# Patient Record
Sex: Female | Born: 1985
Health system: Southern US, Community
[De-identification: ages and names within clinical notes are randomized; demographics above are authoritative.]

## PROBLEM LIST (undated history)

## (undated) DIAGNOSIS — E039 Hypothyroidism, unspecified: Secondary | ICD-10-CM

## (undated) DIAGNOSIS — F32A Depression, unspecified: Secondary | ICD-10-CM

## (undated) DIAGNOSIS — T4145XA Adverse effect of unspecified anesthetic, initial encounter: Secondary | ICD-10-CM

## (undated) DIAGNOSIS — T8859XA Other complications of anesthesia, initial encounter: Secondary | ICD-10-CM

## (undated) DIAGNOSIS — F419 Anxiety disorder, unspecified: Secondary | ICD-10-CM

## (undated) DIAGNOSIS — I10 Essential (primary) hypertension: Secondary | ICD-10-CM

## (undated) DIAGNOSIS — K219 Gastro-esophageal reflux disease without esophagitis: Secondary | ICD-10-CM

## (undated) DIAGNOSIS — F329 Major depressive disorder, single episode, unspecified: Secondary | ICD-10-CM

## (undated) DIAGNOSIS — Z87828 Personal history of other (healed) physical injury and trauma: Secondary | ICD-10-CM

## (undated) HISTORY — DX: Hypothyroidism, unspecified: E03.9

## (undated) HISTORY — DX: Personal history of other (healed) physical injury and trauma: Z87.828

## (undated) HISTORY — PX: LAPAROSCOPIC OVARIAN CYSTECTOMY: SUR786

## (undated) HISTORY — DX: Essential (primary) hypertension: I10

## (undated) HISTORY — PX: WISDOM TOOTH EXTRACTION: SHX21

---
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
## 2005-02-01 ENCOUNTER — Ambulatory Visit: Payer: Self-pay | Admitting: "Endocrinology

## 2005-02-20 ENCOUNTER — Ambulatory Visit: Payer: Self-pay | Admitting: "Endocrinology

## 2005-04-17 ENCOUNTER — Ambulatory Visit: Payer: Self-pay | Admitting: "Endocrinology

## 2005-05-24 ENCOUNTER — Other Ambulatory Visit: Admission: RE | Admit: 2005-05-24 | Discharge: 2005-05-24 | Payer: Self-pay | Admitting: Obstetrics and Gynecology

## 2005-07-24 ENCOUNTER — Ambulatory Visit: Payer: Self-pay | Admitting: "Endocrinology

## 2005-10-02 ENCOUNTER — Ambulatory Visit: Payer: Self-pay | Admitting: "Endocrinology

## 2012-12-11 ENCOUNTER — Emergency Department: Payer: Self-pay | Admitting: Emergency Medicine

## 2012-12-11 LAB — CBC
HGB: 14 g/dL (ref 12.0–16.0)
MCH: 27.8 pg (ref 26.0–34.0)
MCHC: 32.2 g/dL (ref 32.0–36.0)
RBC: 5.05 10*6/uL (ref 3.80–5.20)
RDW: 15.1 % — ABNORMAL HIGH (ref 11.5–14.5)

## 2012-12-11 LAB — COMPREHENSIVE METABOLIC PANEL
Albumin: 3.6 g/dL (ref 3.4–5.0)
Anion Gap: 10 (ref 7–16)
BUN: 15 mg/dL (ref 7–18)
Bilirubin,Total: 0.1 mg/dL — ABNORMAL LOW (ref 0.2–1.0)
Calcium, Total: 9.1 mg/dL (ref 8.5–10.1)
Chloride: 103 mmol/L (ref 98–107)
EGFR (African American): >60
Glucose: 125 mg/dL — ABNORMAL HIGH (ref 65–99)
Osmolality: 280 (ref 275–301)
Potassium: 3.5 mmol/L (ref 3.5–5.1)
SGOT(AST): 12 U/L — ABNORMAL LOW (ref 15–37)
SGPT (ALT): 19 U/L (ref 12–78)
Sodium: 139 mmol/L (ref 136–145)

## 2012-12-11 LAB — URINALYSIS, COMPLETE
Bilirubin,UR: NEGATIVE
Glucose,UR: NEGATIVE mg/dL (ref 0–75)
Nitrite: NEGATIVE
Protein: NEGATIVE
RBC,UR: 71 /HPF (ref 0–5)
Squamous Epithelial: 3
WBC UR: 2 /HPF (ref 0–5)

## 2012-12-11 LAB — PREGNANCY, URINE: Pregnancy Test, Urine: NEGATIVE m[IU]/mL

## 2012-12-14 LAB — COMPREHENSIVE METABOLIC PANEL
Alkaline Phosphatase: 86 U/L (ref 50–136)
BUN: 15 mg/dL (ref 7–18)
Bilirubin,Total: 0.3 mg/dL (ref 0.2–1.0)
Calcium, Total: 8.5 mg/dL (ref 8.5–10.1)
Chloride: 104 mmol/L (ref 98–107)
Co2: 18 mmol/L — ABNORMAL LOW (ref 21–32)
Glucose: 106 mg/dL — ABNORMAL HIGH (ref 65–99)
Osmolality: 269 (ref 275–301)
Potassium: 4.6 mmol/L (ref 3.5–5.1)
SGOT(AST): 34 U/L (ref 15–37)
SGPT (ALT): 19 U/L (ref 12–78)
Sodium: 134 mmol/L — ABNORMAL LOW (ref 136–145)
Total Protein: 8.4 g/dL — ABNORMAL HIGH (ref 6.4–8.2)

## 2014-05-26 LAB — OB RESULTS CONSOLE ABO/RH: RH TYPE: POSITIVE

## 2014-05-26 LAB — OB RESULTS CONSOLE GC/CHLAMYDIA
Chlamydia: NEGATIVE
GC PROBE AMP, GENITAL: NEGATIVE

## 2014-05-26 LAB — OB RESULTS CONSOLE RPR: RPR: NONREACTIVE

## 2014-05-26 LAB — OB RESULTS CONSOLE HIV ANTIBODY (ROUTINE TESTING): HIV: NONREACTIVE

## 2014-05-26 LAB — OB RESULTS CONSOLE RUBELLA ANTIBODY, IGM: Rubella: IMMUNE

## 2014-05-26 LAB — OB RESULTS CONSOLE HEPATITIS B SURFACE ANTIGEN: Hepatitis B Surface Ag: NEGATIVE

## 2014-05-26 LAB — OB RESULTS CONSOLE ANTIBODY SCREEN: Antibody Screen: NEGATIVE

## 2014-07-21 ENCOUNTER — Observation Stay: Payer: Self-pay | Admitting: Obstetrics and Gynecology

## 2014-07-21 LAB — CBC WITH DIFFERENTIAL/PLATELET
BASOS ABS: 0 10*3/uL (ref 0.0–0.1)
BASOS PCT: 0.5 %
EOS ABS: 0.2 10*3/uL (ref 0.0–0.7)
Eosinophil %: 1.7 %
HCT: 32.2 % — ABNORMAL LOW (ref 35.0–47.0)
HGB: 10.5 g/dL — AB (ref 12.0–16.0)
LYMPHS PCT: 16.5 %
Lymphocyte #: 1.6 10*3/uL (ref 1.0–3.6)
MCH: 29.3 pg (ref 26.0–34.0)
MCHC: 32.6 g/dL (ref 32.0–36.0)
MCV: 90 fL (ref 80–100)
Monocyte #: 0.7 x10 3/mm (ref 0.2–0.9)
Monocyte %: 7.2 %
NEUTROS PCT: 74.1 %
Neutrophil #: 7.1 10*3/uL — ABNORMAL HIGH (ref 1.4–6.5)
PLATELETS: 251 10*3/uL (ref 150–440)
RBC: 3.57 10*6/uL — ABNORMAL LOW (ref 3.80–5.20)
RDW: 15.7 % — ABNORMAL HIGH (ref 11.5–14.5)
WBC: 9.6 10*3/uL (ref 3.6–11.0)

## 2014-07-21 LAB — URINALYSIS, COMPLETE
BILIRUBIN, UR: NEGATIVE
BLOOD: NEGATIVE
GLUCOSE, UR: NEGATIVE mg/dL (ref 0–75)
KETONE: NEGATIVE
Leukocyte Esterase: NEGATIVE
Nitrite: NEGATIVE
Ph: 6 (ref 4.5–8.0)
Protein: NEGATIVE
RBC,UR: 1 /HPF (ref 0–5)
Specific Gravity: 1.004 (ref 1.003–1.030)
Squamous Epithelial: 3
WBC UR: 2 /HPF (ref 0–5)

## 2014-07-21 LAB — COMPREHENSIVE METABOLIC PANEL
ALBUMIN: 2.6 g/dL — AB (ref 3.4–5.0)
ALT: 16 U/L
Alkaline Phosphatase: 74 U/L
Anion Gap: 7 (ref 7–16)
BUN: 4 mg/dL — ABNORMAL LOW (ref 7–18)
Bilirubin,Total: 0.3 mg/dL (ref 0.2–1.0)
CALCIUM: 8.2 mg/dL — AB (ref 8.5–10.1)
Chloride: 110 mmol/L — ABNORMAL HIGH (ref 98–107)
Co2: 23 mmol/L (ref 21–32)
Creatinine: 0.51 mg/dL — ABNORMAL LOW (ref 0.60–1.30)
EGFR (Non-African Amer.): >60
GLUCOSE: 80 mg/dL (ref 65–99)
Osmolality: 275 (ref 275–301)
Potassium: 4 mmol/L (ref 3.5–5.1)
SGOT(AST): 19 U/L (ref 15–37)
Sodium: 140 mmol/L (ref 136–145)
Total Protein: 6.6 g/dL (ref 6.4–8.2)

## 2014-07-21 LAB — LIPASE, BLOOD: Lipase: 49 U/L — ABNORMAL LOW (ref 73–393)

## 2014-07-21 LAB — AMYLASE: Amylase: 25 U/L (ref 25–115)

## 2014-08-02 ENCOUNTER — Other Ambulatory Visit (HOSPITAL_COMMUNITY): Payer: Self-pay | Admitting: Obstetrics and Gynecology

## 2014-08-02 DIAGNOSIS — O10912 Unspecified pre-existing hypertension complicating pregnancy, second trimester: Secondary | ICD-10-CM

## 2014-08-03 ENCOUNTER — Ambulatory Visit (HOSPITAL_COMMUNITY)
Admission: RE | Admit: 2014-08-03 | Discharge: 2014-08-03 | Disposition: A | Discharge status: Home / Self Care | Payer: 59 | Source: Ambulatory Visit | Attending: Obstetrics and Gynecology | Admitting: Obstetrics and Gynecology

## 2014-08-03 ENCOUNTER — Encounter (HOSPITAL_COMMUNITY): Payer: Self-pay

## 2014-08-03 VITALS — BP 133/72 | HR 80 | Wt 292.0 lb

## 2014-08-03 DIAGNOSIS — O99212 Obesity complicating pregnancy, second trimester: Secondary | ICD-10-CM | POA: Insufficient documentation

## 2014-08-03 DIAGNOSIS — Z3A23 23 weeks gestation of pregnancy: Secondary | ICD-10-CM | POA: Diagnosis not present

## 2014-08-03 DIAGNOSIS — O10012 Pre-existing essential hypertension complicating pregnancy, second trimester: Secondary | ICD-10-CM | POA: Diagnosis present

## 2014-08-03 DIAGNOSIS — O1003 Pre-existing essential hypertension complicating the puerperium: Secondary | ICD-10-CM | POA: Diagnosis not present

## 2014-08-03 DIAGNOSIS — O10912 Unspecified pre-existing hypertension complicating pregnancy, second trimester: Secondary | ICD-10-CM

## 2014-08-03 HISTORY — DX: Essential (primary) hypertension: I10

## 2014-08-03 IMAGING — US US OB DETAIL+14 WK
1 series · 12 of 28 positions shown · non-contrast
Comparison: none

[Series 1: us ob detail+14 wk · 0.11mm/px · 12 of 77 slices shown]
[im 3/77]
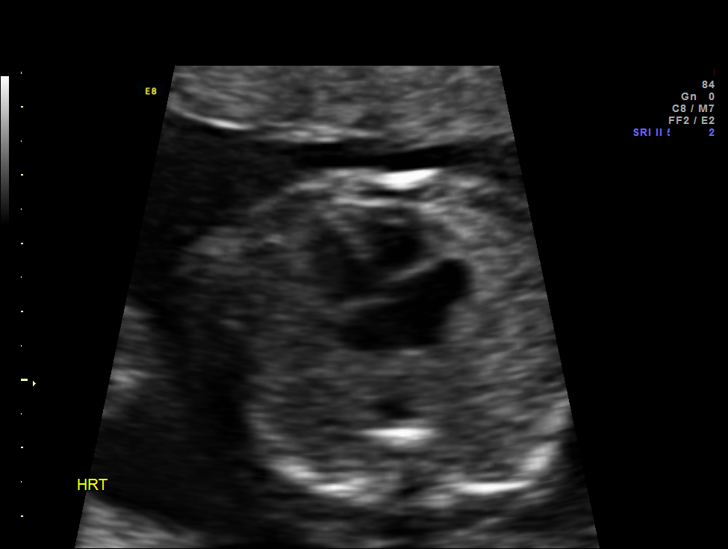
[im 9/77]
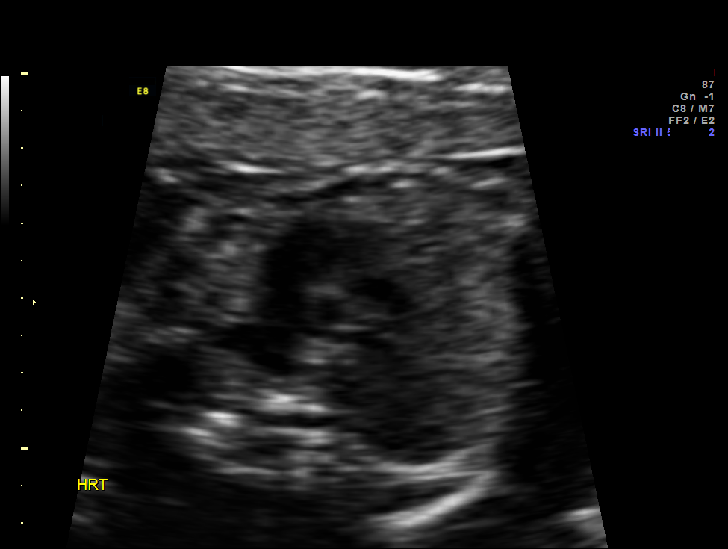
[im 15/77]
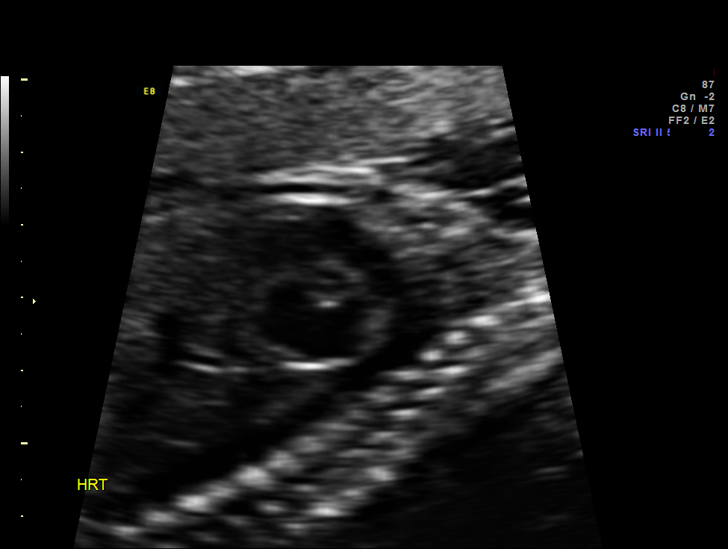
[im 23/77]
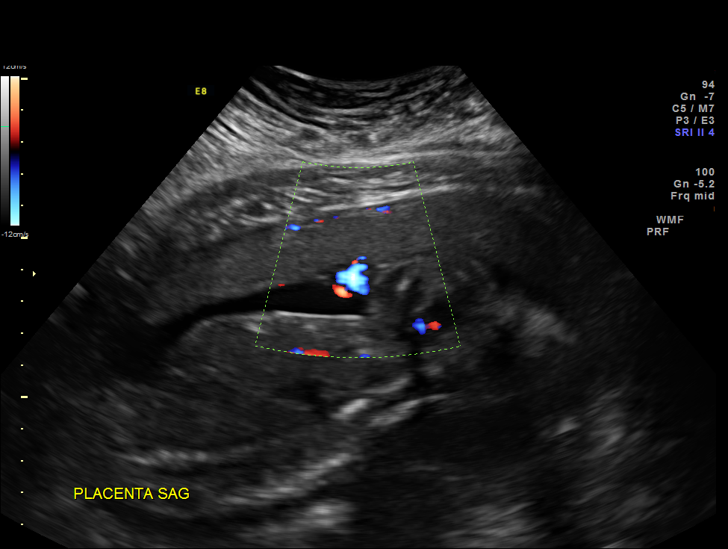
[im 29/77]
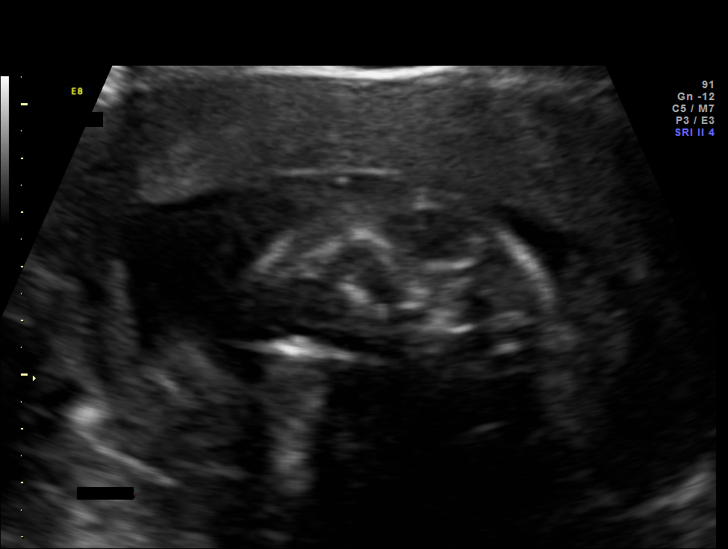
[im 34/77]
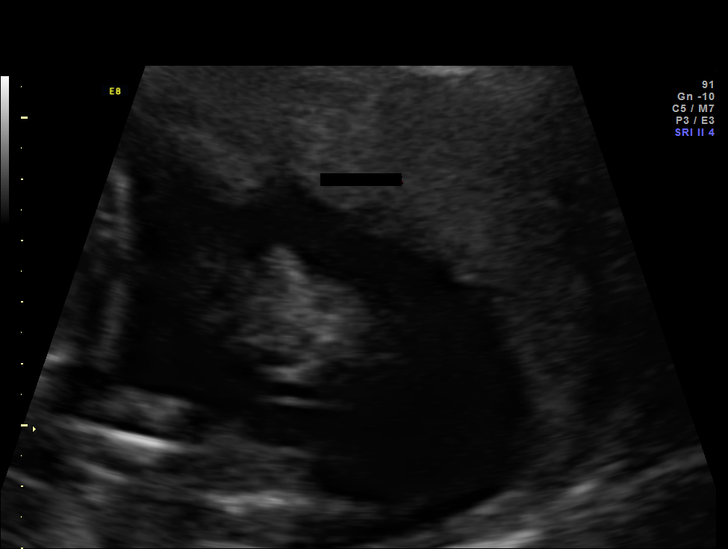
[im 43/77]
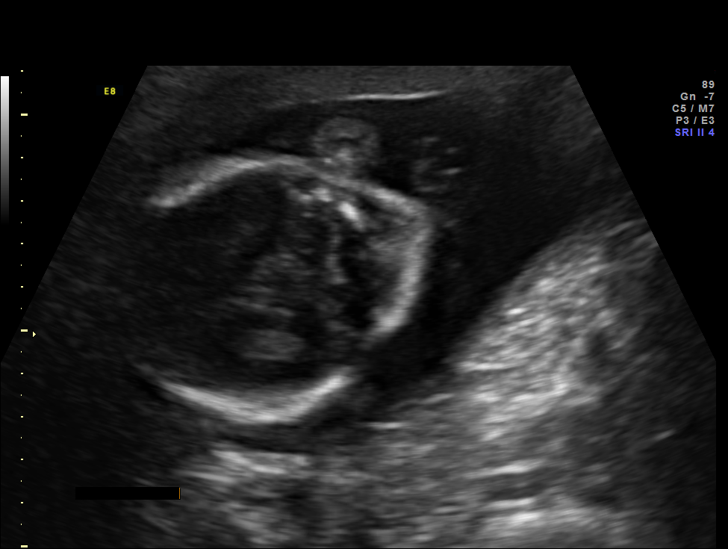
[im 48/77]
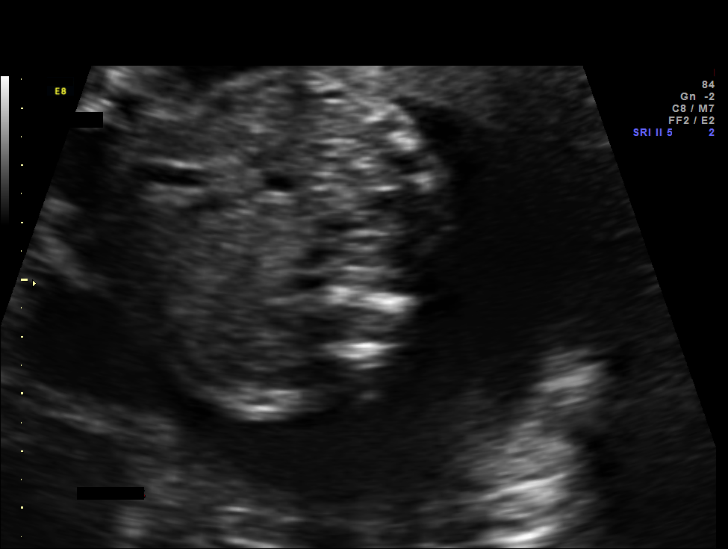
[im 54/77]
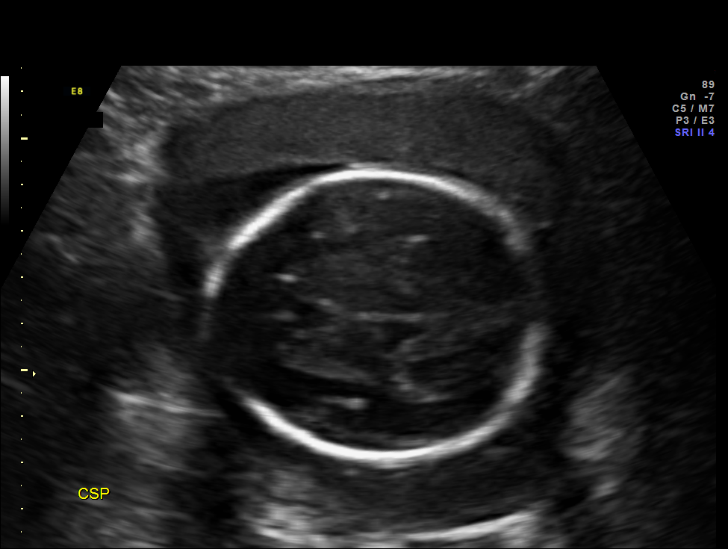
[im 62/77]
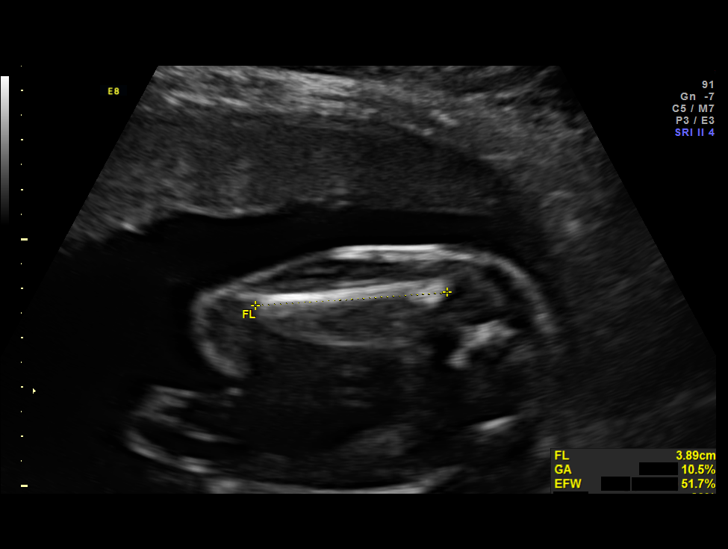
[im 68/77]
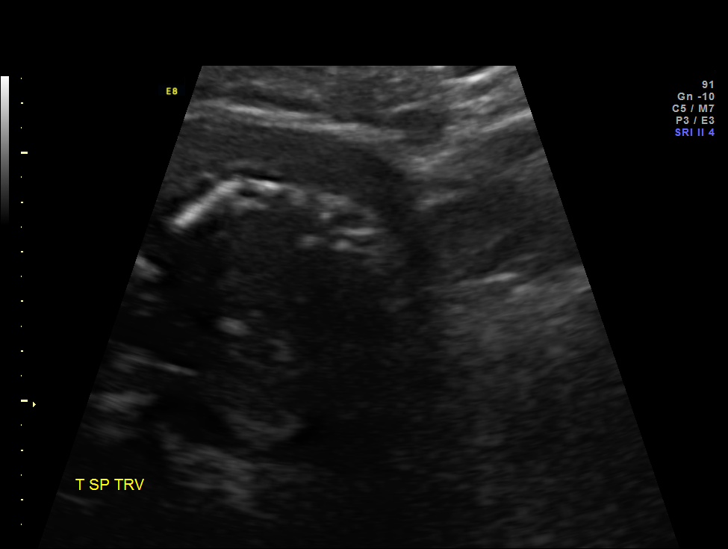
[im 74/77]
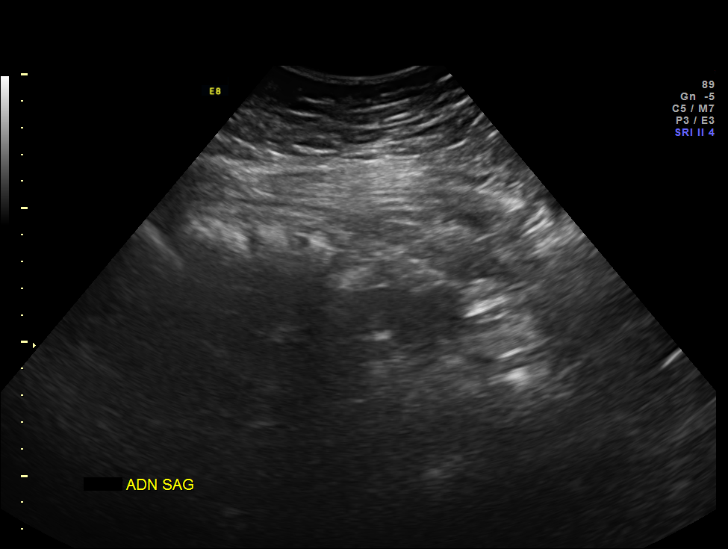

[12 of 28 positions shown; findings below may reference images not displayed]

OBSTETRICS REPORT
                      (Signed Final [DATE] [DATE])

Service(s) Provided

 US OB DETAIL + 14 WK                                  76811.0
Indications

 Detailed fetal anatomic survey                        Z36
 Hypertension - Chronic/Pre-existing (on labetalol)    [D3]
 Obesity complicating pregnancy                        [D3] [D3]
 23 weeks gestation of pregnancy
Fetal Evaluation

 Num Of Fetuses:    1
 Fetal Heart Rate:  159                          bpm
 Cardiac Activity:  Observed
 Presentation:      Cephalic
 Placenta:          Anterior, above cervical os
 P. Cord            Visualized, central
 Insertion:

 Amniotic Fluid
 AFI FV:      Subjectively within normal limits
                                             Larg Pckt:     5.0  cm
Biometry

 BPD:     60.9  mm     G. Age:  24w 5d                CI:        74.94   70 - 86
                                                      FL/HC:      17.6   18.7 -

 HC:     223.2  mm     G. Age:  24w 2d       64  %    HC/AC:      1.14   1.05 -

 AC:     196.2  mm     G. Age:  24w 2d       64  %    FL/BPD:     64.5   71 - 87
 FL:      39.3  mm     G. Age:  22w 4d       14  %    FL/AC:      20.0   20 - 24
 HUM:       41  mm     G. Age:  24w 6d       71  %
 CER:     25.5  mm     G. Age:  23w 4d       49  %

 Est. FW:     621  gm      1 lb 6 oz     55  %
Gestational Age

 U/S Today:     24w 0d                                        EDD:   [DATE]
 Best:          23w 4d     Det. By:  Early Ultrasound         EDD:   [DATE]
                                     ([DATE])
Anatomy
 Cranium:          Appears normal         Aortic Arch:      Appears normal
 Fetal Cavum:      Appears normal         Ductal Arch:      Appears normal
 Ventricles:       Appears normal         Diaphragm:        Appears normal
 Choroid Plexus:   Appears normal         Stomach:          Appears normal, left
                                                            sided
 Cerebellum:       Appears normal         Abdomen:          Appears normal
 Posterior Fossa:  Appears normal         Abdominal Wall:   Appears nml (cord
                                                            insert, abd wall)
 Nuchal Fold:      Not applicable (>20    Cord Vessels:     Appears normal (3
                   wks GA)                                  vessel cord)
 Face:             Appears normal         Kidneys:          Appear normal
                   (orbits and profile)
 Lips:             Appears normal         Bladder:          Appears normal
 Heart:            Echogenic focus        Spine:            Appears normal
                   in LV; limited
 RVOT:             Appears normal         Lower             Visualized
                                          Extremities:
 LVOT:             Appears normal         Upper             Visualized
                                          Extremities:

 Other:  Fetus appears to be a female. Nasal bone visualized. Technically
         difficult due to maternal habitus and fetal position.
Cervix Uterus Adnexa

 Cervical Length:    3        cm

 Cervix:       Normal appearance by transabdominal scan.

 Adnexa:     No abnormality visualized.
Comments

 An echogenic focus was seen in the left cardiac ventricle.
 This is felt to represent a calcified papillary muscle, and is not
 associated with structural or functional cardiac abnormalities.
 Although an echogenic cardiac focus may be associated with
 an increased risk of Down syndrome, this risk is felt to be
 minimal, especially when it is seen as an isolated finding.
Impression

 Single IUP at 23w 4d
 Hx of Chronic hypertension on Labetalol
 An echogenic intracardiac focus was noted (see comments)
 Somewhat limited views of the fetal heart obtained due to
 maternal body habitus and fetal position
 Anterior placenta without previa
 Normal amniotic fluid volume
Recommendations

 Recommend follow-up ultrasound examination in 4 weeks for
 growth and to reevaluate the fetal heart

## 2014-08-15 ENCOUNTER — Encounter (HOSPITAL_COMMUNITY): Payer: Self-pay

## 2014-08-23 ENCOUNTER — Other Ambulatory Visit (HOSPITAL_COMMUNITY): Payer: Self-pay

## 2014-08-25 ENCOUNTER — Observation Stay: Payer: Self-pay | Admitting: Obstetrics & Gynecology

## 2014-08-25 LAB — COMPREHENSIVE METABOLIC PANEL
ALBUMIN: 2.4 g/dL — AB (ref 3.4–5.0)
ALT: 13 U/L — AB
AST: 8 U/L — AB (ref 15–37)
Alkaline Phosphatase: 77 U/L
Anion Gap: 7 (ref 7–16)
BILIRUBIN TOTAL: 0.2 mg/dL (ref 0.2–1.0)
BUN: 5 mg/dL — ABNORMAL LOW (ref 7–18)
CALCIUM: 7.9 mg/dL — AB (ref 8.5–10.1)
CHLORIDE: 111 mmol/L — AB (ref 98–107)
Co2: 22 mmol/L (ref 21–32)
Creatinine: 0.53 mg/dL — ABNORMAL LOW (ref 0.60–1.30)
EGFR (Non-African Amer.): >60
Glucose: 87 mg/dL (ref 65–99)
OSMOLALITY: 276 (ref 275–301)
POTASSIUM: 3.6 mmol/L (ref 3.5–5.1)
Sodium: 140 mmol/L (ref 136–145)
Total Protein: 6.3 g/dL — ABNORMAL LOW (ref 6.4–8.2)

## 2014-08-25 LAB — URINALYSIS, COMPLETE
BLOOD: NEGATIVE
Bilirubin,UR: NEGATIVE
Glucose,UR: NEGATIVE mg/dL (ref 0–75)
KETONE: NEGATIVE
Leukocyte Esterase: NEGATIVE
Nitrite: NEGATIVE
Ph: 6 (ref 4.5–8.0)
RBC,UR: 1 /HPF (ref 0–5)
Specific Gravity: 1.011 (ref 1.003–1.030)
Squamous Epithelial: 3

## 2014-08-25 LAB — CBC WITH DIFFERENTIAL/PLATELET
BASOS PCT: 0.5 %
Basophil #: 0 10*3/uL (ref 0.0–0.1)
EOS ABS: 0.1 10*3/uL (ref 0.0–0.7)
EOS PCT: 0.9 %
HCT: 33 % — ABNORMAL LOW (ref 35.0–47.0)
HGB: 11 g/dL — AB (ref 12.0–16.0)
LYMPHS ABS: 0.9 10*3/uL — AB (ref 1.0–3.6)
Lymphocyte %: 12.7 %
MCH: 30.4 pg (ref 26.0–34.0)
MCHC: 33.4 g/dL (ref 32.0–36.0)
MCV: 91 fL (ref 80–100)
Monocyte #: 0.6 x10 3/mm (ref 0.2–0.9)
Monocyte %: 8.8 %
NEUTROS PCT: 77.1 %
Neutrophil #: 5.6 10*3/uL (ref 1.4–6.5)
Platelet: 242 10*3/uL (ref 150–440)
RBC: 3.62 10*6/uL — ABNORMAL LOW (ref 3.80–5.20)
RDW: 14.9 % — AB (ref 11.5–14.5)
WBC: 7.3 10*3/uL (ref 3.6–11.0)

## 2014-08-25 LAB — LIPASE, BLOOD: LIPASE: 50 U/L — AB (ref 73–393)

## 2014-08-25 LAB — HCG, QUANTITATIVE, PREGNANCY: BETA HCG, QUANT.: 3796 m[IU]/mL — AB

## 2014-08-25 LAB — PREGNANCY, URINE: PREGNANCY TEST, URINE: POSITIVE m[IU]/mL
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
## 2014-08-31 ENCOUNTER — Encounter (HOSPITAL_COMMUNITY): Payer: Self-pay

## 2014-08-31 ENCOUNTER — Other Ambulatory Visit (HOSPITAL_COMMUNITY): Payer: Self-pay | Admitting: Maternal and Fetal Medicine

## 2014-08-31 ENCOUNTER — Ambulatory Visit (HOSPITAL_COMMUNITY)
Admission: RE | Admit: 2014-08-31 | Discharge: 2014-08-31 | Disposition: A | Discharge status: Home / Self Care | Payer: 59 | Source: Ambulatory Visit | Attending: Obstetrics and Gynecology | Admitting: Obstetrics and Gynecology

## 2014-08-31 DIAGNOSIS — O99212 Obesity complicating pregnancy, second trimester: Secondary | ICD-10-CM | POA: Insufficient documentation

## 2014-08-31 DIAGNOSIS — O99210 Obesity complicating pregnancy, unspecified trimester: Secondary | ICD-10-CM | POA: Insufficient documentation

## 2014-08-31 DIAGNOSIS — O10912 Unspecified pre-existing hypertension complicating pregnancy, second trimester: Secondary | ICD-10-CM

## 2014-08-31 DIAGNOSIS — Z3A27 27 weeks gestation of pregnancy: Secondary | ICD-10-CM | POA: Insufficient documentation

## 2014-08-31 DIAGNOSIS — O10012 Pre-existing essential hypertension complicating pregnancy, second trimester: Secondary | ICD-10-CM | POA: Diagnosis not present

## 2014-08-31 DIAGNOSIS — O10919 Unspecified pre-existing hypertension complicating pregnancy, unspecified trimester: Secondary | ICD-10-CM | POA: Insufficient documentation

## 2014-08-31 IMAGING — US US OB FOLLOW-UP
1 series · 12 of 28 positions shown · non-contrast
Comparison: none

[Series 1: us ob follow-up · 0.33mm/px · 12 of 28 slices shown]
[im 2/28]
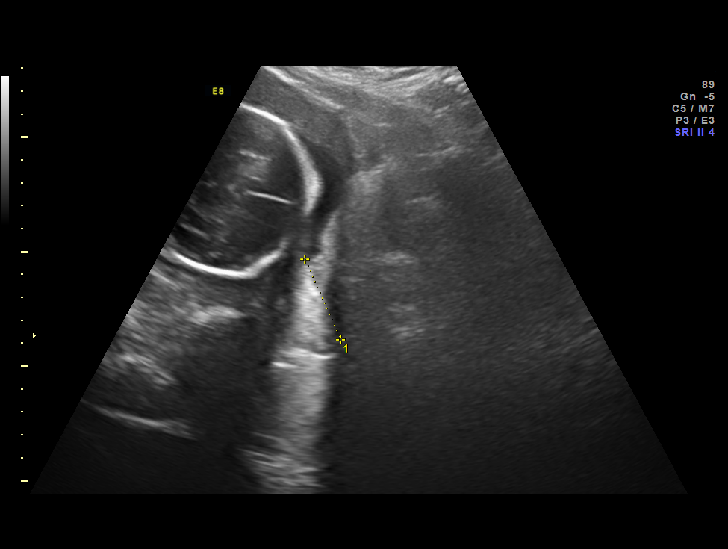
[im 4/28]
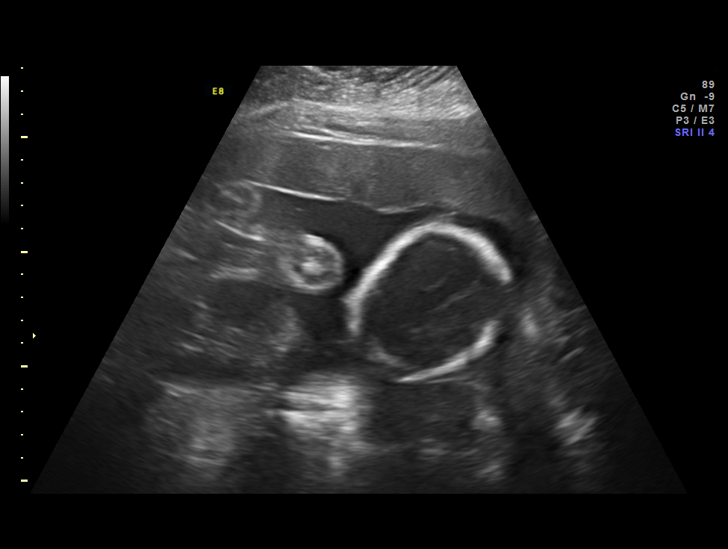
[im 6/28]
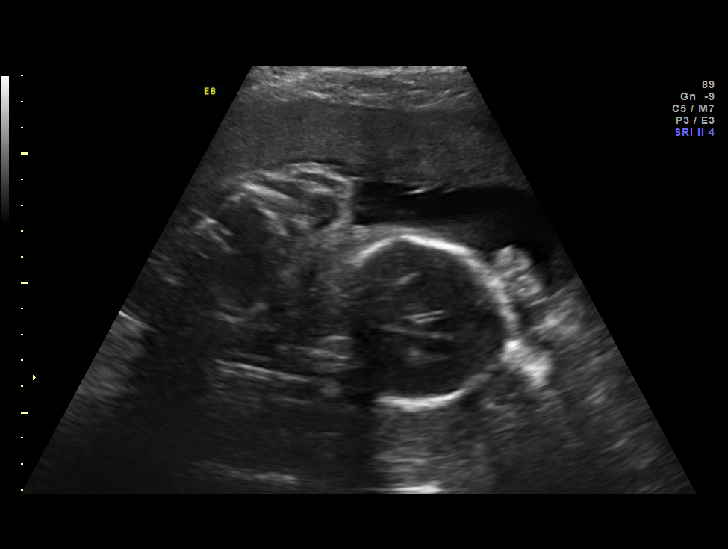
[im 9/28]
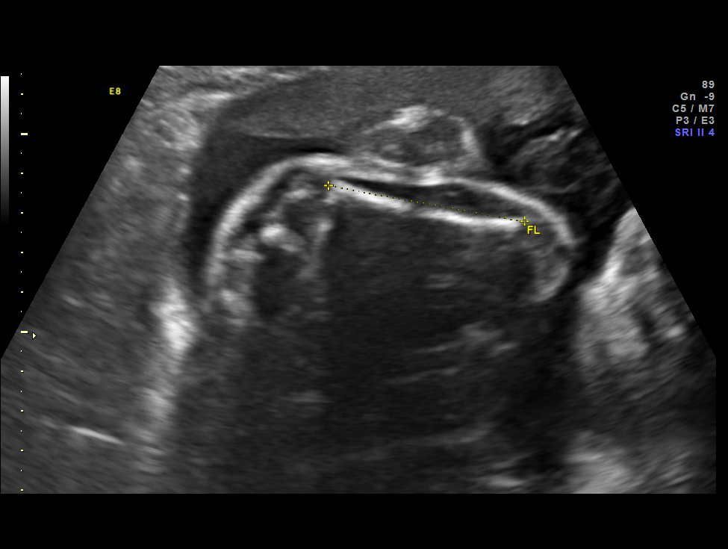
[im 11/28]
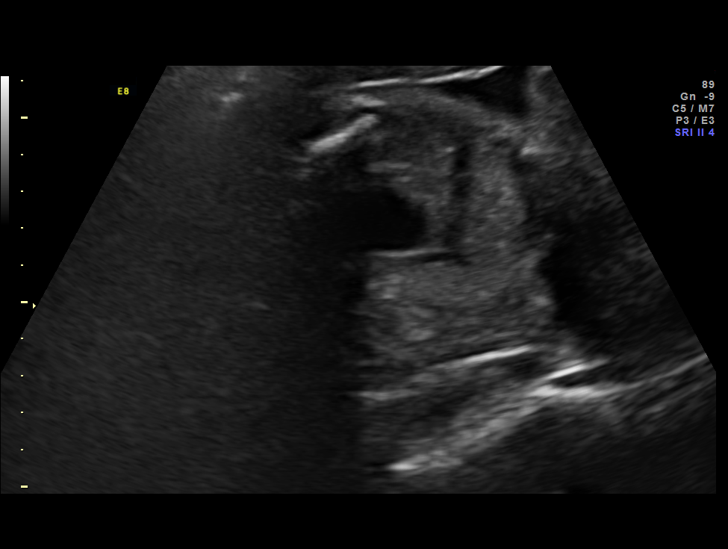
[im 13/28]
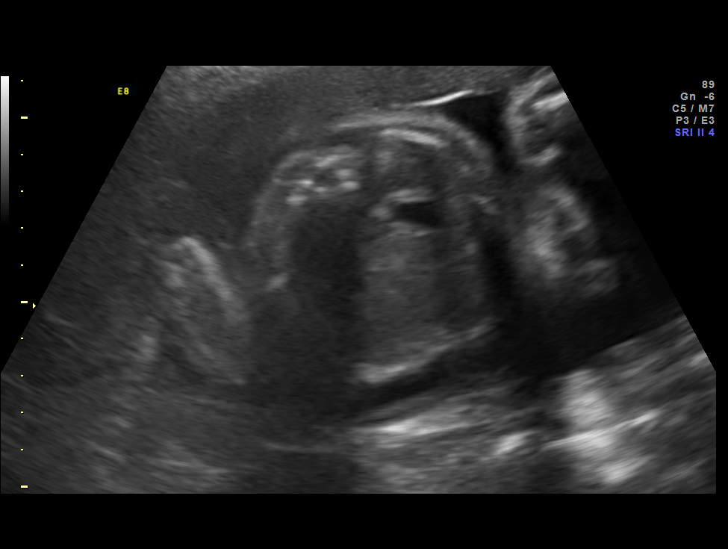
[im 16/28]
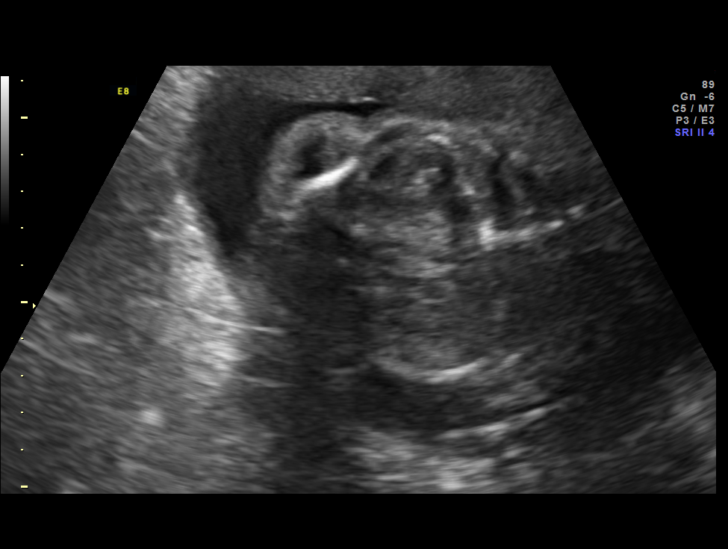
[im 18/28]
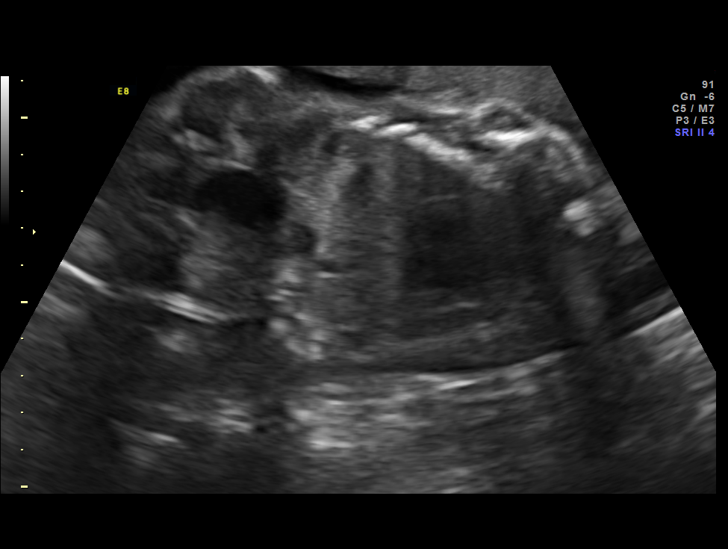
[im 20/28]
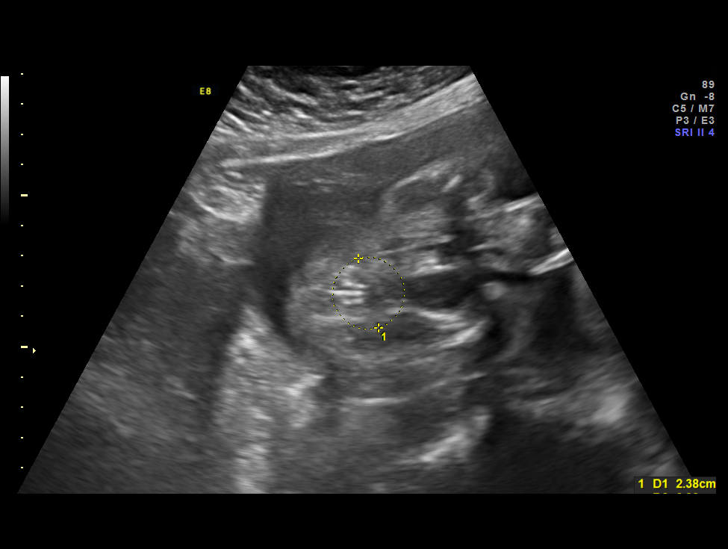
[im 23/28]
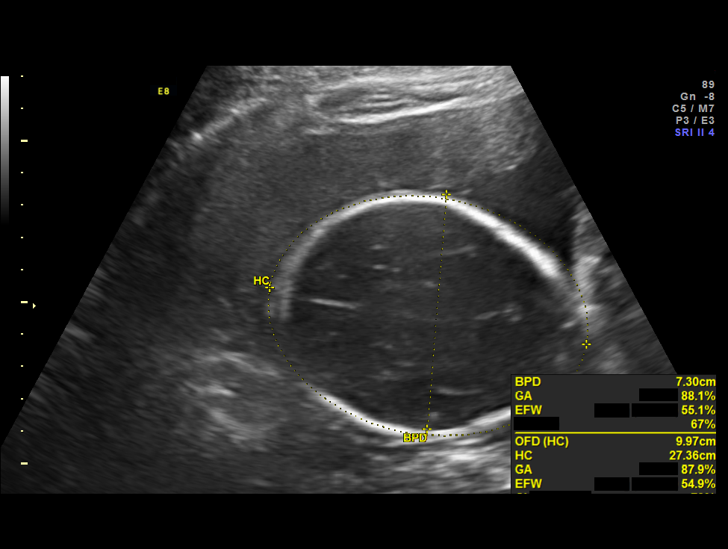
[im 25/28]
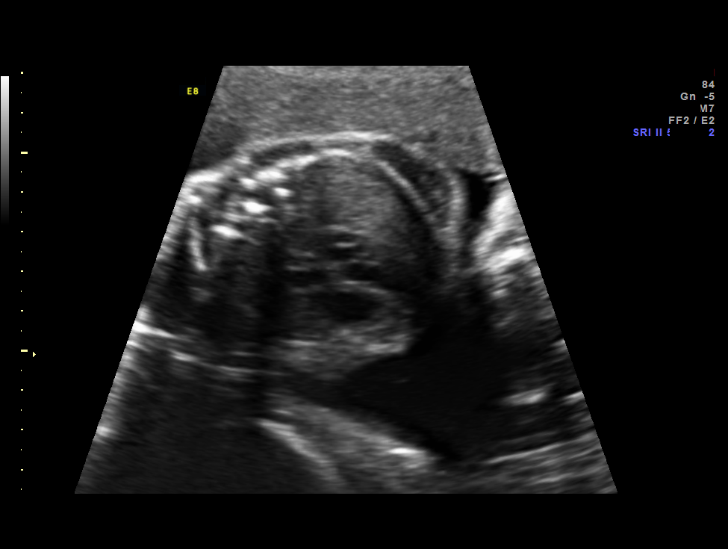
[im 27/28]
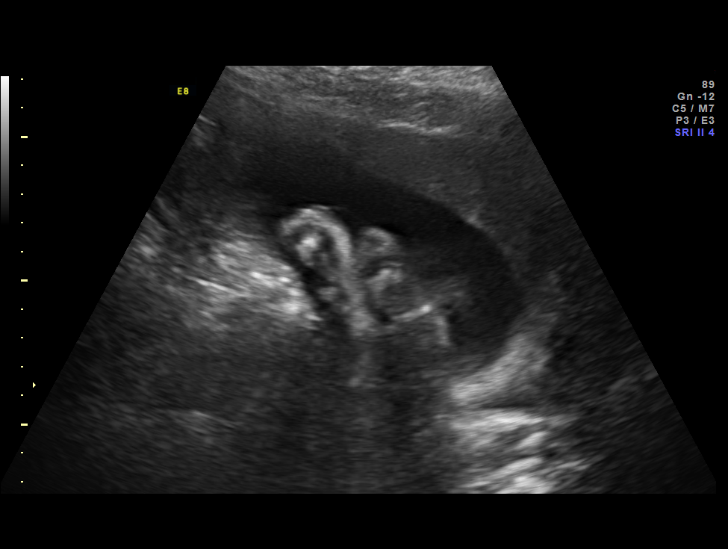

[12 of 28 positions shown; findings below may reference images not displayed]

OBSTETRICS REPORT

Service(s) Provided

 US OB FOLLOW UP                                       76816.1
Indications

 Hypertension - Chronic/Pre-existing (on labetalol)    [P1]
 Obesity complicating pregnancy                        [P1] [P1]
 27 weeks gestation of pregnancy
Fetal Evaluation

 Num Of Fetuses:    1
 Fetal Heart Rate:  145                          bpm
 Cardiac Activity:  Observed
 Presentation:      Cephalic
 Placenta:          Anterior, above cervical os
 P. Cord            Previously Visualized
 Insertion:

 Amniotic Fluid
 AFI FV:      Subjectively within normal limits
                                             Larg Pckt:     6.3  cm
Biometry

 BPD:     72.9  mm     G. Age:  29w 2d                CI:         73.4   70 - 86
 OFD:     99.3  mm                                    FL/HC:      18.1   18.8 -

 HC:     274.3  mm     G. Age:  30w 0d       89  %    HC/AC:      1.16   1.05 -

 AC:     236.4  mm     G. Age:  28w 0d       54  %    FL/BPD:     68.0   71 - 87
 FL:      49.6  mm     G. Age:  26w 5d       15  %    FL/AC:      21.0   20 - 24
 HUM:     48.9  mm     G. Age:  28w 6d       70  %

 Est. FW:    [P1]  gm      2 lb 8 oz     57  %
Gestational Age

 U/S Today:     28w 4d                                        EDD:   [DATE]
 Best:          27w 4d     Det. By:  Early Ultrasound         EDD:   [DATE]
                                     ([DATE])
Anatomy

 Cranium:          Previously seen        Aortic Arch:      Previously seen
 Fetal Cavum:      Previously seen        Ductal Arch:      Previously seen
 Ventricles:       Appears normal         Diaphragm:        Previously seen
 Choroid Plexus:   Previously seen        Stomach:          Appears normal, left
                                                            sided
 Cerebellum:       Previously seen        Abdomen:          Previously seen
 Posterior Fossa:  Previously seen        Abdominal Wall:   Previously seen
 Nuchal Fold:      Not applicable (>20    Cord Vessels:     Previously seen
                   wks GA)
 Face:             Orbits and profile     Kidneys:          Appear normal
                   previously seen
 Lips:             Previously seen        Bladder:          Appears normal
 Heart:            Not well visualized    Spine:            Previously seen
 RVOT:             Previously seen        Lower             Previously seen
                                          Extremities:
 LVOT:             Previously seen        Upper             Previously seen
                                          Extremities:

 Other:  Female gender.
Cervix Uterus Adnexa

 Cervical Length:    3.8      cm

 Cervix:       Normal appearance by transabdominal scan. Appears
               closed, without funnelling.
Impression

 Single IUP at 27w 4d
 Hx of Chronic hypertension on Labetalol
 Somewhat limited views of the fetal heart  were again
 obtained due to maternal body habitus and fetal position
 Interval fetal growth is appropriate (57th %tile)
 Anterior placenta without previa
 Normal amniotic fluid volume
Recommendations

 Recommend follow-up ultrasound examination in 4 weeks for
 growth and to complete anatomy
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
## 2014-09-30 ENCOUNTER — Ambulatory Visit (HOSPITAL_COMMUNITY)
Admission: RE | Admit: 2014-09-30 | Discharge: 2014-09-30 | Disposition: A | Discharge status: Home / Self Care | Payer: 59 | Source: Ambulatory Visit | Attending: Maternal and Fetal Medicine | Admitting: Maternal and Fetal Medicine

## 2014-09-30 ENCOUNTER — Other Ambulatory Visit (HOSPITAL_COMMUNITY): Payer: Self-pay | Admitting: Obstetrics and Gynecology

## 2014-09-30 DIAGNOSIS — O10912 Unspecified pre-existing hypertension complicating pregnancy, second trimester: Secondary | ICD-10-CM | POA: Diagnosis present

## 2014-09-30 DIAGNOSIS — O99212 Obesity complicating pregnancy, second trimester: Secondary | ICD-10-CM | POA: Diagnosis not present

## 2014-09-30 DIAGNOSIS — I10 Essential (primary) hypertension: Secondary | ICD-10-CM | POA: Insufficient documentation

## 2014-09-30 DIAGNOSIS — E669 Obesity, unspecified: Secondary | ICD-10-CM | POA: Diagnosis not present

## 2014-09-30 DIAGNOSIS — Z3A31 31 weeks gestation of pregnancy: Secondary | ICD-10-CM | POA: Insufficient documentation

## 2014-09-30 IMAGING — US US OB FOLLOW-UP
1 series · 12 of 28 positions shown · non-contrast
Comparison: none

[Series 1: us ob follow-up · 0.22mm/px · 12 of 39 slices shown]
[im 2/39]
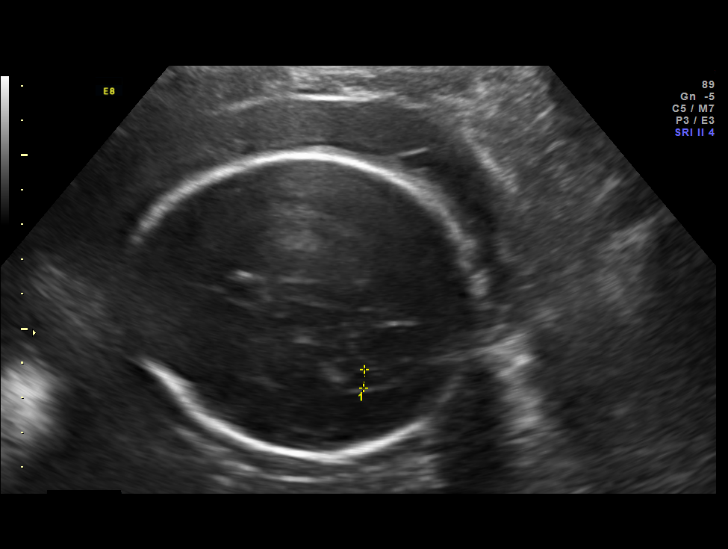
[im 5/39]
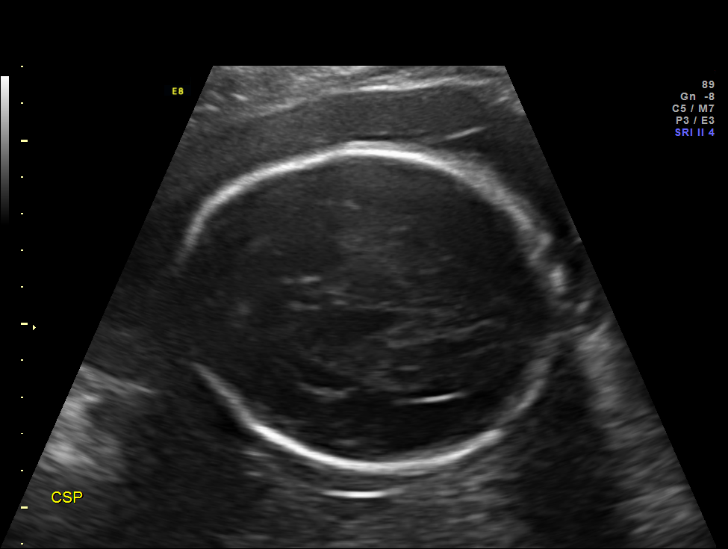
[im 8/39]
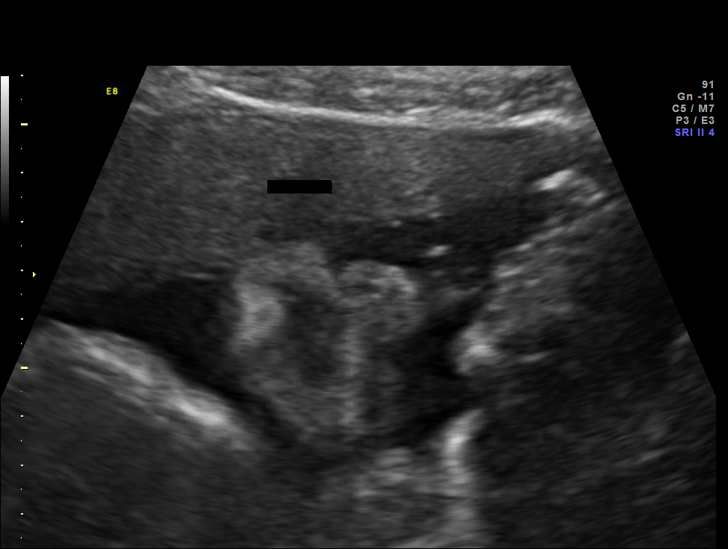
[im 12/39]
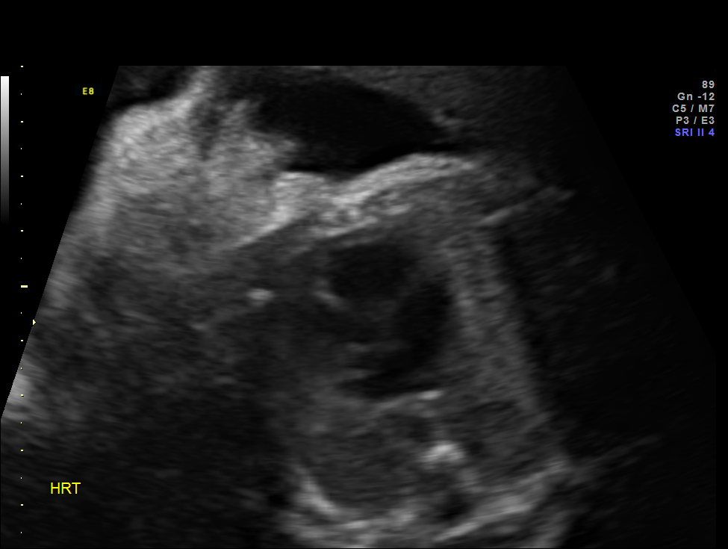
[im 15/39]
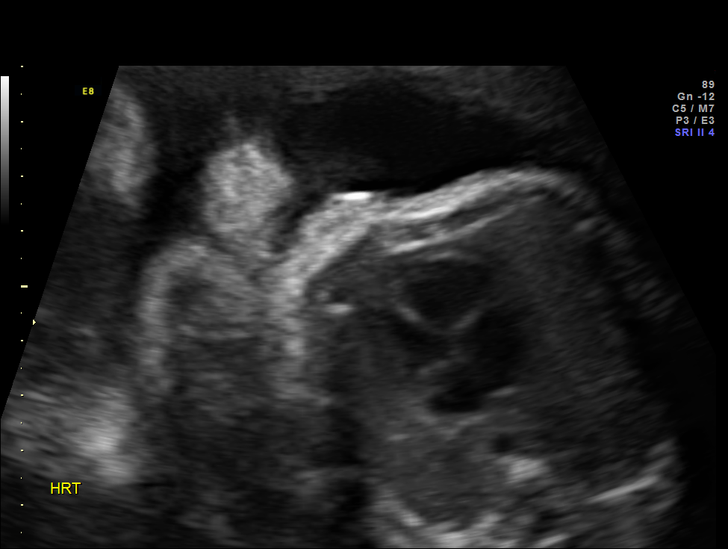
[im 17/39]
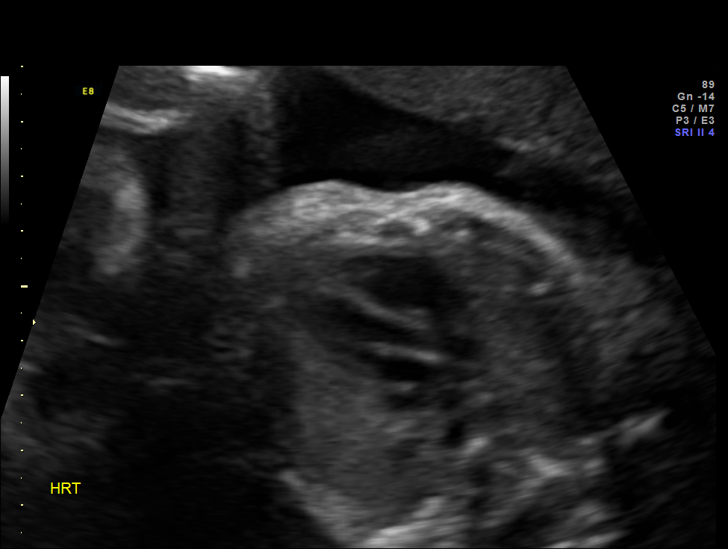
[im 22/39]
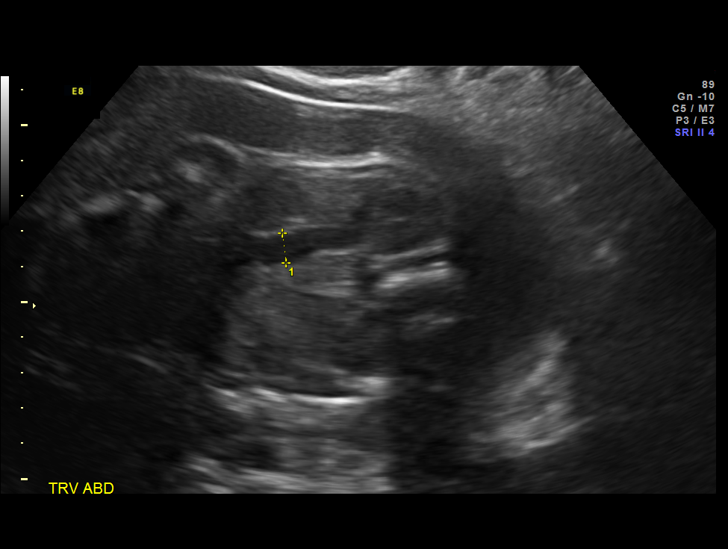
[im 24/39]
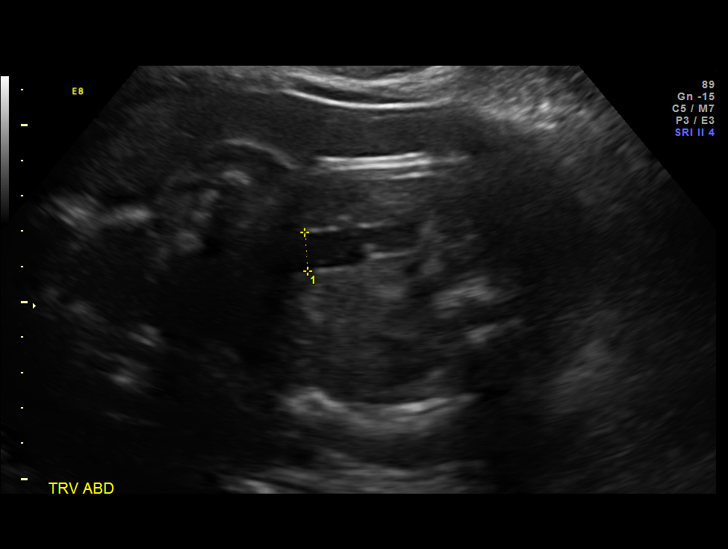
[im 27/39]
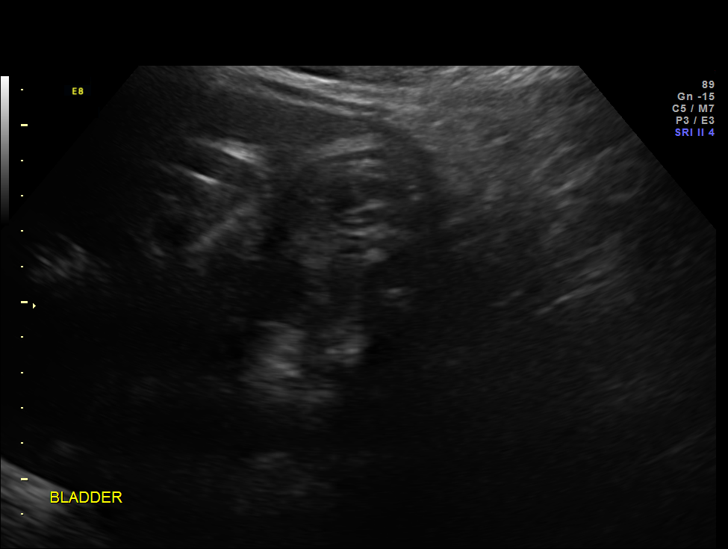
[im 31/39]
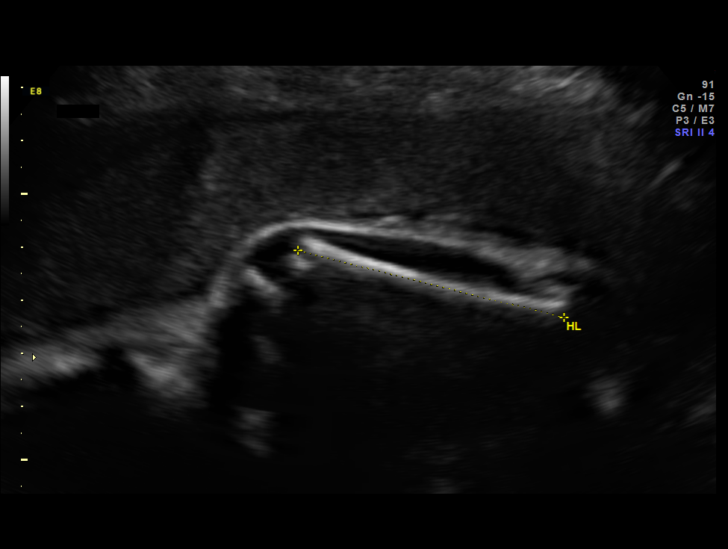
[im 34/39]
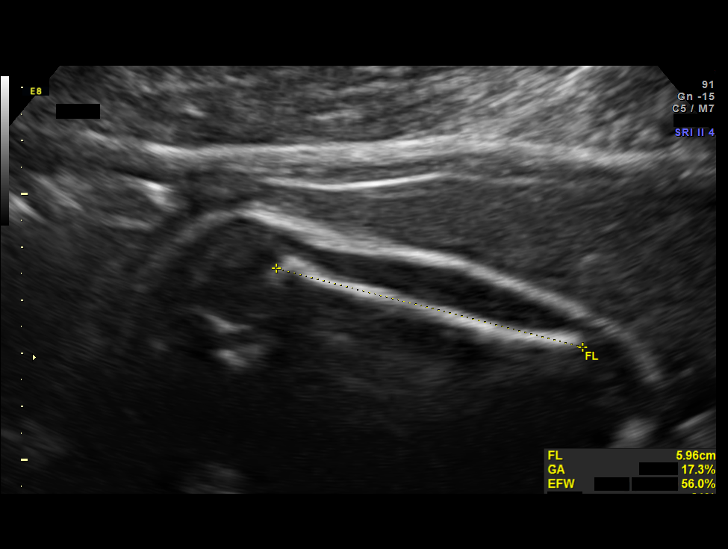
[im 37/39]
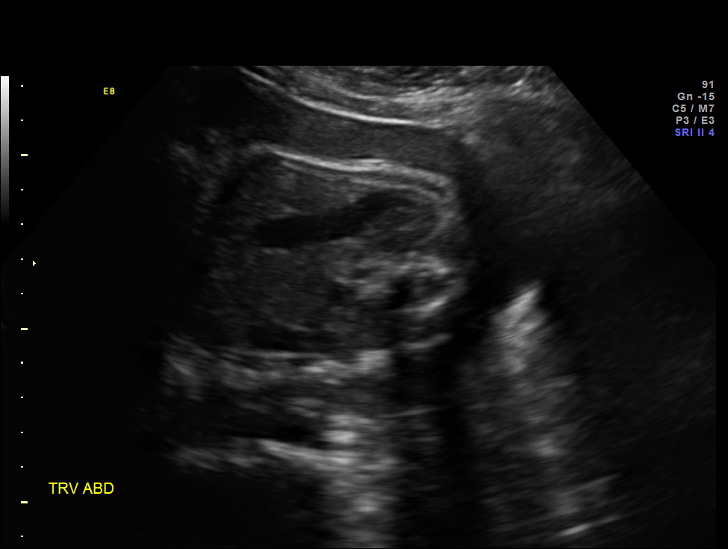

[12 of 28 positions shown; findings below may reference images not displayed]

OBSTETRICS REPORT
                      (Signed Final [DATE] [DATE])

Service(s) Provided

 US OB FOLLOW UP                                       76816.1
Indications

 Hypertension - Chronic/Pre-existing (on labetalol)    [A8]
 Obesity complicating pregnancy                        [A8] [A8]
 Echogenic intracardiac focus of the heart  (EIF) -    [A8]
 Negative QUAD screen
 31 weeks gestation of pregnancy
Fetal Evaluation

 Num Of Fetuses:    1
 Fetal Heart Rate:  138                          bpm
 Cardiac Activity:  Observed
 Presentation:      Cephalic
 Placenta:          Anterior, above cervical os
 P. Cord            Previously Visualized
 Insertion:

 Amniotic Fluid
 AFI FV:      Subjectively within normal limits
 AFI Sum:     14.18   cm       48  %Tile     Larg Pckt:    6.02  cm
 RUQ:   4.23    cm   RLQ:    0      cm    LUQ:   3.93    cm   LLQ:    6.02   cm
Biometry

 BPD:     85.1  mm     G. Age:  34w 2d                CI:         78.4   70 - 86
 OFD:    108.6  mm                                    FL/HC:      19.2   19.1 -

 HC:     308.2  mm     G. Age:  34w 3d       79  %    HC/AC:      1.12   0.96 -

 AC:     275.3  mm     G. Age:  31w 4d       41  %    FL/BPD:     69.6   71 - 87
 FL:      59.2  mm     G. Age:  30w 6d       15  %    FL/AC:      21.5   20 - 24
 HUM:     51.7  mm     G. Age:  30w 1d       16  %
 Est. FW:    [A8]  gm      4 lb 1 oz     56  %
Gestational Age

 U/S Today:     32w 6d                                        EDD:   [DATE]
 Best:          31w 6d     Det. By:  Early Ultrasound         EDD:   [DATE]
                                     ([DATE])
Anatomy

 Cranium:          Appears normal         Aortic Arch:      Previously seen
 Fetal Cavum:      Appears normal         Ductal Arch:      Previously seen
 Ventricles:       Appears normal         Diaphragm:        Previously seen
 Choroid Plexus:   Previously seen        Stomach:          Appears normal, left
                                                            sided
 Cerebellum:       Previously seen        Abdomen:          Dilated bowel
 Posterior Fossa:  Previously seen        Abdominal Wall:   Previously seen
 Nuchal Fold:      Not applicable (>20    Cord Vessels:     Previously seen
                   wks GA)
 Face:             Orbits and profile     Kidneys:          Appear normal
                   previously seen
 Lips:             Previously seen        Bladder:          Appears normal
 Heart:            Appears normal         Spine:            Previously seen
                   (4CH, axis, and
                   situs)
 RVOT:             Previously seen        Lower             Previously seen
                                          Extremities:
 LVOT:             Previously seen        Upper             Previously seen
                                          Extremities:

 Other:  Female gender previously seen. Technically difficult due to maternal
         habitus and fetal position.
Cervix Uterus Adnexa

 Cervix:       Not visualized (advanced GA >[A8])
Impression

 Single IUP at 31w 6d
 CHTN on Labetalol, Obesity
 Interval growth is appropriate (56th %tile)
 An isolated dilated loop of bowel was noted measuring 11
 mm.
 The remainder of the fetal anatomy was within normal limits
 Anterior placenta
 Normal amniotic fluid volume
Recommendations

 Recommend antenatal testing due to CHTN
 Recommend follow-up ultrasound examination in 4 weeks for
 interval growth.  Will reevaluate the isolated loop of bowel at
 that time.

## 2014-10-03 ENCOUNTER — Other Ambulatory Visit (HOSPITAL_COMMUNITY): Payer: Self-pay | Admitting: Obstetrics and Gynecology

## 2014-10-03 DIAGNOSIS — O283 Abnormal ultrasonic finding on antenatal screening of mother: Secondary | ICD-10-CM

## 2014-10-03 DIAGNOSIS — O10013 Pre-existing essential hypertension complicating pregnancy, third trimester: Secondary | ICD-10-CM

## 2014-10-03 DIAGNOSIS — O99213 Obesity complicating pregnancy, third trimester: Secondary | ICD-10-CM

## 2014-10-26 LAB — OB RESULTS CONSOLE GBS: GBS: NEGATIVE

## 2014-10-27 ENCOUNTER — Ambulatory Visit (HOSPITAL_COMMUNITY)
Admission: RE | Admit: 2014-10-27 | Discharge: 2014-10-27 | Disposition: A | Discharge status: Home / Self Care | Payer: 59 | Source: Ambulatory Visit | Attending: Obstetrics and Gynecology | Admitting: Obstetrics and Gynecology

## 2014-10-27 DIAGNOSIS — O283 Abnormal ultrasonic finding on antenatal screening of mother: Secondary | ICD-10-CM | POA: Insufficient documentation

## 2014-10-27 DIAGNOSIS — Z3A35 35 weeks gestation of pregnancy: Secondary | ICD-10-CM | POA: Insufficient documentation

## 2014-10-27 DIAGNOSIS — O10013 Pre-existing essential hypertension complicating pregnancy, third trimester: Secondary | ICD-10-CM | POA: Diagnosis not present

## 2014-10-27 DIAGNOSIS — O99213 Obesity complicating pregnancy, third trimester: Secondary | ICD-10-CM | POA: Diagnosis not present

## 2014-10-27 DIAGNOSIS — O99210 Obesity complicating pregnancy, unspecified trimester: Secondary | ICD-10-CM | POA: Insufficient documentation

## 2014-10-27 DIAGNOSIS — O358XX0 Maternal care for other (suspected) fetal abnormality and damage, not applicable or unspecified: Secondary | ICD-10-CM | POA: Insufficient documentation

## 2014-10-27 IMAGING — US US OB FOLLOW-UP
1 series · 12 of 28 positions shown · non-contrast
Comparison: none

[Series 1: us ob follow-up · 0.30mm/px · 12 of 35 slices shown]
[im 2/35]
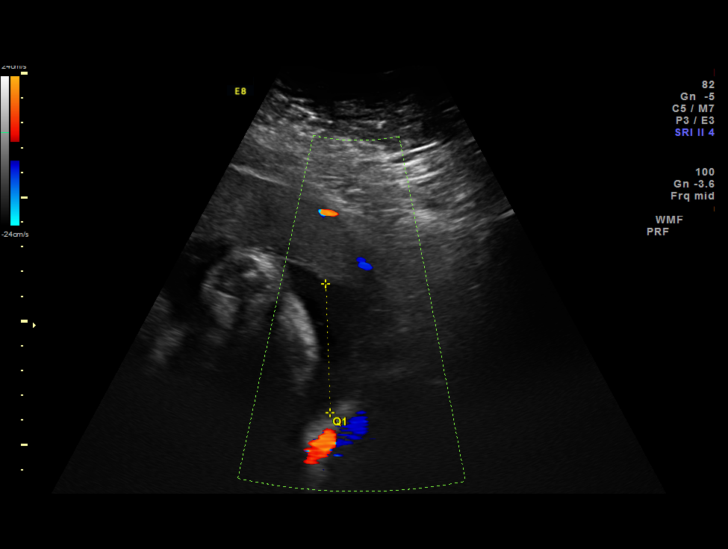
[im 4/35]
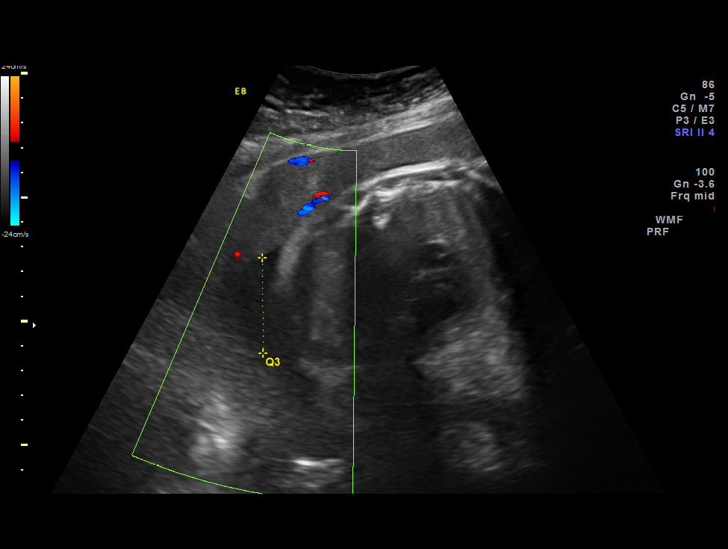
[im 7/35]
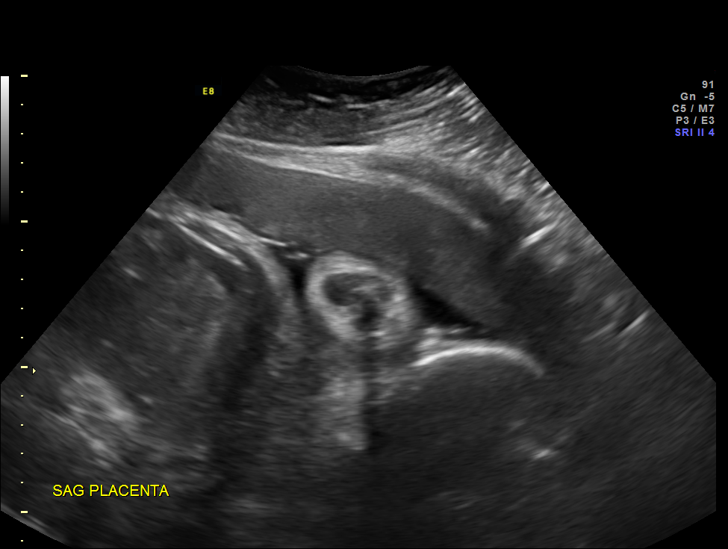
[im 11/35]
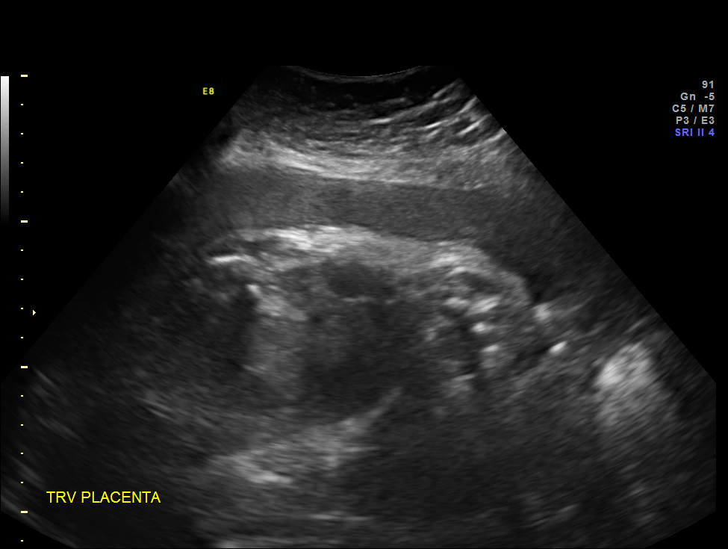
[im 13/35]
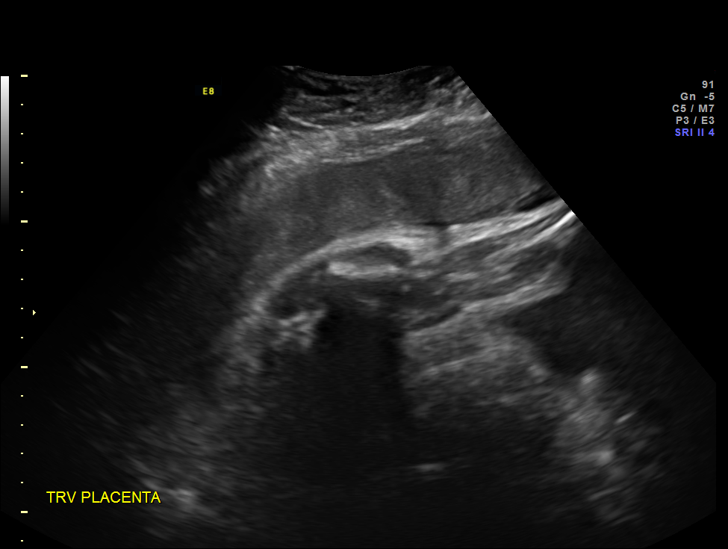
[im 16/35]
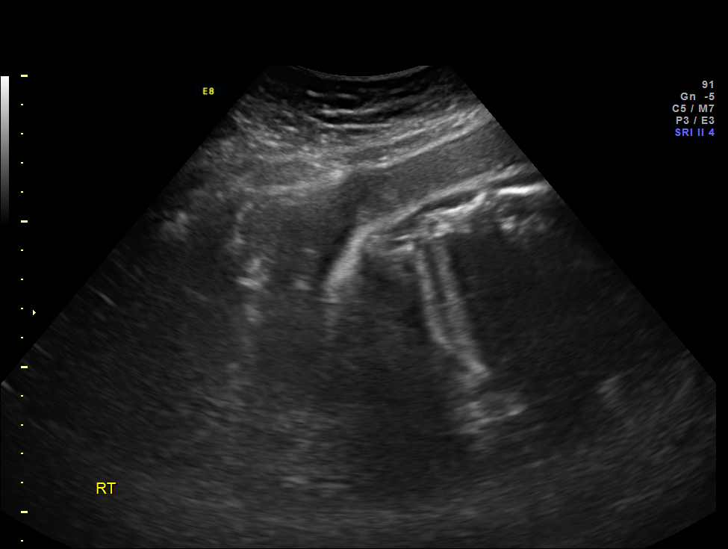
[im 19/35]
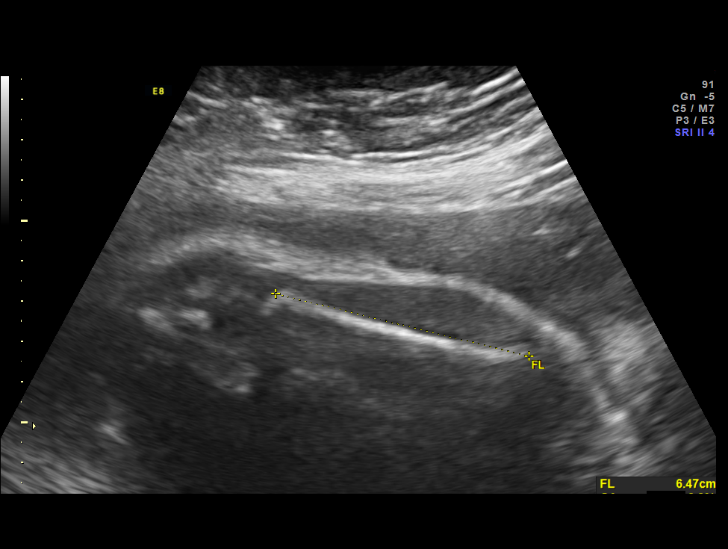
[im 22/35]
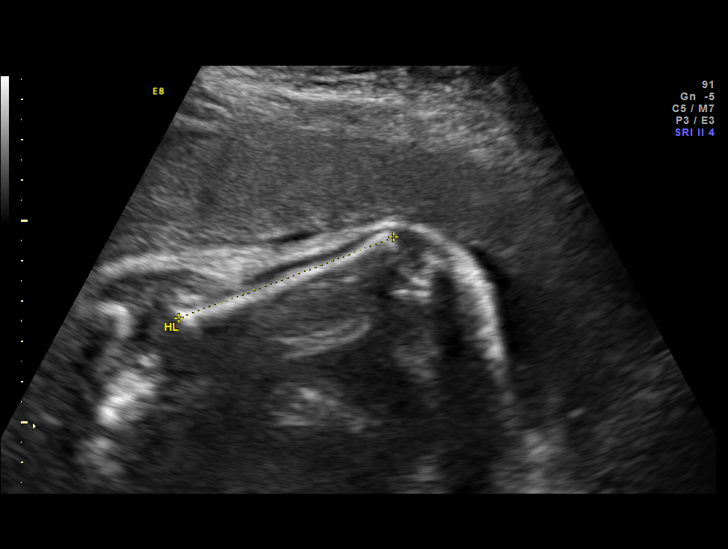
[im 24/35]
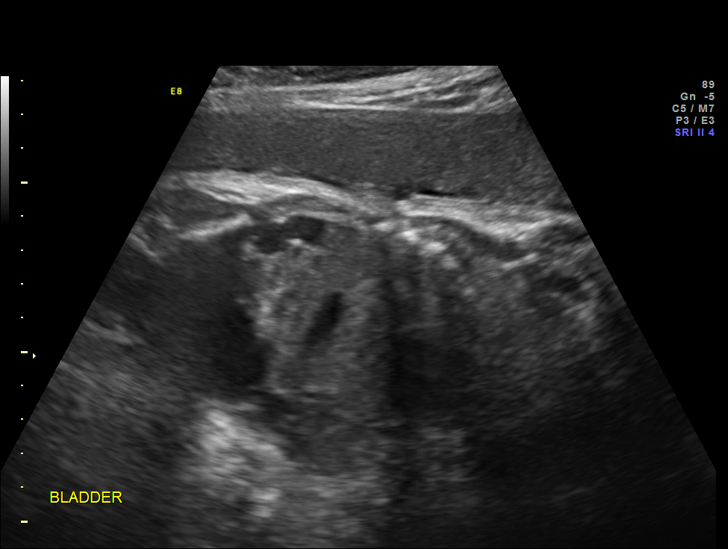
[im 28/35]
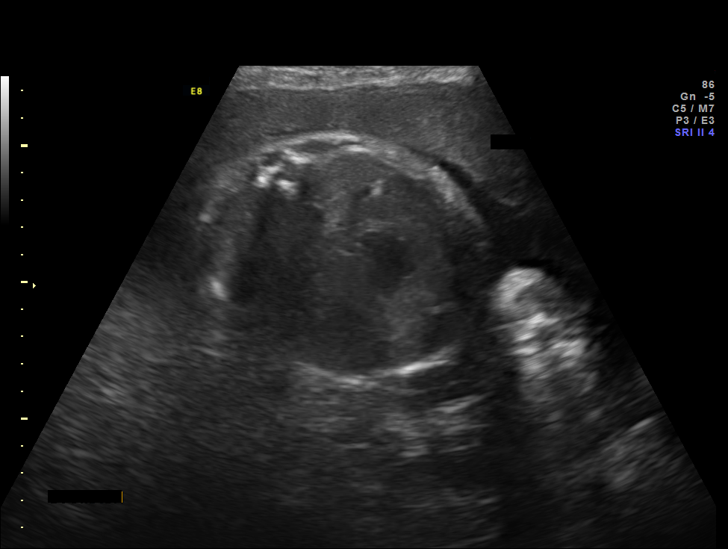
[im 31/35]
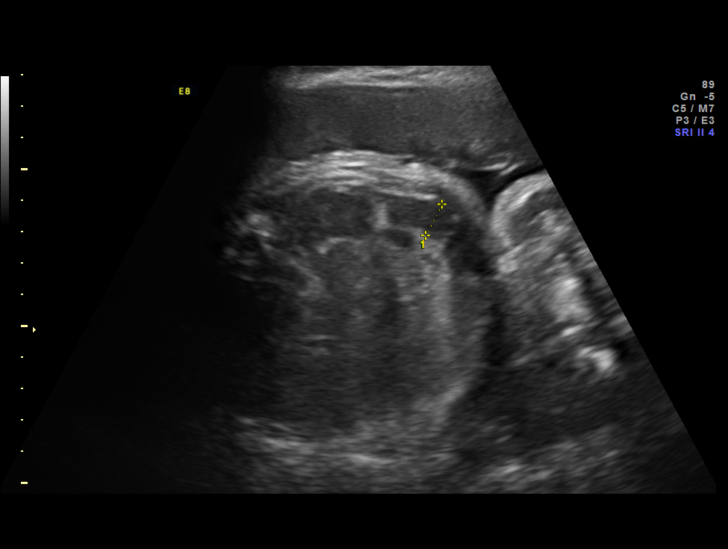
[im 33/35]
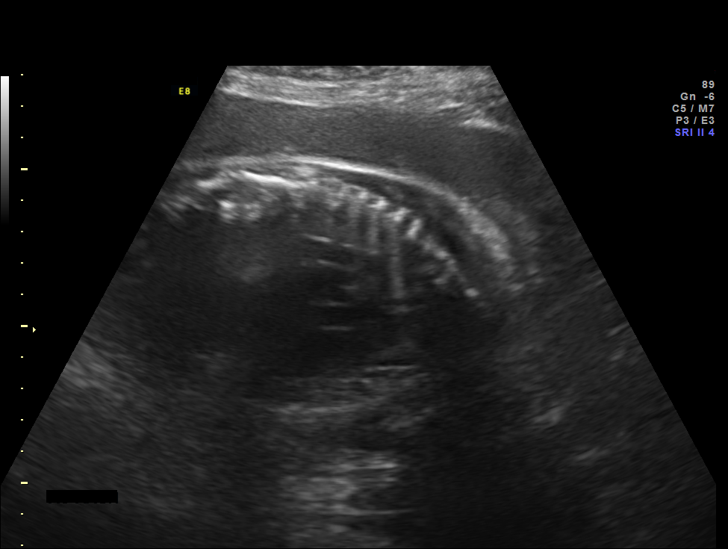

[12 of 28 positions shown; findings below may reference images not displayed]

OBSTETRICS REPORT
                      (Signed Final [DATE] [DATE])

Service(s) Provided

 US OB FOLLOW UP                                       76816.1
Indications

 Hypertension - Chronic/Pre-existing (on labetalol)    [FJ]
 Obesity complicating pregnancy                        [FJ] [FJ]
 Echogenic intracardiac focus of the heart  (EIF) -    [FJ]
 Negative QUAD screen
 35 weeks gestation of pregnancy
Fetal Evaluation

 Num Of Fetuses:    1
 Fetal Heart Rate:  142                          bpm
 Cardiac Activity:  Observed
 Presentation:      Cephalic
 Placenta:          Anterior, above cervical os
 P. Cord            Visualized, central
 Insertion:

 Amniotic Fluid
 AFI FV:      Subjectively within normal limits
 AFI Sum:     13.37   cm       46  %Tile     Larg Pckt:    5.21  cm
 RUQ:   5.21    cm   RLQ:    4.31   cm    LUQ:   3.85    cm
Biometry

 BPD:     91.8  mm     G. Age:  37w 2d                CI:        75.23   70 - 86
                                                      FL/HC:      19.3   20.1 -

 HC:     335.7  mm     G. Age:  38w 3d       82  %    HC/AC:      1.10   0.93 -

 AC:     304.2  mm     G. Age:  34w 3d       22  %    FL/BPD:     70.5   71 - 87
 FL:      64.7  mm     G. Age:  33w 3d        4  %    FL/AC:      21.3   20 - 24
 HUM:     57.2  mm     G. Age:  33w 1d       19  %
 Est. FW:    [FJ]  gm      5 lb 9 oz     42  %
Gestational Age

 U/S Today:     35w 6d                                        EDD:   [DATE]
 Best:          35w 5d     Det. By:  Early Ultrasound         EDD:   [DATE]
                                     ([DATE])
Anatomy

 Cranium:          Appears normal         Aortic Arch:      Appears normal
 Fetal Cavum:      Previously seen        Ductal Arch:      Previously seen
 Ventricles:       Previously seen        Diaphragm:        Previously seen
 Choroid Plexus:   Previously seen        Stomach:          Appears normal, left
                                                            sided
 Cerebellum:       Previously seen        Abdominal Wall:   Previously seen
 Posterior Fossa:  Previously seen        Cord Vessels:     Previously seen
 Nuchal Fold:      Not applicable (>20    Kidneys:          Appear normal
                   wks GA)
 Face:             Orbits and profile     Bladder:          Appears normal
                   previously seen
 Lips:             Previously seen        Spine:            Previously seen
 Heart:            Previously seen        Lower             Previously seen
                                          Extremities:
 RVOT:             Previously seen        Upper             Previously seen
                                          Extremities:
 LVOT:             Previously seen

 Other:  Female gender previously seen. Technically difficult due to maternal
         habitus and fetal position.
Cervix Uterus Adnexa

 Cervix:       Not visualized (advanced GA >[FJ])
 Uterus:       No abnormality visualized.
 Cul De Sac:   No free fluid seen.

 Left Ovary:    Not visualized.
 Right Ovary:   Not visualized.
 Adnexa:     No abnormality visualized.
Impression

 Single IUP at 35w 5d
 CHTN on Labetalol, Obesity
 Interval growth is appropriate (42nd %tile)
 An isolated dilated loop of bowel was again noted measuring
 11 mm (unchanged from prior exam)
 The remainder of the fetal anatomy was within normal limits
 Anterior placenta
 Normal amniotic fluid volume
Recommendations

 Follow-up ultrasounds as clinically indicated.
 Notifiy Peds at time of delivery regarding the dilated loop of
 bowel that was appreciated on ultrasound.

## 2014-11-01 ENCOUNTER — Encounter (HOSPITAL_COMMUNITY): Payer: Self-pay

## 2014-11-18 ENCOUNTER — Encounter (HOSPITAL_COMMUNITY): Payer: Self-pay | Admitting: *Deleted

## 2014-11-18 ENCOUNTER — Telehealth (HOSPITAL_COMMUNITY): Payer: Self-pay | Admitting: *Deleted

## 2014-11-18 NOTE — Telephone Encounter (Signed)
Preadmission screen  

## 2014-11-21 ENCOUNTER — Inpatient Hospital Stay (HOSPITAL_COMMUNITY)
Admission: RE | Admit: 2014-11-21 | Discharge: 2014-11-25 | DRG: 765 | Disposition: A | Discharge status: Home / Self Care | Payer: 59 | Source: Ambulatory Visit | Attending: Obstetrics and Gynecology | Admitting: Obstetrics and Gynecology

## 2014-11-21 ENCOUNTER — Encounter (HOSPITAL_COMMUNITY): Payer: Self-pay

## 2014-11-21 DIAGNOSIS — O10913 Unspecified pre-existing hypertension complicating pregnancy, third trimester: Secondary | ICD-10-CM | POA: Diagnosis present

## 2014-11-21 DIAGNOSIS — Z3490 Encounter for supervision of normal pregnancy, unspecified, unspecified trimester: Secondary | ICD-10-CM

## 2014-11-21 DIAGNOSIS — O1092 Unspecified pre-existing hypertension complicating childbirth: Secondary | ICD-10-CM | POA: Diagnosis present

## 2014-11-21 DIAGNOSIS — O99284 Endocrine, nutritional and metabolic diseases complicating childbirth: Secondary | ICD-10-CM | POA: Diagnosis present

## 2014-11-21 DIAGNOSIS — Z3A39 39 weeks gestation of pregnancy: Secondary | ICD-10-CM | POA: Diagnosis present

## 2014-11-21 DIAGNOSIS — E039 Hypothyroidism, unspecified: Secondary | ICD-10-CM | POA: Diagnosis present

## 2014-11-21 LAB — CBC
HCT: 33.1 % — ABNORMAL LOW (ref 36.0–46.0)
Hemoglobin: 11 g/dL — ABNORMAL LOW (ref 12.0–15.0)
MCH: 30.1 pg (ref 26.0–34.0)
MCHC: 33.2 g/dL (ref 30.0–36.0)
MCV: 90.4 fL (ref 78.0–100.0)
Platelets: 264 10*3/uL (ref 150–400)
RBC: 3.66 MIL/uL — AB (ref 3.87–5.11)
RDW: 14.4 % (ref 11.5–15.5)
WBC: 8.5 10*3/uL (ref 4.0–10.5)

## 2014-11-21 LAB — TYPE AND SCREEN
ABO/RH(D): O POS
Antibody Screen: NEGATIVE

## 2014-11-21 MED ORDER — ONDANSETRON HCL 4 MG/2ML IJ SOLN: 4.0000 mg | Freq: Four times a day (QID) | INTRAMUSCULAR | Status: DC | PRN

## 2014-11-21 MED ORDER — OXYCODONE-ACETAMINOPHEN 5-325 MG PO TABS: 1.0000 | ORAL_TABLET | ORAL | Status: DC | PRN

## 2014-11-21 MED ORDER — LIDOCAINE HCL (PF) 1 % IJ SOLN
30.0000 mL | INTRAMUSCULAR | Status: DC | PRN
  Filled 2014-11-21: qty 30

## 2014-11-21 MED ORDER — MISOPROSTOL 25 MCG QUARTER TABLET
25.0000 ug | ORAL_TABLET | ORAL | Status: AC | PRN
  Administered 2014-11-21: 25 ug via VAGINAL
  Filled 2014-11-21: qty 0.25

## 2014-11-21 MED ORDER — ACETAMINOPHEN 325 MG PO TABS: 650.0000 mg | ORAL_TABLET | ORAL | Status: DC | PRN

## 2014-11-21 MED ORDER — LACTATED RINGERS IV SOLN
INTRAVENOUS | Status: DC
  Administered 2014-11-21 – 2014-11-22 (×5): via INTRAVENOUS

## 2014-11-21 MED ORDER — CITRIC ACID-SODIUM CITRATE 334-500 MG/5ML PO SOLN
30.0000 mL | ORAL | Status: DC | PRN
  Administered 2014-11-22: 30 mL via ORAL
  Filled 2014-11-21: qty 15

## 2014-11-21 MED ORDER — OXYTOCIN 40 UNITS IN LACTATED RINGERS INFUSION - SIMPLE MED
1.0000 m[IU]/min | INTRAVENOUS | Status: DC
  Administered 2014-11-22: 1 m[IU]/min via INTRAVENOUS
  Filled 2014-11-21: qty 1000

## 2014-11-21 MED ORDER — ZOLPIDEM TARTRATE 5 MG PO TABS
5.0000 mg | ORAL_TABLET | Freq: Every evening | ORAL | Status: DC | PRN
  Administered 2014-11-21: 5 mg via ORAL
  Filled 2014-11-21 (×2): qty 1

## 2014-11-21 MED ORDER — OXYTOCIN BOLUS FROM INFUSION: 500.0000 mL | INTRAVENOUS | Status: DC

## 2014-11-21 MED ORDER — OXYCODONE-ACETAMINOPHEN 5-325 MG PO TABS: 2.0000 | ORAL_TABLET | ORAL | Status: DC | PRN

## 2014-11-21 MED ORDER — TERBUTALINE SULFATE 1 MG/ML IJ SOLN: 0.2500 mg | Freq: Once | INTRAMUSCULAR | Status: AC | PRN

## 2014-11-21 MED ORDER — BUTORPHANOL TARTRATE 1 MG/ML IJ SOLN
1.0000 mg | INTRAMUSCULAR | Status: DC | PRN
  Administered 2014-11-22: 1 mg via INTRAVENOUS
  Filled 2014-11-21: qty 1

## 2014-11-21 MED ORDER — OXYTOCIN 40 UNITS IN LACTATED RINGERS INFUSION - SIMPLE MED: 62.5000 mL/h | INTRAVENOUS | Status: DC

## 2014-11-21 MED ORDER — LABETALOL HCL 100 MG PO TABS
100.0000 mg | ORAL_TABLET | Freq: Once | ORAL | Status: AC
  Administered 2014-11-21: 100 mg via ORAL
  Filled 2014-11-21: qty 1

## 2014-11-21 MED ORDER — LACTATED RINGERS IV SOLN
500.0000 mL | INTRAVENOUS | Status: DC | PRN
  Administered 2014-11-22: 500 mL via INTRAVENOUS

## 2014-11-22 ENCOUNTER — Encounter (HOSPITAL_COMMUNITY): Payer: Self-pay

## 2014-11-22 ENCOUNTER — Encounter (HOSPITAL_COMMUNITY)
Admission: RE | Disposition: A | Discharge status: Home / Self Care | Payer: Self-pay | Source: Ambulatory Visit | Attending: Obstetrics and Gynecology

## 2014-11-22 ENCOUNTER — Inpatient Hospital Stay (HOSPITAL_COMMUNITY): Payer: 59 | Admitting: Anesthesiology

## 2014-11-22 LAB — ABO/RH: ABO/RH(D): O POS

## 2014-11-22 SURGERY — Surgical Case
Anesthesia: Epidural

## 2014-11-22 MED ORDER — FENTANYL 2.5 MCG/ML BUPIVACAINE 1/10 % EPIDURAL INFUSION (WH - ANES)
INTRAMUSCULAR | Status: DC | PRN
  Administered 2014-11-22: 11:00:00
  Administered 2014-11-22: 14 mL/h via EPIDURAL

## 2014-11-22 MED ORDER — NALBUPHINE HCL 10 MG/ML IJ SOLN: 5.0000 mg | Freq: Once | INTRAMUSCULAR | Status: AC | PRN

## 2014-11-22 MED ORDER — IBUPROFEN 600 MG PO TABS
600.0000 mg | ORAL_TABLET | Freq: Four times a day (QID) | ORAL | Status: DC
  Administered 2014-11-22 – 2014-11-25 (×10): 600 mg via ORAL
  Filled 2014-11-22 (×10): qty 1

## 2014-11-22 MED ORDER — WITCH HAZEL-GLYCERIN EX PADS: 1.0000 "application " | MEDICATED_PAD | CUTANEOUS | Status: DC | PRN

## 2014-11-22 MED ORDER — 0.9 % SODIUM CHLORIDE (POUR BTL) OPTIME
TOPICAL | Status: DC | PRN
  Administered 2014-11-22: 1000 mL

## 2014-11-22 MED ORDER — DIPHENHYDRAMINE HCL 50 MG/ML IJ SOLN: 12.5000 mg | INTRAMUSCULAR | Status: DC | PRN

## 2014-11-22 MED ORDER — LIDOCAINE-EPINEPHRINE (PF) 2 %-1:200000 IJ SOLN
INTRAMUSCULAR | Status: AC
  Filled 2014-11-22: qty 20

## 2014-11-22 MED ORDER — LACTATED RINGERS IV SOLN
INTRAVENOUS | Status: DC
  Administered 2014-11-23: 12:00:00 via INTRAVENOUS

## 2014-11-22 MED ORDER — NALBUPHINE HCL 10 MG/ML IJ SOLN: 5.0000 mg | INTRAMUSCULAR | Status: DC | PRN

## 2014-11-22 MED ORDER — EPHEDRINE 5 MG/ML INJ: 10.0000 mg | INTRAVENOUS | Status: DC | PRN

## 2014-11-22 MED ORDER — SCOPOLAMINE 1 MG/3DAYS TD PT72
1.0000 | MEDICATED_PATCH | Freq: Once | TRANSDERMAL | Status: DC
  Administered 2014-11-22: 1.5 mg via TRANSDERMAL

## 2014-11-22 MED ORDER — PHENYLEPHRINE 40 MCG/ML (10ML) SYRINGE FOR IV PUSH (FOR BLOOD PRESSURE SUPPORT): 80.0000 ug | PREFILLED_SYRINGE | INTRAVENOUS | Status: DC | PRN

## 2014-11-22 MED ORDER — DIBUCAINE 1 % RE OINT: 1.0000 "application " | TOPICAL_OINTMENT | RECTAL | Status: DC | PRN

## 2014-11-22 MED ORDER — FENTANYL 2.5 MCG/ML BUPIVACAINE 1/10 % EPIDURAL INFUSION (WH - ANES)
INTRAMUSCULAR | Status: AC
  Filled 2014-11-22: qty 125

## 2014-11-22 MED ORDER — ONDANSETRON HCL 4 MG/2ML IJ SOLN: 4.0000 mg | Freq: Three times a day (TID) | INTRAMUSCULAR | Status: DC | PRN

## 2014-11-22 MED ORDER — SIMETHICONE 80 MG PO CHEW
80.0000 mg | CHEWABLE_TABLET | Freq: Three times a day (TID) | ORAL | Status: DC
  Administered 2014-11-22 – 2014-11-24 (×7): 80 mg via ORAL
  Filled 2014-11-22 (×6): qty 1

## 2014-11-22 MED ORDER — LACTATED RINGERS IV SOLN
500.0000 mL | Freq: Once | INTRAVENOUS | Status: AC
  Administered 2014-11-22: 500 mL via INTRAVENOUS

## 2014-11-22 MED ORDER — OXYCODONE-ACETAMINOPHEN 5-325 MG PO TABS: 2.0000 | ORAL_TABLET | ORAL | Status: DC | PRN

## 2014-11-22 MED ORDER — MENTHOL 3 MG MT LOZG: 1.0000 | LOZENGE | OROMUCOSAL | Status: DC | PRN

## 2014-11-22 MED ORDER — ZOLPIDEM TARTRATE 5 MG PO TABS: 5.0000 mg | ORAL_TABLET | Freq: Every evening | ORAL | Status: DC | PRN

## 2014-11-22 MED ORDER — GENTAMICIN SULFATE 40 MG/ML IJ SOLN: INTRAVENOUS | Status: DC

## 2014-11-22 MED ORDER — SIMETHICONE 80 MG PO CHEW: 80.0000 mg | CHEWABLE_TABLET | ORAL | Status: DC | PRN

## 2014-11-22 MED ORDER — FENTANYL CITRATE 0.05 MG/ML IJ SOLN
INTRAMUSCULAR | Status: DC | PRN
  Administered 2014-11-22 (×2): 50 ug via INTRAVENOUS

## 2014-11-22 MED ORDER — ACETAMINOPHEN 160 MG/5ML PO SOLN: 325.0000 mg | ORAL | Status: DC | PRN

## 2014-11-22 MED ORDER — CYCLOBENZAPRINE HCL 10 MG PO TABS
10.0000 mg | ORAL_TABLET | Freq: Three times a day (TID) | ORAL | Status: DC | PRN
  Filled 2014-11-22: qty 1

## 2014-11-22 MED ORDER — IBUPROFEN 600 MG PO TABS: 600.0000 mg | ORAL_TABLET | Freq: Four times a day (QID) | ORAL | Status: DC | PRN

## 2014-11-22 MED ORDER — MORPHINE SULFATE 0.5 MG/ML IJ SOLN
INTRAMUSCULAR | Status: AC
  Filled 2014-11-22: qty 10

## 2014-11-22 MED ORDER — MIDAZOLAM HCL 2 MG/2ML IJ SOLN: 0.5000 mg | Freq: Once | INTRAMUSCULAR | Status: DC | PRN

## 2014-11-22 MED ORDER — NALOXONE HCL 1 MG/ML IJ SOLN: 1.0000 ug/kg/h | INTRAVENOUS | Status: DC | PRN

## 2014-11-22 MED ORDER — ACETAMINOPHEN 325 MG PO TABS: 325.0000 mg | ORAL_TABLET | ORAL | Status: DC | PRN

## 2014-11-22 MED ORDER — MEPERIDINE HCL 25 MG/ML IJ SOLN: 6.2500 mg | INTRAMUSCULAR | Status: DC | PRN

## 2014-11-22 MED ORDER — KETOROLAC TROMETHAMINE 30 MG/ML IJ SOLN
INTRAMUSCULAR | Status: AC
Start: 2014-11-22 — End: 2014-11-23
  Filled 2014-11-22: qty 1

## 2014-11-22 MED ORDER — LACTATED RINGERS IV SOLN
INTRAVENOUS | Status: DC | PRN
  Administered 2014-11-22: 14:00:00 via INTRAVENOUS

## 2014-11-22 MED ORDER — PHENYLEPHRINE 40 MCG/ML (10ML) SYRINGE FOR IV PUSH (FOR BLOOD PRESSURE SUPPORT)
80.0000 ug | PREFILLED_SYRINGE | INTRAVENOUS | Status: DC | PRN
  Filled 2014-11-22 (×2): qty 20

## 2014-11-22 MED ORDER — MORPHINE SULFATE (PF) 0.5 MG/ML IJ SOLN
INTRAMUSCULAR | Status: DC | PRN
  Administered 2014-11-22: 1 mg via INTRAVENOUS
  Administered 2014-11-22: 3 mg via EPIDURAL
  Administered 2014-11-22: 1 mg via INTRAVENOUS

## 2014-11-22 MED ORDER — KETOROLAC TROMETHAMINE 30 MG/ML IJ SOLN: 30.0000 mg | Freq: Once | INTRAMUSCULAR | Status: DC | PRN

## 2014-11-22 MED ORDER — LANOLIN HYDROUS EX OINT: 1.0000 "application " | TOPICAL_OINTMENT | CUTANEOUS | Status: DC | PRN

## 2014-11-22 MED ORDER — ACETAMINOPHEN 500 MG PO TABS
1000.0000 mg | ORAL_TABLET | Freq: Four times a day (QID) | ORAL | Status: AC
  Administered 2014-11-22 – 2014-11-23 (×4): 1000 mg via ORAL
  Filled 2014-11-22 (×4): qty 2

## 2014-11-22 MED ORDER — NALOXONE HCL 0.4 MG/ML IJ SOLN: 0.4000 mg | INTRAMUSCULAR | Status: DC | PRN

## 2014-11-22 MED ORDER — TETANUS-DIPHTH-ACELL PERTUSSIS 5-2.5-18.5 LF-MCG/0.5 IM SUSP: 0.5000 mL | Freq: Once | INTRAMUSCULAR | Status: DC

## 2014-11-22 MED ORDER — LIDOCAINE HCL (PF) 1 % IJ SOLN
INTRAMUSCULAR | Status: DC | PRN
  Administered 2014-11-22: 4 mL
  Administered 2014-11-22: 5 mL
  Administered 2014-11-22: 4 mL
  Administered 2014-11-22: 5 mL

## 2014-11-22 MED ORDER — GENTAMICIN SULFATE 40 MG/ML IJ SOLN
INTRAVENOUS | Status: AC
  Administered 2014-11-22: 14:00:00 via INTRAVENOUS
  Administered 2014-11-22: 100 mL via INTRAVENOUS
  Filled 2014-11-22: qty 11.25

## 2014-11-22 MED ORDER — FENTANYL 2.5 MCG/ML BUPIVACAINE 1/10 % EPIDURAL INFUSION (WH - ANES)
14.0000 mL/h | INTRAMUSCULAR | Status: DC | PRN
  Administered 2014-11-22: 14 mL/h via EPIDURAL
  Filled 2014-11-22: qty 125

## 2014-11-22 MED ORDER — OXYCODONE-ACETAMINOPHEN 5-325 MG PO TABS: 1.0000 | ORAL_TABLET | ORAL | Status: DC | PRN

## 2014-11-22 MED ORDER — ONDANSETRON HCL 4 MG PO TABS: 4.0000 mg | ORAL_TABLET | ORAL | Status: DC | PRN

## 2014-11-22 MED ORDER — OXYTOCIN 40 UNITS IN LACTATED RINGERS INFUSION - SIMPLE MED: 62.5000 mL/h | INTRAVENOUS | Status: AC

## 2014-11-22 MED ORDER — ONDANSETRON HCL 4 MG/2ML IJ SOLN
INTRAMUSCULAR | Status: AC
  Filled 2014-11-22: qty 2

## 2014-11-22 MED ORDER — OXYTOCIN 40 UNITS IN LACTATED RINGERS INFUSION - SIMPLE MED: 1.0000 m[IU]/min | INTRAVENOUS | Status: DC

## 2014-11-22 MED ORDER — LIDOCAINE-EPINEPHRINE (PF) 2 %-1:200000 IJ SOLN
INTRAMUSCULAR | Status: DC | PRN
  Administered 2014-11-22 (×2): 5 mL via EPIDURAL

## 2014-11-22 MED ORDER — PRENATAL MULTIVITAMIN CH
1.0000 | ORAL_TABLET | Freq: Every day | ORAL | Status: DC
  Administered 2014-11-23 – 2014-11-24 (×2): 1 via ORAL
  Filled 2014-11-22 (×2): qty 1

## 2014-11-22 MED ORDER — SENNOSIDES-DOCUSATE SODIUM 8.6-50 MG PO TABS
2.0000 | ORAL_TABLET | ORAL | Status: DC
  Administered 2014-11-22 – 2014-11-25 (×3): 2 via ORAL
  Filled 2014-11-22 (×3): qty 2

## 2014-11-22 MED ORDER — SODIUM CHLORIDE 0.9 % IJ SOLN: 3.0000 mL | INTRAMUSCULAR | Status: DC | PRN

## 2014-11-22 MED ORDER — OXYTOCIN 10 UNIT/ML IJ SOLN
40.0000 [IU] | INTRAVENOUS | Status: DC | PRN
  Administered 2014-11-22: 40 [IU] via INTRAVENOUS

## 2014-11-22 MED ORDER — SCOPOLAMINE 1 MG/3DAYS TD PT72
MEDICATED_PATCH | TRANSDERMAL | Status: AC
Start: 2014-11-22 — End: 2014-11-23
  Filled 2014-11-22: qty 1

## 2014-11-22 MED ORDER — DIPHENHYDRAMINE HCL 25 MG PO CAPS
25.0000 mg | ORAL_CAPSULE | ORAL | Status: DC | PRN
Start: 2014-11-22 — End: 2014-11-25

## 2014-11-22 MED ORDER — FENTANYL CITRATE 0.05 MG/ML IJ SOLN: 25.0000 ug | INTRAMUSCULAR | Status: DC | PRN

## 2014-11-22 MED ORDER — KETOROLAC TROMETHAMINE 30 MG/ML IJ SOLN: 30.0000 mg | Freq: Four times a day (QID) | INTRAMUSCULAR | Status: DC | PRN

## 2014-11-22 MED ORDER — PROMETHAZINE HCL 25 MG/ML IJ SOLN: 6.2500 mg | INTRAMUSCULAR | Status: DC | PRN

## 2014-11-22 MED ORDER — DIPHENHYDRAMINE HCL 25 MG PO CAPS: 25.0000 mg | ORAL_CAPSULE | Freq: Four times a day (QID) | ORAL | Status: DC | PRN

## 2014-11-22 MED ORDER — ONDANSETRON HCL 4 MG/2ML IJ SOLN
INTRAMUSCULAR | Status: DC | PRN
  Administered 2014-11-22: 4 mg via INTRAVENOUS

## 2014-11-22 MED ORDER — FENTANYL CITRATE 0.05 MG/ML IJ SOLN
INTRAMUSCULAR | Status: AC
  Filled 2014-11-22: qty 2

## 2014-11-22 MED ORDER — SODIUM BICARBONATE 8.4 % IV SOLN
INTRAVENOUS | Status: AC
  Filled 2014-11-22: qty 50

## 2014-11-22 MED ORDER — SIMETHICONE 80 MG PO CHEW
80.0000 mg | CHEWABLE_TABLET | ORAL | Status: DC
  Administered 2014-11-22 – 2014-11-25 (×3): 80 mg via ORAL
  Filled 2014-11-22 (×3): qty 1

## 2014-11-22 MED ORDER — KETOROLAC TROMETHAMINE 30 MG/ML IJ SOLN
30.0000 mg | Freq: Four times a day (QID) | INTRAMUSCULAR | Status: DC | PRN
  Administered 2014-11-22: 30 mg via INTRAMUSCULAR

## 2014-11-22 MED ORDER — MEPERIDINE HCL 25 MG/ML IJ SOLN
6.2500 mg | INTRAMUSCULAR | Status: DC | PRN
Start: 2014-11-22 — End: 2014-11-22

## 2014-11-22 MED ORDER — OXYTOCIN 10 UNIT/ML IJ SOLN
INTRAMUSCULAR | Status: AC
  Filled 2014-11-22: qty 4

## 2014-11-22 MED ORDER — PHENYLEPHRINE 40 MCG/ML (10ML) SYRINGE FOR IV PUSH (FOR BLOOD PRESSURE SUPPORT)
PREFILLED_SYRINGE | INTRAVENOUS | Status: AC
  Filled 2014-11-22: qty 20

## 2014-11-22 MED ORDER — ONDANSETRON HCL 4 MG/2ML IJ SOLN: 4.0000 mg | INTRAMUSCULAR | Status: DC | PRN

## 2014-11-22 MED ORDER — LABETALOL HCL 100 MG PO TABS
100.0000 mg | ORAL_TABLET | Freq: Two times a day (BID) | ORAL | Status: DC
  Administered 2014-11-23 – 2014-11-25 (×5): 100 mg via ORAL
  Filled 2014-11-22 (×6): qty 1

## 2014-11-22 SURGICAL SUPPLY — 30 items
CLAMP CORD UMBIL (MISCELLANEOUS) IMPLANT
CLOTH BEACON ORANGE TIMEOUT ST (SAFETY) ×3 IMPLANT
DRAPE SHEET LG 3/4 BI-LAMINATE (DRAPES) IMPLANT
DRSG OPSITE POSTOP 4X10 (GAUZE/BANDAGES/DRESSINGS) ×3 IMPLANT
DURAPREP 26ML APPLICATOR (WOUND CARE) ×3 IMPLANT
ELECT REM PT RETURN 9FT ADLT (ELECTROSURGICAL) ×3
ELECTRODE REM PT RTRN 9FT ADLT (ELECTROSURGICAL) ×1 IMPLANT
EXTRACTOR VACUUM M CUP 4 TUBE (SUCTIONS) IMPLANT
EXTRACTOR VACUUM M CUP 4' TUBE (SUCTIONS)
GLOVE SURG ORTHO 8.0 STRL STRW (GLOVE) ×3 IMPLANT
GOWN STRL REUS W/TWL LRG LVL3 (GOWN DISPOSABLE) ×6 IMPLANT
KIT ABG SYR 3ML LUER SLIP (SYRINGE) ×3 IMPLANT
NDL HYPO 25X5/8 SAFETYGLIDE (NEEDLE) ×1 IMPLANT
NEEDLE HYPO 25X5/8 SAFETYGLIDE (NEEDLE) ×3 IMPLANT
NS IRRIG 1000ML POUR BTL (IV SOLUTION) ×3 IMPLANT
PACK C SECTION WH (CUSTOM PROCEDURE TRAY) ×3 IMPLANT
PAD OB MATERNITY 4.3X12.25 (PERSONAL CARE ITEMS) ×3 IMPLANT
RETRACTOR WND ALEXIS 25 LRG (MISCELLANEOUS) IMPLANT
RTRCTR WOUND ALEXIS 25CM LRG (MISCELLANEOUS) ×3
SUT MNCRL 0 VIOLET CTX 36 (SUTURE) ×3 IMPLANT
SUT MON AB 4-0 PS1 27 (SUTURE) ×3 IMPLANT
SUT MONOCRYL 0 CTX 36 (SUTURE) ×8
SUT PDS AB 0 CTX 60 (SUTURE) ×2 IMPLANT
SUT PDS AB 1 CT  36 (SUTURE) ×2
SUT PDS AB 1 CT 36 (SUTURE) IMPLANT
SUT PLAIN 2 0 XLH (SUTURE) ×2 IMPLANT
SUT VIC AB 1 CTX 36 (SUTURE)
SUT VIC AB 1 CTX36XBRD ANBCTRL (SUTURE) IMPLANT
TOWEL OR 17X24 6PK STRL BLUE (TOWEL DISPOSABLE) ×3 IMPLANT
TRAY FOLEY CATH 14FR (SET/KITS/TRAYS/PACK) ×3 IMPLANT

## 2014-11-22 NOTE — Progress Notes (Signed)
Mother's mother-baby bracelet too tight. Mother request new bracelets. Old number [phone] removed from mother, father and baby removed and destroyed. New number [phone] replaced on mother ,father and baby.

## 2014-11-22 NOTE — Plan of Care (Signed)
Problem: Phase I Progression Outcomes Goal: Assess per MD/Nurse,Routine-VS,FHR,UC,Head to Toe assess Outcome: Progressing Pt with chronic hypertension, monitoring BP's every 30 minutes, currently taking labetalol 100 mg po twice daily, denies headache, visual disturbances, epigastric pain. Instructed to notify nurse if experiences any of before mentioned symptoms.

## 2014-11-22 NOTE — Transfer of Care (Signed)
Immediate Anesthesia Transfer of Care Note  Patient: Taylor [name]  Procedure(s) Performed: Procedure(s): CESAREAN SECTION (N/A)  Patient Location: PACU  Anesthesia Type:Epidural  Level of Consciousness: awake, alert  and oriented  Airway & Oxygen Therapy: Patient Spontanous Breathing  Post-op Assessment: Report given to RN and Post -op Vital signs reviewed and stable  Post vital signs: Reviewed and stable  Last Vitals:  Filed Vitals:   11/22/14 1331  BP: 125/78  Pulse: 101  Temp:   Resp:     Complications: No apparent anesthesia complications

## 2014-11-22 NOTE — Lactation Note (Signed)
This note was copied from the chart of Taylor [name]. Lactation Consultation Note  Initial visit at 8 hours of age.  Mom reports her nipples are flat due to iv fluids and are normally more erect.  Mom reports nipples have been crusted with colostrum.  After several minutes of hand expression small drop noted on left breast.  Assisted with football hold.  Baby is not able to maintain latch and slips on tip of nipple.  Hand pump used to help evert nipple with little change noted. #16 NS does not pull breast tissue into NS well, #20 pulls more breast tissue.  Baby latches well with intermittent sucks.  After several minutes baby sounded like he was swallowing well, but when removed no colostrum was noted in nipple shield.  Encouraged mom to continue to attempt latch without shield and use as needed after hand pumping.  [facility] and discussed.  Encouraged to feed with early cues on demand.  Early newborn behavior discussed. Worked on positioning due to moms large pendulous soft breast and angling baby with nose not covered by breast.    Mom to call for assist as needed.     Patient Name: Taylor [name] ZOXWR'UToday's Date: 11/22/2014 Reason for consult: Initial assessment;Difficult latch   Maternal Data Has patient been taught Hand Expression?: Yes Does the patient have breastfeeding experience prior to this delivery?: No  Feeding Feeding Type: Breast Fed Length of feed:  (on and off for 15 minutes)  LATCH Score/Interventions Latch: Repeated attempts needed to sustain latch, nipple held in mouth throughout feeding, stimulation needed to elicit sucking reflex. Intervention(s): Skin to skin;Teach feeding cues;Waking techniques  Audible Swallowing: A few with stimulation Intervention(s): Skin to skin;Hand expression  Type of Nipple: Flat Intervention(s): Hand pump  Comfort (Breast/Nipple): Soft / non-tender     Hold (Positioning): Assistance needed to correctly position  infant at breast and maintain latch. Intervention(s): Skin to skin;Position options;Support Pillows;Breastfeeding basics reviewed  LATCH Score: 6  Lactation Tools Discussed/Used Tools: Nipple Shields Nipple shield size: 16;20 Initiated by:: JS Date initiated:: 11/23/14   Consult Status Consult Status: Follow-up Date: 11/23/14 Follow-up type: In-patient    Shoptaw, Arvella MerlesJana Lynn 11/22/2014, 10:17 PM

## 2014-11-22 NOTE — Progress Notes (Signed)
Attempted to place Monica/Beacon monitor on patient due to intermittent difficulty tracing FHR and UC. Baby not on monitor while prepping abdomen.  Monitor would not confirm the sites so would not trace. Returned to traditional monitors.

## 2014-11-22 NOTE — Anesthesia Postprocedure Evaluation (Signed)
  Anesthesia Post Note  Patient: Taylor AcreRebecca W Overman  Procedure(s) Performed: Procedure(s) (LRB): CESAREAN SECTION (N/A)  Anesthesia type: Epidural  Patient location: PACU  Post pain: Pain level controlled  Post assessment: Post-op Vital signs reviewed  Last Vitals:  Filed Vitals:   11/22/14 1454  BP:   Pulse:   Temp: 36.9 C  Resp:     Post vital signs: Reviewed  Level of consciousness: awake  Complications: No apparent anesthesia complications

## 2014-11-22 NOTE — Progress Notes (Signed)
Patient ID: Taylor [name], female   DOB: 10/24/1985, 29 y.o.   MRN: [id number] Pt pushing for almost 2 hours with no descent of fetal vertex.  It appears she is pushing with good explusive effore. VSSAF FHR 130-140s + accels Ctx q 2-3 Adequate mvu by IUPC  Cx c/c/0 ROT No descent with pushing  Arrest of descent - discussed the lack of progress with the patient and her family at length She wishes to proceed with primary C/S.  R&B discussed ann informed consent obtained. DL

## 2014-11-22 NOTE — Anesthesia Procedure Notes (Addendum)
Epidural Patient location during procedure: OB Start time: 11/22/2014 5:00 AM  Staffing Anesthesiologist: Karie SchwalbeJUDD, MARY JENNETTE Performed by: anesthesiologist   Preanesthetic Checklist Completed: patient identified, site marked, surgical consent, pre-op evaluation, timeout performed, IV checked, risks and benefits discussed and monitors and equipment checked  Epidural Patient position: sitting Prep: site prepped and draped and DuraPrep Patient monitoring: continuous pulse ox and blood pressure Approach: midline Location: L3-L4 Injection technique: LOR saline  Needle:  Needle type: Tuohy  Needle gauge: 17 G Needle length: 9 cm and 9 Needle insertion depth: 9 cm Catheter type: closed end flexible Catheter size: 19 Gauge Catheter at skin depth: 15 cm Test dose: negative  Assessment Events: blood not aspirated, injection not painful, no injection resistance, negative IV test and no paresthesia  Additional Notes Patient identified. Risks/Benefits/Options discussed with patient including but not limited to bleeding, infection, nerve damage, paralysis, failed block, incomplete pain control, headache, blood pressure changes, nausea, vomiting, reactions to medication both or allergic, itching and postpartum back pain. Confirmed with bedside nurse the patient's most recent platelet count. Confirmed with patient that they are not currently taking any anticoagulation, have any bleeding history or any family history of bleeding disorders. Patient expressed understanding and wished to proceed. All questions were answered. Sterile technique was used throughout the entire procedure. Please see nursing notes for vital signs. Test dose was given through epidural catheter and negative prior to continuing to dose epidural or start infusion. Warning signs of high block given to the patient including shortness of breath, tingling/numbness in hands, complete motor block, or any concerning symptoms with  instructions to call for help. Patient was given instructions on fall risk and not to get out of bed. All questions and concerns addressed with instructions to call with any issues or inadequate analgesia.     Epidural Patient location during procedure: OB Start time: 11/22/2014 8:30 AM  Staffing Anesthesiologist: Brayton CavesJACKSON, Wilkins Elpers Performed by: anesthesiologist   Preanesthetic Checklist Completed: patient identified, site marked, surgical consent, pre-op evaluation, timeout performed, IV checked, risks and benefits discussed and monitors and equipment checked  Epidural Patient position: sitting Prep: site prepped and draped and DuraPrep Patient monitoring: continuous pulse ox and blood pressure Approach: midline Location: L2-L3 Injection technique: LOR air  Needle:  Needle type: Tuohy  Needle gauge: 17 G Needle length: 9 cm and 9 Needle insertion depth: 9 cm Catheter type: closed end flexible Catheter size: 19 Gauge Catheter at skin depth: 15 cm Test dose: negative  Assessment Events: blood not aspirated, injection not painful, no injection resistance, negative IV test and no paresthesia  Additional Notes Patient identified.  Risk benefits discussed including failed block, incomplete pain control, headache, nerve damage, paralysis, blood pressure changes, nausea, vomiting, reactions to medication both toxic or allergic, and postpartum back pain.  Patient expressed understanding and wished to proceed.  All questions were answered.  Sterile technique used throughout procedure and epidural site dressed with sterile barrier dressing. No paresthesia or other complications noted.The patient did not experience any signs of intravascular injection such as tinnitus or metallic taste in mouth nor signs of intrathecal spread such as rapid motor block. Please see nursing notes for vital signs.

## 2014-11-22 NOTE — Op Note (Signed)
Cesarean Section Procedure Note  Pre-operative Diagnosis: IUP at 39 weeks, Chronic HTN, Arrest of Descent  Post-operative Diagnosis: same + OP presentation  Surgeon: Brilyn Tuller C   Assistants: None  Anesthesia:epidural  Procedure:  Low Segment Transverse cesarean section  Procedure Details  The patient was seen in the Holding Room. The risks, benefits, complications, treatment options, and expected outcomes were discussed with the patient.  The patient concurred with the proposed plan, giving informed consent.  The site of surgery properly noted/marked.. A Time Out was held and the above information confirmed.  After induction of anesthesia, the patient was draped and prepped in the usual sterile manner. A Pfannenstiel incision was made and carried down through the subcutaneous tissue to the fascia. Fascial incision was made and extended transversely. The fascia was separated from the underlying rectus tissue superiorly and inferiorly. The peritoneum was identified and entered. Peritoneal incision was extended longitudinally. The utero-vesical peritoneal reflection was incised transversely and the bladder flap was bluntly freed from the lower uterine segment. A low transverse uterine incision was made. Delivered from vertex ROP presentation was a baby with Apgar scores of 7 at one minute and 8 at five minutes. After the umbilical cord was clamped and cut cord blood was obtained for evaluation. The placenta was removed intact and appeared normal. The uterine outline, tubes and ovaries appeared normal. The uterine incision was closed with running locked sutures of 0 monocryl and imbricated with 0 monocryl. Hemostasis was observed. Lavage was carried out until clear. The peritoneum was then closed with 0 monocryl and rectus muscles plicated in the midline.  After hemostasis was assured, the fascia was then reapproximated with running sutures of 0 Double stranded PDS. Irrigation was applied and after  adequate hemostasis was assured, the skin was reapproximated with 0 plain suture and 4-0 monocryl sq suture..  Instrument, sponge, and needle counts were correct prior the abdominal closure and at the conclusion of the case. The patient received clindamycin and gentamicin preoperatively.  Findings: Viable female, ROP presentation  Estimated Blood Loss:  400cc         Specimens: Placenta was sent to L&D         Complications:  None

## 2014-11-22 NOTE — H&P (Signed)
Taylor [name] is a 29 y.o. female [facility]  Normal monitoring and US throughout preg.  GBS-. History OB History    Gravida Para Term Preterm AB TAB SAB Ectopic Multiple Living   1              Past Medical History  Diagnosis Date  . Hypertension   . H/O back injury     broken back age 29  . Hypothyroidism    Past Surgical History  Procedure Laterality Date  . Wisdom tooth extraction     Family History: family history includes Cancer in her maternal grandmother and paternal grandmother; Diabetes in her maternal grandfather; Heart disease in her maternal grandmother; Hypertension in her father, maternal grandfather, maternal grandmother, mother, paternal grandfather, and paternal grandmother; Stroke in her maternal grandmother. Social History:  reports that she has quit smoking. She does not have any smokeless tobacco history on file. She reports that she does not drink alcohol or use illicit drugs.   Prenatal Transfer Tool  Maternal Diabetes: No Genetic Screening: Normal Maternal Ultrasounds/Referrals: Normal Fetal Ultrasounds or other Referrals:  None Maternal Substance Abuse:  No Significant Maternal Medications:  None Significant Maternal Lab Results:  None Other Comments:  None  ROS  Dilation: 5 Effacement (%): 90 Station: -2 Exam by:: Dr. Rana SnareLowe Blood pressure 121/77, pulse 79, temperature 98 F (36.7 C), temperature source Oral, resp. rate 20, height 5' 6.5" (1.689 m), weight 297 lb (134.718 kg), SpO2 100 %. Exam Physical Exam  Prenatal labs: ABO, Rh: --/--/O POS, O POS (02/08 2030) Antibody: NEG (02/08 2030) Rubella: Immune (08/13 0000) RPR: Nonreactive (08/13 0000)  HBsAg: Negative (08/13 0000)  HIV: Non-reactive (08/13 0000)  GBS: Negative (01/13 0000)   Assessment/Plan: IUP at term now in active labor AROM/Pit - anticipate SVD CHTN - BPs stable on labetalol   [name] C 11/22/2014, 9:35  AM

## 2014-11-22 NOTE — Anesthesia Preprocedure Evaluation (Addendum)
Anesthesia Evaluation  Patient identified by MRN, date of birth, ID band Patient awake    Reviewed: Allergy & Precautions, NPO status , Patient's Chart, lab work & pertinent test results  Airway Mallampati: II  TM Distance: >3 FB Neck ROM: Full    Dental no notable dental hx. (+) Dental Advisory Given   Pulmonary former smoker,  breath sounds clear to auscultation  Pulmonary exam normal       Cardiovascular hypertension, Pt. on medications Rhythm:Regular Rate:Normal     Neuro/Psych negative neurological ROS  negative psych ROS   GI/Hepatic negative GI ROS, Neg liver ROS,   Endo/Other  Hypothyroidism Morbid obesity  Renal/GU negative Renal ROS  negative genitourinary   Musculoskeletal negative musculoskeletal ROS (+)   Abdominal   Peds negative pediatric ROS (+)  Hematology negative hematology ROS (+) Reports chronic low back pain   Anesthesia Other Findings   Reproductive/Obstetrics (+) Pregnancy                            Anesthesia Physical Anesthesia Plan  ASA: III  Anesthesia Plan: Epidural   Post-op Pain Management:    Induction:   Airway Management Planned:   Additional Equipment:   Intra-op Plan:   Post-operative Plan:   Informed Consent: I have reviewed the patients History and Physical, chart, labs and discussed the procedure including the risks, benefits and alternatives for the proposed anesthesia with the patient or authorized representative who has indicated his/her understanding and acceptance.   Dental advisory given  Plan Discussed with:   Anesthesia Plan Comments:         Anesthesia Quick Evaluation

## 2014-11-23 ENCOUNTER — Encounter (HOSPITAL_COMMUNITY): Payer: Self-pay | Admitting: Obstetrics and Gynecology

## 2014-11-23 LAB — CBC
HCT: 26.4 % — ABNORMAL LOW (ref 36.0–46.0)
Hemoglobin: 8.6 g/dL — ABNORMAL LOW (ref 12.0–15.0)
MCH: 29.9 pg (ref 26.0–34.0)
MCHC: 32.6 g/dL (ref 30.0–36.0)
MCV: 91.7 fL (ref 78.0–100.0)
PLATELETS: 226 10*3/uL (ref 150–400)
RBC: 2.88 MIL/uL — AB (ref 3.87–5.11)
RDW: 14.6 % (ref 11.5–15.5)
WBC: 10.3 10*3/uL (ref 4.0–10.5)

## 2014-11-23 LAB — RPR: RPR Ser Ql: NONREACTIVE

## 2014-11-23 LAB — BIRTH TISSUE RECOVERY COLLECTION (PLACENTA DONATION)

## 2014-11-23 NOTE — Anesthesia Postprocedure Evaluation (Signed)
  Anesthesia Post-op Note  Patient: Taylor [name]  Procedure(s) Performed: Procedure(s): CESAREAN SECTION (N/A)  Patient Location: Mother/Baby  Anesthesia Type:Epidural  Level of Consciousness: awake, alert , oriented and patient cooperative  Airway and Oxygen Therapy: Patient Spontanous Breathing  Post-op Pain: none  Post-op Assessment: Post-op Vital signs reviewed, Patient's Cardiovascular Status Stable, Respiratory Function Stable, Patent Airway, No signs of Nausea or vomiting, Adequate PO intake, Pain level controlled, No headache, No backache, No residual numbness and No residual motor weakness  Post-op Vital Signs: Reviewed and stable  Last Vitals:  Filed Vitals:   11/23/14 0635  BP: 121/55  Pulse: 64  Temp: 36.7 C  Resp: 18    Complications: No apparent anesthesia complications

## 2014-11-23 NOTE — Lactation Note (Signed)
This note was copied from the chart of Taylor [name]. Lactation Consultation Note  Mother prepumped w/ hand pump to evert nipple and applied #20NS.   Attempted latching with #20NS and baby only sucked a few times and fell asleep. Baby has difficulty staying deep enough on NS.  Demonstrated to parents how to bring more chin in than nose. Sleepy baby.  Prefilled #20NS with 5 ml hydrolzyed formula.  With lots of stimulation baby took the 5ml in NS. Plan is for mother to prepump, apply NS and fill with either pumped breastmik or formula. Parents have volume guidelines and know to increase after 24 hours. If baby is too sleepy at the breast, parents will give formula or pumped breastmilk in bottle with slow flow nipple. Reviewed milk storage guidelines.  Mother plans to pump 3 more times today and 4-6 times for 15 - 20 min tomorrow. Encouraged mother to massage breast during feeding and be sure baby is deep enough on NS.  Patient Name: Taylor [name] NWGNF'AToday's Date: 11/23/2014 Reason for consult: Follow-up assessment   Maternal Data    Feeding Feeding Type: Breast Fed Length of feed: 20 min (off and on)  LATCH Score/Interventions Latch: Repeated attempts needed to sustain latch, nipple held in mouth throughout feeding, stimulation needed to elicit sucking reflex. Intervention(s): Waking techniques;Skin to skin Intervention(s): Breast massage;Assist with latch;Adjust position  Audible Swallowing: A few with stimulation  Type of Nipple: Flat  Comfort (Breast/Nipple): Soft / non-tender     Hold (Positioning): No assistance needed to correctly position infant at breast.  LATCH Score: 7  Lactation Tools Discussed/Used Tools: Nipple Shields Nipple shield size: 20   Consult Status Consult Status: Follow-up Date: 11/24/14 Follow-up type: In-patient    Dahlia ByesBerkelhammer, Sande Pickert [facility] 11/23/2014, 2:13 PM

## 2014-11-23 NOTE — Progress Notes (Signed)
Subjective: Postpartum Day 1: Cesarean Delivery Patient reports tolerating PO.    Objective: Vital signs in last 24 hours: Temp:  [97 F (36.1 C)-99.5 F (37.5 C)] 98.1 F (36.7 C) (02/10 0635) Pulse Rate:  [64-101] 64 (02/10 0635) Resp:  [14-20] 18 (02/10 0635) BP: (107-139)/(55-109) 121/55 mmHg (02/10 0635) SpO2:  [97 %-100 %] 98 % (02/10 16100635)  Physical Exam:  General: alert and appears stated age Lochia: appropriate Uterine Fundus: firm Incision: healing well DVT Evaluation: No evidence of DVT seen on physical exam. Negative Homan's sign. No cords or calf tenderness. No significant calf/ankle edema.   Recent Labs  11/21/14 2030 11/23/14 0700  HGB 11.0* 8.6*  HCT 33.1* 26.4*    Assessment/Plan: Status post Cesarean section. Doing well postoperatively.  Continue current care.  Kalene Cutler G 11/23/2014, 8:45 AM

## 2014-11-23 NOTE — Lactation Note (Signed)
This note was copied from the chart of Taylor [name]. Lactation Consultation Note  Baby jaundiced and sleepy.  Mother's nipples flat, right more than left. Reviewed hand expression on both breasts.  No colostrum expressed. Had mother prepump w/ manual pump and apply #20NS. Attempted latching baby on right breast but baby only mouthed nipple.  Baby is also spitty. Parents suctioned mouth w/ bulb. Relatched on left nipple w/ NS in football hold.  Some sucks observed with stimulation but baby very sleepy. Would like to view next feeding.  Discussed w/ parents the possible need to supplement w/ hydrolyzed formula. Mother has pumped and received small amount of colostrum.    Encouraged mother to keep pumping every 3 hours to stimulate her milk supply. Will follow up w/ next feeding.  Patient Name: Taylor [name] ZOXWR'UToday's Date: 11/23/2014 Reason for consult: Follow-up assessment   Maternal Data    Feeding Feeding Type: Breast Fed  LATCH Score/Interventions Latch: Repeated attempts needed to sustain latch, nipple held in mouth throughout feeding, stimulation needed to elicit sucking reflex. Intervention(s): Waking techniques;Skin to skin Intervention(s): Breast massage;Assist with latch;Adjust position  Audible Swallowing: None Intervention(s): Hand expression  Type of Nipple: Flat Intervention(s): Double electric pump;Hand pump  Comfort (Breast/Nipple): Soft / non-tender     Hold (Positioning): No assistance needed to correctly position infant at breast.  LATCH Score: 6  Lactation Tools Discussed/Used Tools: Nipple Shields Nipple shield size: 20   Consult Status Consult Status: Follow-up Date: 11/24/14 Follow-up type: In-patient    Dahlia ByesBerkelhammer, Montoya Brandel [facility] 11/23/2014, 9:48 AM

## 2014-11-24 MED ORDER — HYDROCODONE-ACETAMINOPHEN 10-325 MG PO TABS: 1.0000 | ORAL_TABLET | ORAL | Status: DC | PRN

## 2014-11-24 MED ORDER — HYDROCODONE-ACETAMINOPHEN 5-325 MG PO TABS
1.0000 | ORAL_TABLET | ORAL | Status: DC | PRN
  Administered 2014-11-24 – 2014-11-25 (×3): 1 via ORAL
  Administered 2014-11-25: 2 via ORAL
  Filled 2014-11-24 (×2): qty 1
  Filled 2014-11-24: qty 2
  Filled 2014-11-24: qty 1

## 2014-11-24 MED ORDER — ACETAMINOPHEN 500 MG PO TABS: 500.0000 mg | ORAL_TABLET | Freq: Four times a day (QID) | ORAL | Status: AC | PRN

## 2014-11-24 NOTE — Progress Notes (Signed)
Patient states that percocet "knocks her out" and she does not want to take it; however, she would like something a little stronger than ibuprofen to control her pain. Patient states she can take Vicodin with no problems. Notified Dr. Vincente PoliGrewal when she made rounds on the patient. Dr. Vincente PoliGrewal ordered Vicodin Q 4-6 hours PRN. Will continue to monitor. Earl Galasborne, Linda HedgesStefanie Palma SolaHudspeth

## 2014-11-24 NOTE — Lactation Note (Signed)
This note was copied from the chart of Taylor [name]. Lactation Consultation Note  Patient Name: Taylor [name] XBJYN'WToday's Date: 11/24/2014 Reason for consult: Follow-up assessment;Hyperbilirubinemia (see LC note )  Baby is 6649 hours old and is on Double photo tx . Per mom has been pumping every 3 hours and today  Started getting EBM yield. <5 ml  For the 1st time this afternoon . LC recommended to continue being consistent  With pumping to bring her mature milk in. Per mom presently feeding baby with a bottle with formula just to get the calories in  To bring the bilirubin down. LC suggested if hte baby is wide awake and can latch to allow her to feed for 15 mins , and supplement after wards. But for the sluggish feedings , feed from a bottle. Also if latching can use the Photo tx blankets at the same time.    Maternal Data    Feeding    LATCH Score/Interventions                Intervention(s): Breastfeeding basics reviewed     Lactation Tools Discussed/Used     Consult Status Consult Status: Follow-up Date: 11/25/14 Follow-up type: In-patient    Kathrin Greathouseorio, [name] Ann 11/24/2014, 3:40 PM

## 2014-11-24 NOTE — Progress Notes (Signed)
Subjective: Postpartum Day 2: Cesarean Delivery Patient reports incisional pain, tolerating PO, + flatus and no problems voiding.  Baby under double phototherapy  Objective: Vital signs in last 24 hours: Temp:  [98.1 F (36.7 C)-98.8 F (37.1 C)] 98.2 F (36.8 C) (02/11 0621) Pulse Rate:  [74-84] 77 (02/11 0621) Resp:  [16-18] 18 (02/11 0621) BP: (111-142)/(49-81) 135/79 mmHg (02/11 0621) SpO2:  [98 %] 98 % (02/10 1516)  Physical Exam:  General: alert and cooperative Lochia: appropriate Uterine Fundus: firm Incision: healing well DVT Evaluation: No evidence of DVT seen on physical exam. Negative Homan's sign. No cords or calf tenderness. Calf/Ankle edema is present.   Recent Labs  11/21/14 2030 11/23/14 0700  HGB 11.0* 8.6*  HCT 33.1* 26.4*    Assessment/Plan: Status post Cesarean section. Doing well postoperatively.  Continue current care.  Oluwatobi Visser G 11/24/2014, 8:27 AM

## 2014-11-25 MED ORDER — IBUPROFEN 600 MG PO TABS: 600.0000 mg | ORAL_TABLET | Freq: Four times a day (QID) | ORAL | Status: DC

## 2014-11-25 MED ORDER — HYDROCODONE-ACETAMINOPHEN 5-325 MG PO TABS: 1.0000 | ORAL_TABLET | ORAL | Status: DC | PRN

## 2014-11-25 NOTE — Discharge Summary (Signed)
Obstetric Discharge Summary Reason for Admission: induction of labor Prenatal Procedures: ultrasound Intrapartum Procedures: cesarean: low cervical, transverse Postpartum Procedures: none Complications-Operative and Postpartum: none HEMOGLOBIN  Date Value Ref Range Status  11/23/2014 8.6* 12.0 - 15.0 [name]/dL Final    Comment:    REPEATED TO VERIFY DELTA CHECK NOTED    HCT  Date Value Ref Range Status  11/23/2014 26.4* 36.0 - 46.0 % Final    Physical Exam:  General: alert and cooperative Lochia: appropriate Uterine Fundus: firm Incision: honeycomb dressing changed this am. No active bleeding or drainage noted DVT Evaluation: No evidence of DVT seen on physical exam. Negative Homan's sign. No cords or calf tenderness. Calf/Ankle edema is present.  Discharge Diagnoses: Term Pregnancy-delivered  Discharge Information: Date: 11/25/2014 Activity: pelvic rest Diet: routine Medications: PNV, Ibuprofen and Vicodin Condition: stable Instructions: refer to practice specific booklet Discharge to: home   Newborn Data: Live born female  Birth Weight: 6 lb 5.6 oz (2880 [name]) APGAR: 7, 8  Home with mother.  Taylor [name] 11/25/2014, 8:29 AM

## 2014-11-25 NOTE — Lactation Note (Signed)
This note was copied from the chart of Taylor [name]. Lactation Consultation Note  Patient Name: Taylor [name] AVWUJ'WToday's Date: 11/25/2014 Reason for consult: Follow-up assessment;Other (Comment) (milk is in ) Baby is 4068 hours old and is presently laying in her crib , wide awake and calm. Per mom milk is in and just pumped for 70 ml , milk at bedside . Per mom the baby  cluster fed all night and I didn't get a chance to latch because I needed to keep up with her cluster feeding. Mom knows to increase volume being fed to the baby and to change to medium based nipple (per mom has a DR. Brown at home ) . Baby is only 1 % weight loss 6-4.4 oz , S/P double photo therapy , voiding and stooling adequately, bili decreased to 7.8 .  Sore nipple and engorgement prevention and tx reviewed . LC mentioned to mom if she desires to re-latch , she already has the NS , and [facility] Mother informed of post-discharge support and given phone number to the lactation department, including services for phone call assistance; out-patient appointments; and breastfeeding support group. List of other breastfeeding resources in the community given in the handout. Encouraged mother to call for problems or concerns related to breastfeeding.  Maternal Data    Feeding    LATCH Score/Interventions                Intervention(s): Breastfeeding basics reviewed (see LC note )     Lactation Tools Discussed/Used WIC Program: No   Consult Status Consult Status: Complete Date: 11/25/14    Kathrin Greathouseorio, Sharanda Shinault Ann 11/25/2014, 10:50 AM

## 2014-12-31 ENCOUNTER — Emergency Department (HOSPITAL_COMMUNITY)
Admission: EM | Admit: 2014-12-31 | Discharge: 2014-12-31 | Disposition: A | Discharge status: Home / Self Care | Payer: 59 | Attending: Emergency Medicine | Admitting: Emergency Medicine

## 2014-12-31 ENCOUNTER — Encounter (HOSPITAL_COMMUNITY): Payer: Self-pay | Admitting: Nurse Practitioner

## 2014-12-31 DIAGNOSIS — R21 Rash and other nonspecific skin eruption: Secondary | ICD-10-CM | POA: Diagnosis present

## 2014-12-31 DIAGNOSIS — I10 Essential (primary) hypertension: Secondary | ICD-10-CM | POA: Diagnosis not present

## 2014-12-31 DIAGNOSIS — B372 Candidiasis of skin and nail: Secondary | ICD-10-CM | POA: Insufficient documentation

## 2014-12-31 DIAGNOSIS — Z79899 Other long term (current) drug therapy: Secondary | ICD-10-CM | POA: Insufficient documentation

## 2014-12-31 DIAGNOSIS — Z8639 Personal history of other endocrine, nutritional and metabolic disease: Secondary | ICD-10-CM | POA: Diagnosis not present

## 2014-12-31 DIAGNOSIS — Z87891 Personal history of nicotine dependence: Secondary | ICD-10-CM | POA: Insufficient documentation

## 2014-12-31 MED ORDER — FLUCONAZOLE 200 MG PO TABS: 200.0000 mg | ORAL_TABLET | Freq: Every day | ORAL | Status: DC

## 2014-12-31 NOTE — ED Provider Notes (Signed)
CSN: [id number]     Arrival date & time 12/31/14  0009 History   First MD Initiated Contact with Patient 12/31/14 0354     Chief Complaint  Patient presents with  . Rash    Perinium Rash     (Consider location/radiation/quality/duration/timing/severity/associated sxs/prior Treatment) Patient is a 29 y.o. female presenting with rash. The history is provided by the patient.  Rash She had a cesarean birth about 5 weeks ago. One week ago, she developed some irritation in the region of her scar and was seen by her current oncologist to treated her with topical clotrimazole. She was doing well until today when she started having burning in that area and noticed a rash that had not been present previously. She denies fever or chills. She is complaining of pain which he describes as burning and she rates at 9/10. She has not taken anything for pain. She is breast-feeding.  Past Medical History  Diagnosis Date  . Hypertension   . H/O back injury     broken back age 81  . Hypothyroidism    Past Surgical History  Procedure Laterality Date  . Wisdom tooth extraction    . Cesarean section N/A 11/22/2014    Procedure: CESAREAN SECTION;  Surgeon: Turner Daniels, MD;  Location: [facility];  Service: Obstetrics;  Laterality: N/A;   Family History  Problem Relation Age of Onset  . Hypertension Mother   . Hypertension Father   . Heart disease Maternal Grandmother   . Hypertension Maternal Grandmother   . Stroke Maternal Grandmother   . Cancer Maternal Grandmother     breast colon  . Hypertension Maternal Grandfather   . Diabetes Maternal Grandfather   . Hypertension Paternal Grandmother   . Cancer Paternal Grandmother     kidney  . Hypertension Paternal Grandfather    History  Substance Use Topics  . Smoking status: Former Games developer  . Smokeless tobacco: Not on file  . Alcohol Use: No   OB History    Gravida Para Term Preterm AB TAB SAB Ectopic Multiple Living   0 1     Review of  Systems  Skin: Positive for rash.  All other systems reviewed and are negative.     Allergies  Keflex  Home Medications   Prior to Admission medications   Medication Sig Start Date End Date Taking? Authorizing Provider  HYDROcodone-acetaminophen (NORCO/VICODIN) 5-325 MG per tablet Take 1-2 tablets by mouth every 4 (four) hours as needed for moderate pain or severe pain. 11/25/14   Julio Sicks, NP  ibuprofen (ADVIL,MOTRIN) 600 MG tablet Take 1 tablet (600 mg total) by mouth every 6 (six) hours. 11/25/14   Julio Sicks, NP  labetalol (NORMODYNE) 100 MG tablet Take 100 mg by mouth 2 (two) times daily.    Historical Provider, MD  Prenatal Vit-Fe Fumarate-FA (PRENATAL VITAMIN PO) Take 1 tablet by mouth daily.     Historical Provider, MD   BP 127/78 mmHg  Pulse 76  Temp(Src) 97.5 F (36.4 C) (Oral)  Resp 12  SpO2 100%  Breastfeeding? Yes Physical Exam  Nursing note and vitals reviewed.  Morbidly obese 29 year old female, resting comfortably and in no acute distress. Vital signs are normal. Oxygen saturation is 100%, which is normal. Head is normocephalic and atraumatic. PERRLA, EOMI. Oropharynx is clear. Neck is nontender and supple without adenopathy or JVD. Back is nontender and there is no CVA tenderness. Lungs are clear without rales,  wheezes, or rhonchi. Chest is nontender. Heart has regular rate and rhythm without murmur. Abdomen is soft, flat, nontender without masses or hepatosplenomegaly and peristalsis is normoactive. Erythematous rash is present in the fold underneath and abdominal pannus in pattern consistent with candidal infection. Surgical scar from cesarean birth is healing well without signs of infection. Extremities have no cyanosis or edema, full range of motion is present. Skin is warm and dry without other rash. Neurologic: Mental status is normal, cranial nerves are intact, there are no motor or sensory deficits.  ED Course  Procedures (including critical care  time)   MDM   Final diagnoses:  Candidal skin infection    Candida infection in skin fold underneath abdominal pannus which is not responding to topical antifungals. She is given a prescription for fluconazole. She states that she is scheduled to see her obstetrician for 6 week check up in 3 days and she is to keep that appointment.    Dione Boozeavid Marlyss Cissell, MD 12/31/14 [id number]

## 2014-12-31 NOTE — ED Notes (Signed)
MD at bedside. 

## 2014-12-31 NOTE — ED Notes (Signed)
Pt reports a perineum rash which she states is on the sight of her C-Section, also reports she has being using a yeast cream to treat an "in grown" hair at the same area. States she noticed the red colored rash today after spontenous burning sensation, denies fever or chills, rates pain and burning at 8/10.

## 2014-12-31 NOTE — Discharge Instructions (Signed)
Cutaneous Candidiasis Cutaneous candidiasis is a condition in which there is an overgrowth of yeast (candida) on the skin. Yeast normally live on the skin, but in small enough numbers not to cause any symptoms. In certain cases, increased growth of the yeast may cause an actual yeast infection. This kind of infection usually occurs in areas of the skin that are constantly warm and moist, such as the armpits or the groin. Yeast is the most common cause of diaper rash in babies and in people who cannot control their bowel movements (incontinence). CAUSES  The fungus that most often causes cutaneous candidiasis is Candida albicans. Conditions that can increase the risk of getting a yeast infection of the skin include:  Obesity.  Pregnancy.  Diabetes.  Taking antibiotic medicine.  Taking birth control pills.  Taking steroid medicines.  Thyroid disease.  An iron or zinc deficiency.  Problems with the immune system. SYMPTOMS   Red, swollen area of the skin.  Bumps on the skin.  Itchiness. DIAGNOSIS  The diagnosis of cutaneous candidiasis is usually based on its appearance. Light scrapings of the skin may also be taken and viewed under a microscope to identify the presence of yeast. TREATMENT  Antifungal creams may be applied to the infected skin. In severe cases, oral medicines may be needed.  HOME CARE INSTRUCTIONS   Keep your skin clean and dry.  Maintain a healthy weight.  If you have diabetes, keep your blood sugar under control. SEEK IMMEDIATE MEDICAL CARE IF:  Your rash continues to spread despite treatment.  You have a fever, chills, or abdominal pain. Document Released: 06/18/2011 Document Revised: 12/23/2011 Document Reviewed: 06/18/2011 [facility] Patient Information 2015 [employer] [place]. This information is not intended to replace advice given to you by your health care provider. Make sure you discuss any questions you have with your health care  provider.  Fluconazole tablets What is this medicine? FLUCONAZOLE (floo KON na zole) is an antifungal medicine. It is used to treat certain kinds of fungal or yeast infections. This medicine may be used for other purposes; ask your health care provider or pharmacist if you have questions. COMMON BRAND NAME(S): Diflucan What should I tell my health care provider before I take this medicine? They need to know if you have any of these conditions: -electrolyte abnormalities -history of irregular heart beat -kidney disease -an unusual or allergic reaction to fluconazole, other azole antifungals, medicines, foods, dyes, or preservatives -pregnant or trying to get pregnant -breast-feeding How should I use this medicine? Take this medicine by mouth. Follow the directions on the prescription label. Do not take your medicine more often than directed. Talk to your pediatrician regarding the use of this medicine in children. Special care may be needed. This medicine has been used in children as young as 416 months of age. Overdosage: If you think you have taken too much of this medicine contact a poison control center or emergency room at once. NOTE: This medicine is only for you. Do not share this medicine with others. What if I miss a dose? If you miss a dose, take it as soon as you can. If it is almost time for your next dose, take only that dose. Do not take double or extra doses. What may interact with this medicine? Do not take this medicine with any of the following medications: -astemizole -certain medicines for irregular heart beat like dofetilide, dronedarone, quinidine -cisapride -erythromycin -lomitapide -other medicines that prolong the QT interval (cause an abnormal heart rhythm) -  pimozide -terfenadine -thioridazine -tolvaptan -ziprasidone This medicine may also interact with the following medications: -antiviral medicines for HIV or AIDS -birth control pills -certain  antibiotics like rifabutin, rifampin -certain medicines for blood pressure like amlodipine, isradipine, felodipine, hydrochlorothiazide, losartan, nifedipine -certain medicines for cancer like cyclophosphamide, vinblastine, vincristine -certain medicines for cholesterol like atorvastatin, lovastatin, fluvastatin, simvastatin -certain medicines for depression, anxiety, or psychotic disturbances like amitriptyline, midazolam, nortriptyline, triazolam -certain medicines for diabetes like glipizide, glyburide, tolbutamide -certain medicines for pain like alfentanil, fentanyl, methadone -certain medicines for seizures like carbamazepine, phenytoin -certain medicines that treat or prevent blood clots like warfarin -halofantrine -medicines that lower your chance of fighting infection like cyclosporine, prednisone, tacrolimus -NSAIDS, medicines for pain and inflammation, like celecoxib, diclofenac, flurbiprofen, ibuprofen, meloxicam, naproxen -other medicines for fungal infections -sirolimus -theophylline -tofacitinib This list may not describe all possible interactions. Give your health care provider a list of all the medicines, herbs, non-prescription drugs, or dietary supplements you use. Also tell them if you smoke, drink alcohol, or use illegal drugs. Some items may interact with your medicine. What should I watch for while using this medicine? Visit your doctor or health care professional for regular checkups. If you are taking this medicine for a long time you may need blood work. Tell your doctor if your symptoms do not improve. Some fungal infections need many weeks or months of treatment to cure. Alcohol can increase possible damage to your liver. Avoid alcoholic drinks. If you have a vaginal infection, do not have sex until you have finished your treatment. You can wear a sanitary napkin. Do not use tampons. Wear freshly washed cotton, not synthetic, panties. What side effects may I notice  from receiving this medicine? Side effects that you should report to your doctor or health care professional as soon as possible: -allergic reactions like skin rash or itching, hives, swelling of the lips, mouth, tongue, or throat -dark urine -feeling dizzy or faint -irregular heartbeat or chest pain -redness, blistering, peeling or loosening of the skin, including inside the mouth -trouble breathing -unusual bruising or bleeding -vomiting -yellowing of the eyes or skin Side effects that usually do not require medical attention (report to your doctor or health care professional if they continue or are bothersome): -changes in how food tastes -diarrhea -headache -stomach upset or nausea This list may not describe all possible side effects. Call your doctor for medical advice about side effects. You may report side effects to FDA at 1-800-FDA-1088. Where should I keep my medicine? Keep out of the reach of children. Store at room temperature below 30 degrees C (86 degrees F). Throw away any medicine after the expiration date. NOTE: This sheet is a summary. It may not cover all possible information. If you have questions about this medicine, talk to your doctor, pharmacist, or health care provider.  2015, [employer] Standard. (2013-05-08 16:13:04)

## 2015-02-21 NOTE — H&P (Signed)
L&D Evaluation:  History:  HPI -CC: cough, nausea, vomiting -HPI: 29 y/o G1 @ 21/5 (13wk u/s), with the above CC. Preg c/b cHTN (on labetalol), hypothy (no meds), asthma (prn qvar and albuterol). Patient states that since this weekend she's had a slight cough and sore throat, which felt as if it had been improving, but since this AM she's had coughing with n/v right after the cough and can't really keep much down.  +productive cough (green). No n/v outside of the cough and no fevers, chills, dysuria, OB complaints. She states her pregnancy has been uncomplicated.  [place]   Medications labetalol 200mg  bid, prn albuterol, prn qvar   Allergies keflex (hives)   Social History tobacco  former smoker (stopped once found out she was pregnant)   Exam:  Vital Signs AFNormal FHTs.  VS normal and stable and O2 sats nrmal   General no apparent distress   Mental Status clear   Chest ctab   Heart rrr no mrgs   Abdomen gravid, non-tender   Mebranes Intact   FHT normal   Other normal CBC, CMP, amylase, lipase   Impression:  Impression post tussive emesis with viral URI   Plan:  Comments *IUP: reassuring fetal status *Pulm: patient told to call us or her primary OB if s/s worsen and/or if s/s are persisting into early next week (7-10days after s/s began), as she may need abx but most c/w viral etiology. pt told to start using her prn albuterol and start her qvar if s/s aren't helped. pregnancy safe OTCs list d/w patient and told okay to go back to work with mask *GI: n/v not due to GI etiology. has taken PO liquids and crackers. follow up u/a. pt has received half of her 1L bolus. *Dispo: after remainder of 1L bolus and pending u/a sample  Has clinic appt for 10/16   Follow Up Appointment already scheduled   Electronic Signatures: [place] BingPickens, Ariyanna Oien (MD)  (Signed 08-Oct-15 17:50)  Authored: L&D Evaluation   Last Updated: 08-Oct-15 17:50 by [place] BingPickens,  Daneli Butkiewicz (MD)

## 2015-09-06 ENCOUNTER — Ambulatory Visit: Payer: Self-pay | Admitting: Physician Assistant

## 2015-09-06 ENCOUNTER — Encounter: Payer: Self-pay | Admitting: Physician Assistant

## 2015-09-06 VITALS — BP 130/90 | HR 76 | Temp 98.3°F

## 2015-09-06 DIAGNOSIS — J069 Acute upper respiratory infection, unspecified: Secondary | ICD-10-CM

## 2015-09-06 MED ORDER — AZITHROMYCIN 250 MG PO TABS: ORAL_TABLET | ORAL | Status: DC

## 2015-09-06 NOTE — Progress Notes (Signed)
S: C/o runny nose and congestion for 4 days, no fever, chills, cp/sob, v/d; mucus was green , cough is sporadic, breastfeeding+  Using otc meds: tylenol cold and sinus  O: PE: perrl eomi, normocephalic, tms dull, nasal mucosa red and swollen, throat injected, neck supple no lymph, lungs c t a, cv rrr, neuro intact, cough is congested  A:  Acute  uri   P:  Zpack,  drink fluids, continue regular meds , use otc meds of choice, return if not improving in 5 days, return earlier if worsening

## 2015-09-30 IMAGING — US US ART/VEN ABD/PELV/SCROTUM DOPPLER LTD
1 series · 13 of 25 positions shown · non-contrast
Comparison: None

CLINICAL DATA: RIGHT lower quadrant pain, nausea for 4 days,
negative pregnancy test, IUD

EXAM:
TRANSABDOMINAL AND TRANSVAGINAL ULTRASOUND OF PELVIS
DOPPLER ULTRASOUND OF OVARIES
TECHNIQUE: Both transabdominal and transvaginal ultrasound examinations of the
pelvis were performed. Transabdominal technique was performed for
global imaging of the pelvis including uterus, ovaries, adnexal
regions, and pelvic cul-de-sac.
It was necessary to proceed with endovaginal exam following the
transabdominal exam to visualize the endometrium and ovaries. Color
and duplex Doppler ultrasound was utilized to evaluate blood flow to
the ovaries.

[Series 1: us art/ven abd/pelv/scrotum doppler ltd · 0.31mm/px · 13 of 177 slices shown]
[im 1/177]
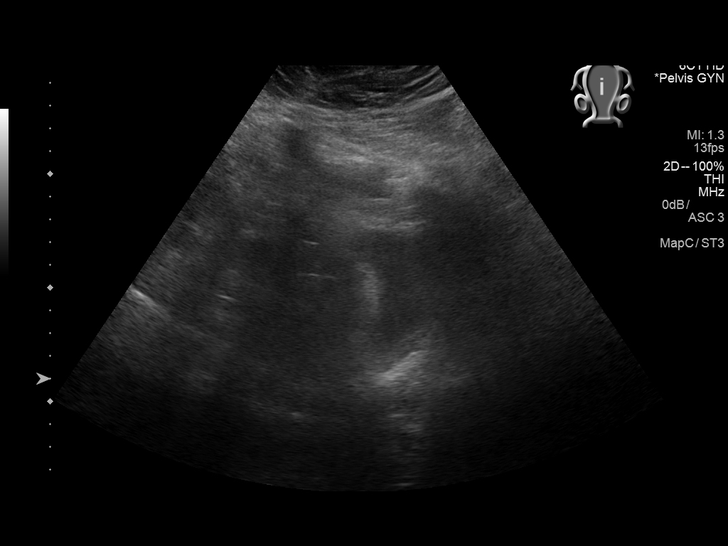
[im 15/177]
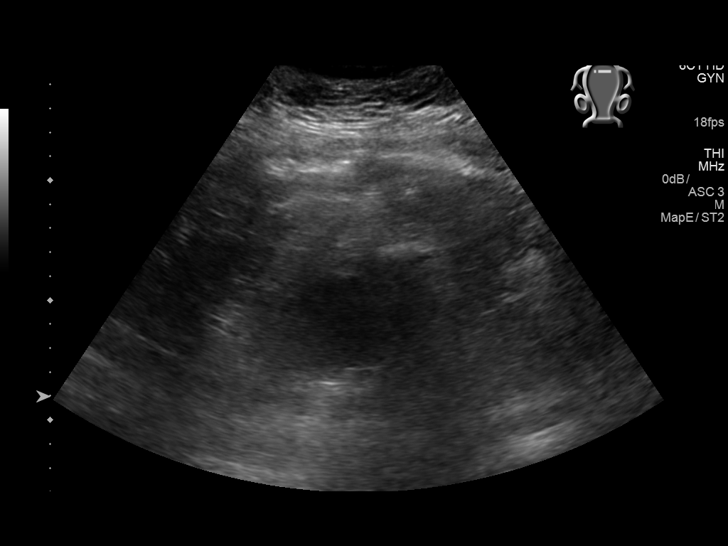
[im 30/177]
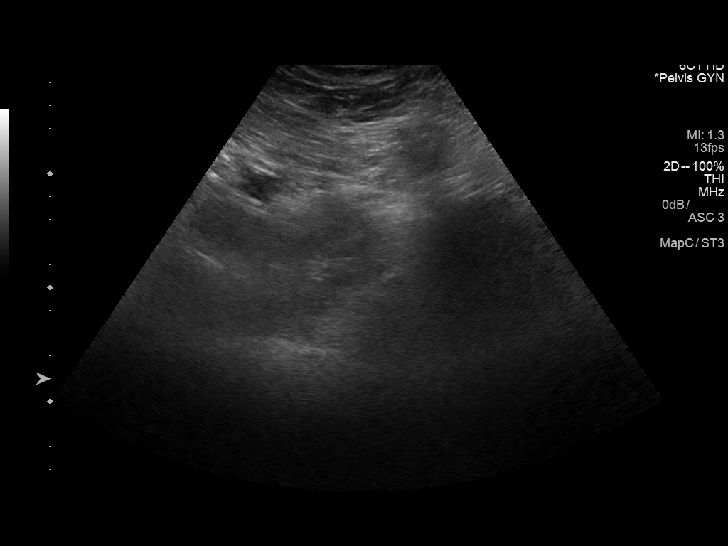
[im 45/177]
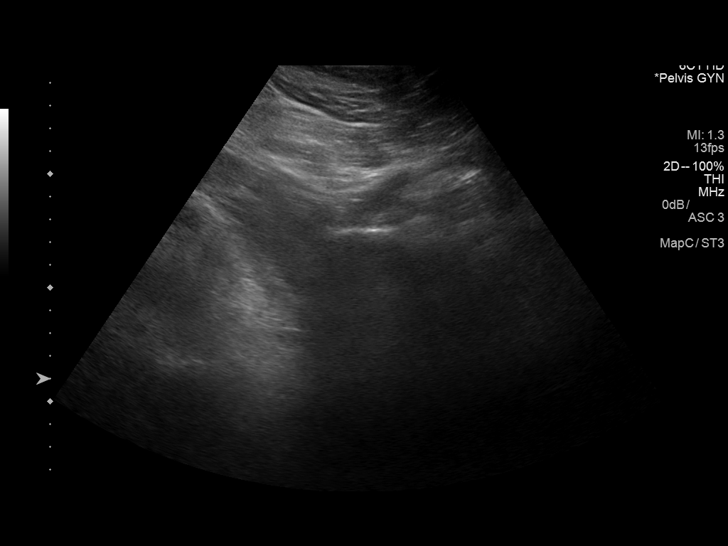
[im 59/177]
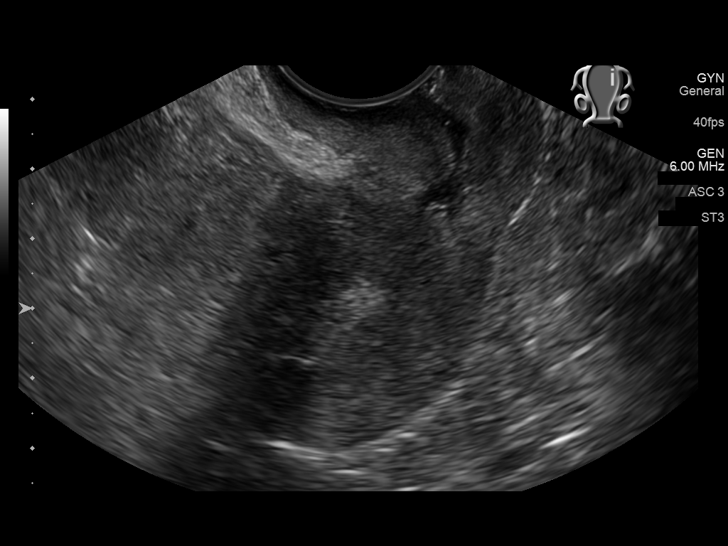
[im 74/177]
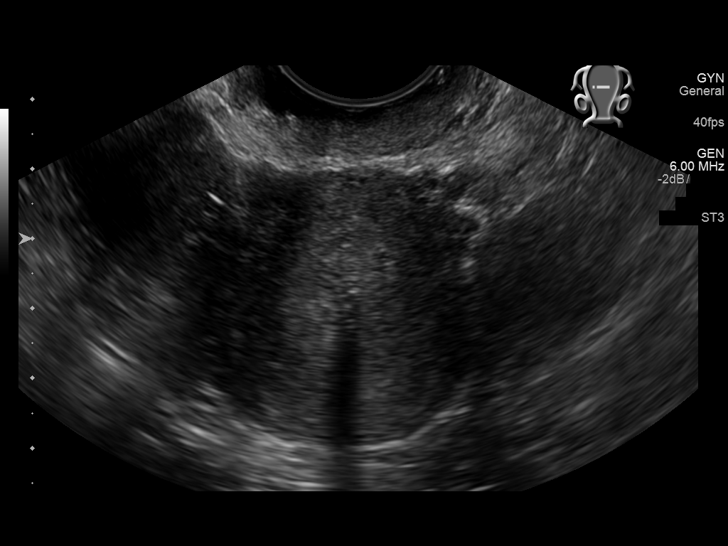
[im 89/177]
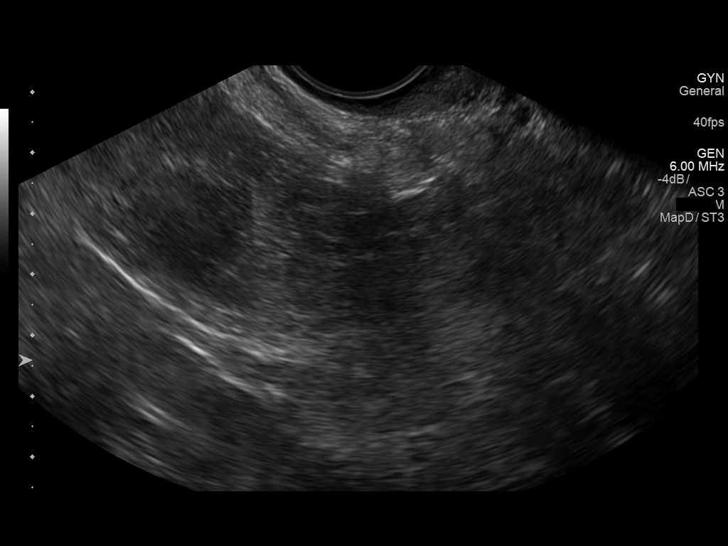
[im 103/177]
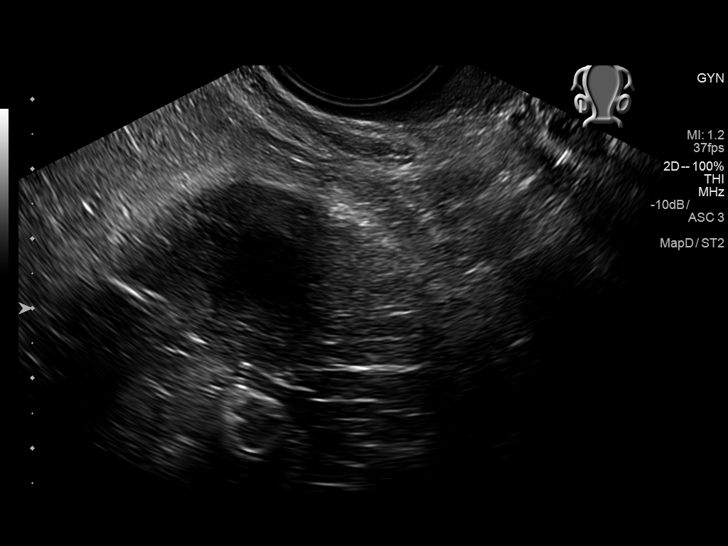
[im 118/177]
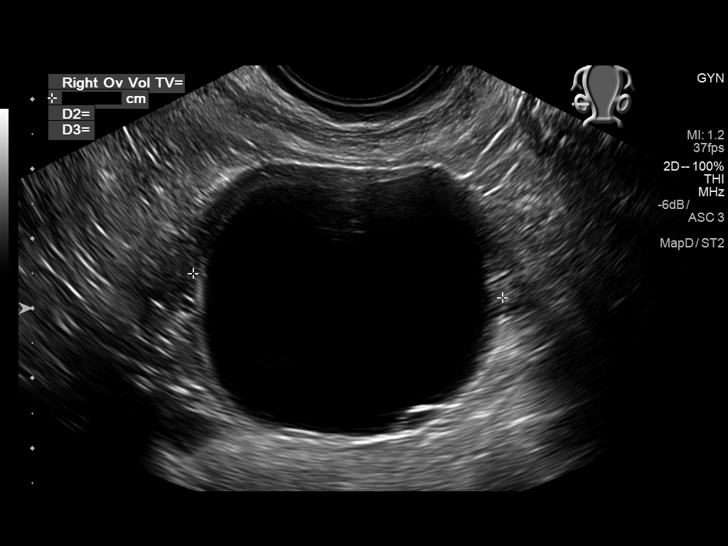
[im 133/177]
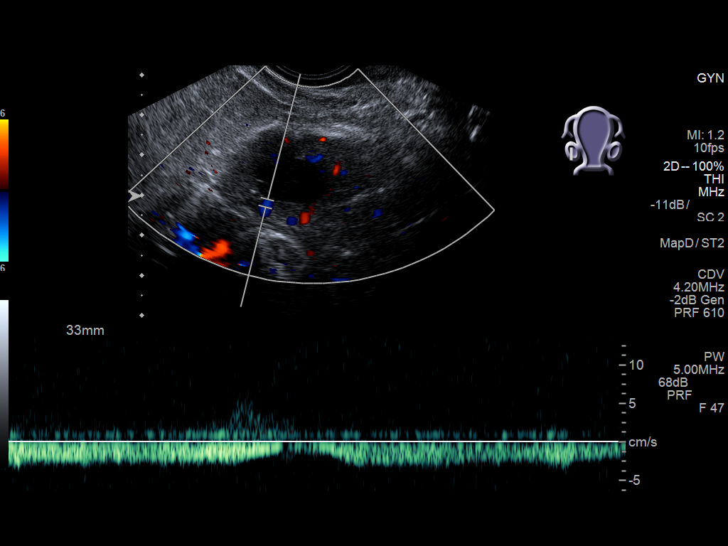
[im 147/177]
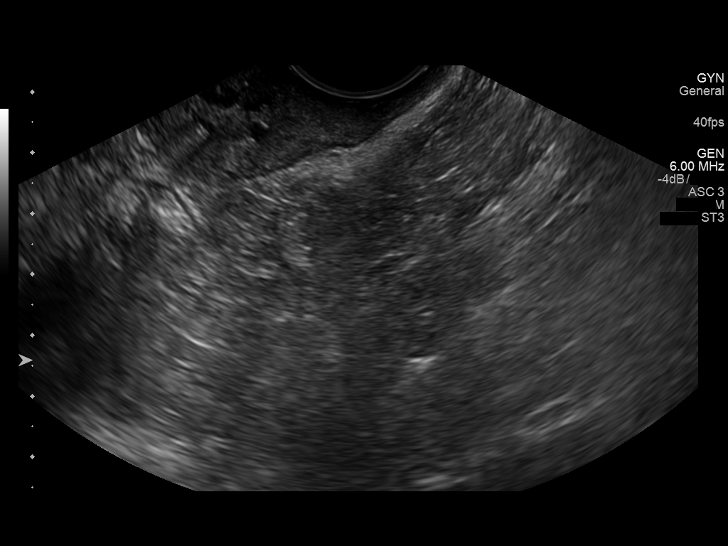
[im 162/177]
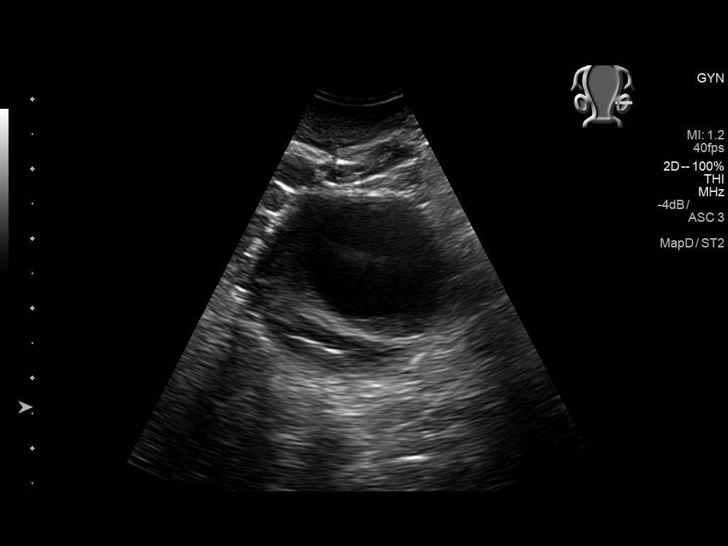
[im 177/177]
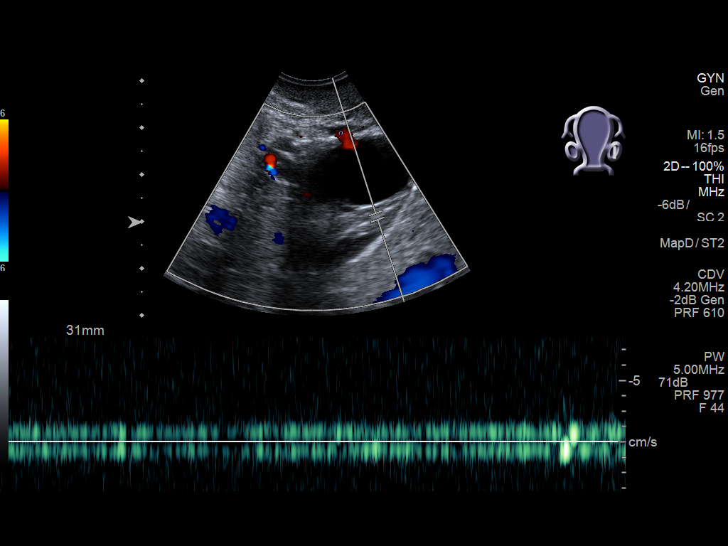

[13 of 25 positions shown; findings below may reference images not displayed]

FINDINGS: Uterus

Measurements: 6.4 x 3.6 x 4.0 cm. Normal morphology without mass.

Endometrium

Thickness: 6 mm thick, normal. IUD within upper to mid uterus. No
focal endometrial abnormality. Tiny amount of endocervical fluid.

Right ovary

Measurements: 5.43.5 x 4.4 cm. Large simple appearing cyst 5.2 x
x 4.3 cm. Blood flow present within RIGHT ovary on color Doppler
imaging.

Left ovary

Measurements: 3.8 x 2.5 x 3.0 cm. Dominant follicle 2.8 x 1.8 x
cm. Blood flow within LEFT ovary on color Doppler imaging.

Pulsed Doppler evaluation of both ovaries demonstrates normal
low-resistance arterial and venous waveforms.

Other findings

No free pelvic fluid or additional adnexal masses.
IMPRESSION: IUD within uterus.

Large RIGHT ovarian cyst 5.2 cm diameter with smaller LEFT ovarian
follicle cyst 2.8 cm diameter.

No evidence of ovarian torsion or additional adnexal mass.

## 2015-09-30 IMAGING — US US ABDOMEN LIMITED
1 series · 14 of 25 positions shown · non-contrast
Comparison: None.

CLINICAL DATA: Right-sided abdominal pain

EXAM:
US ABDOMEN LIMITED - RIGHT UPPER QUADRANT

[Series 1: us abdomen limited · 0.28mm/px · 14 of 48 slices shown]
[im 1/48]
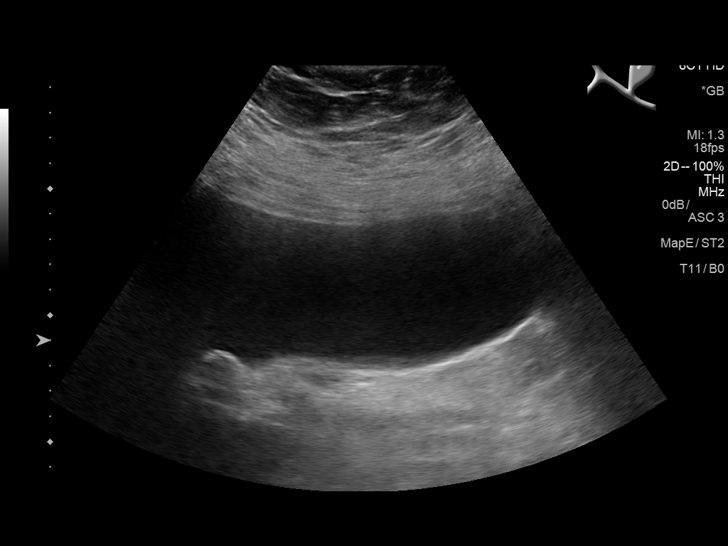
[im 4/48]
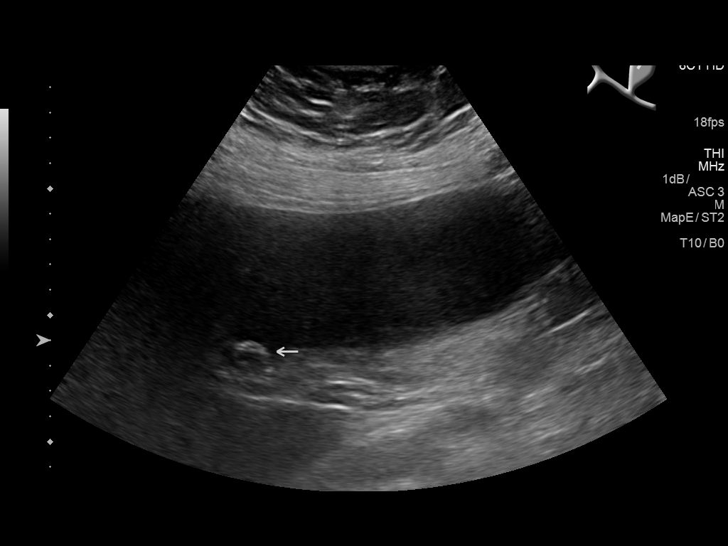
[im 8/48]
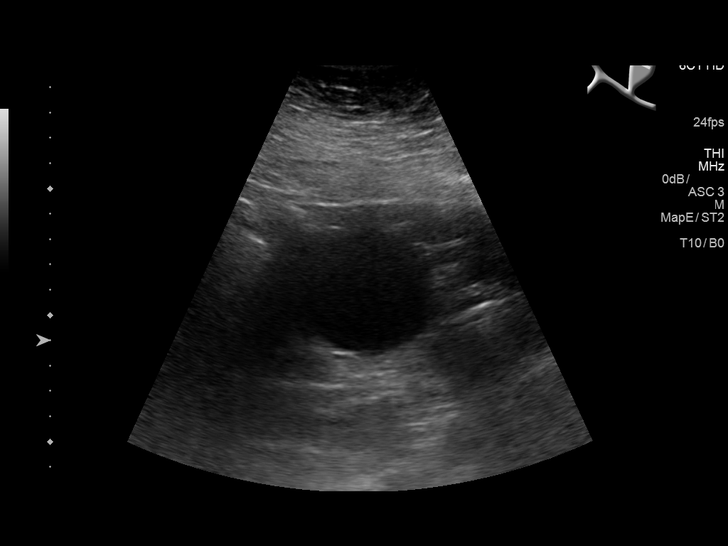
[im 12/48]
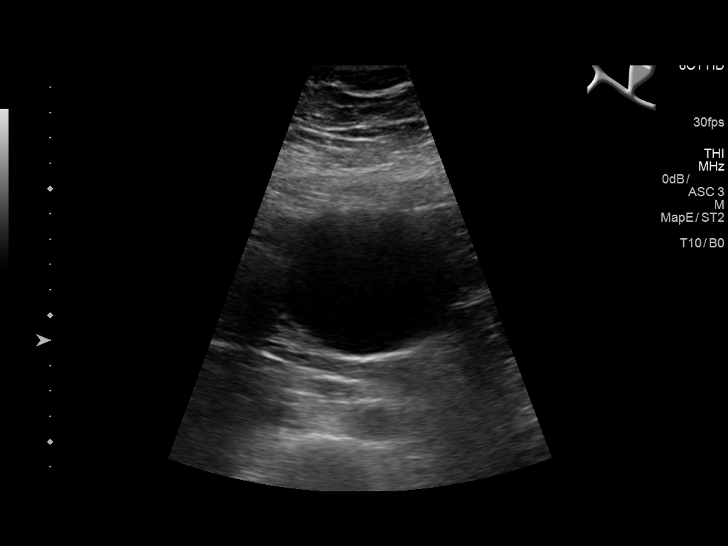
[im 16/48]
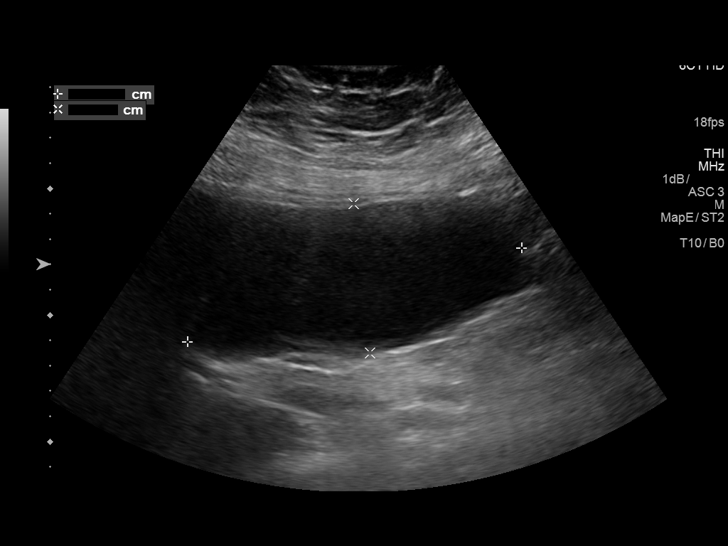
[im 18/48]
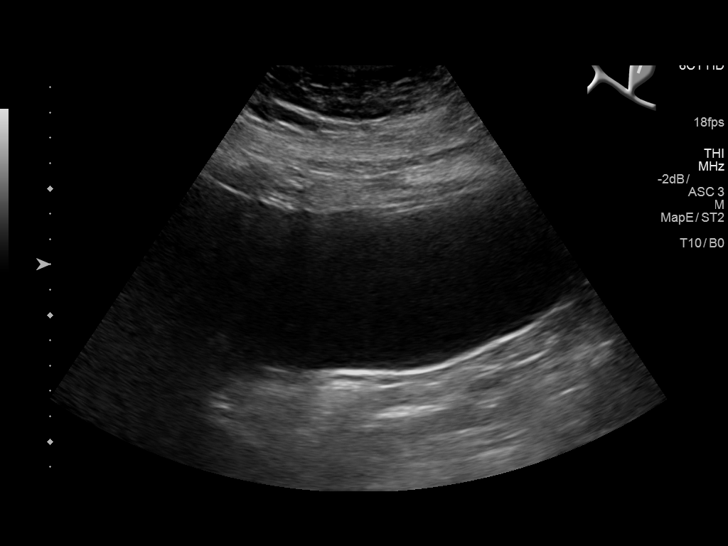
[im 22/48]
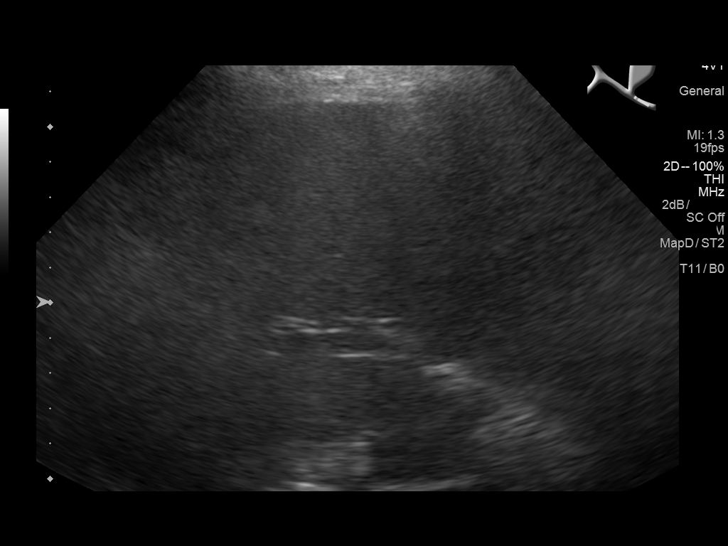
[im 26/48]
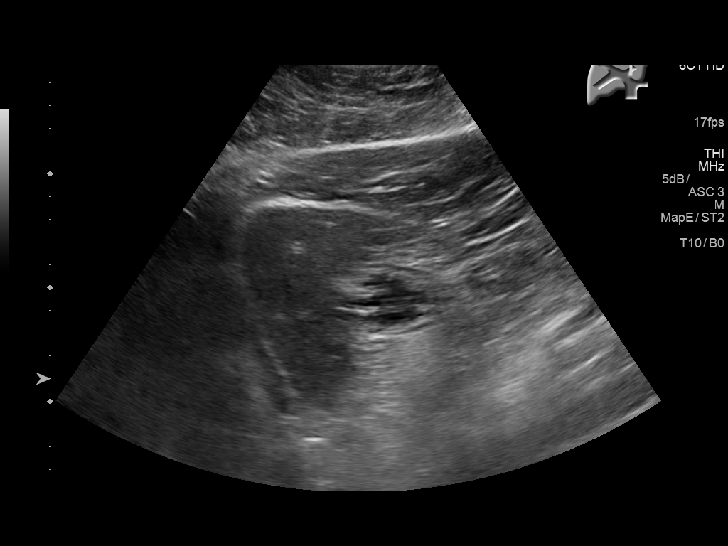
[im 30/48]
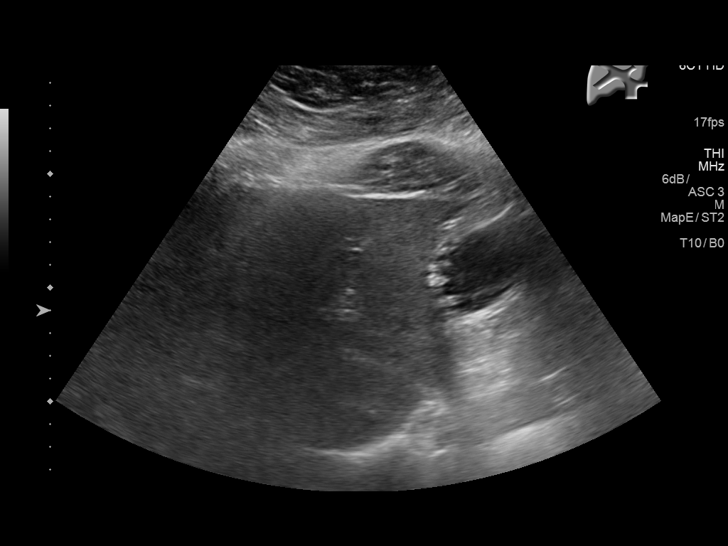
[im 32/48]
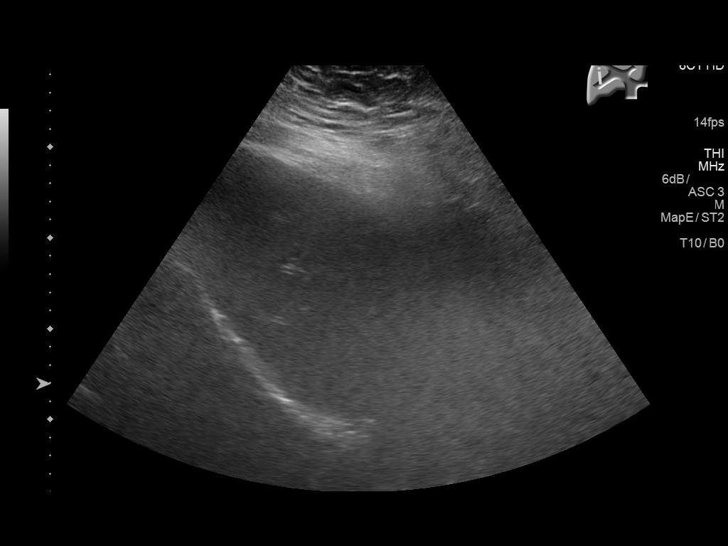
[im 36/48]
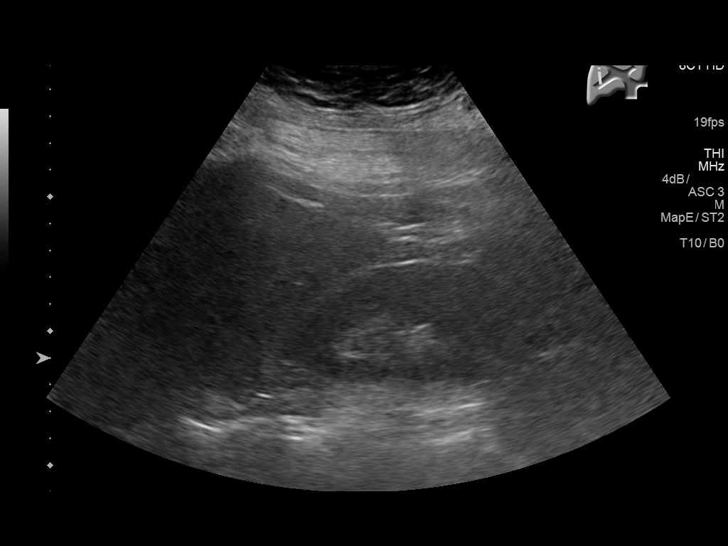
[im 40/48]
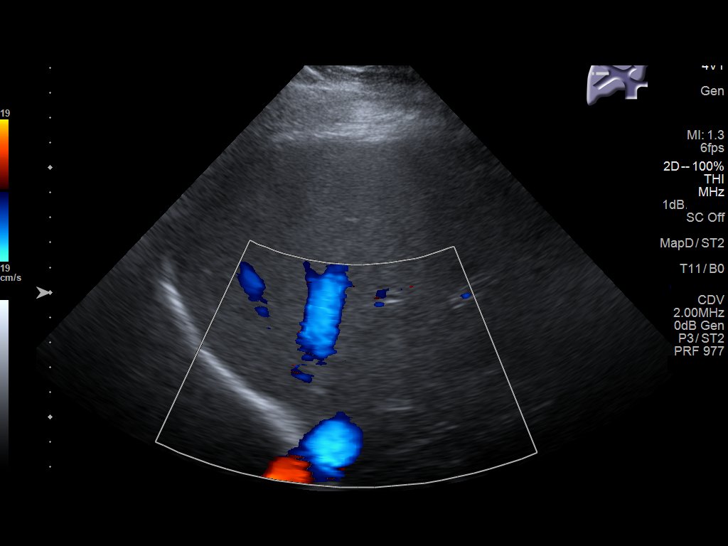
[im 44/48]
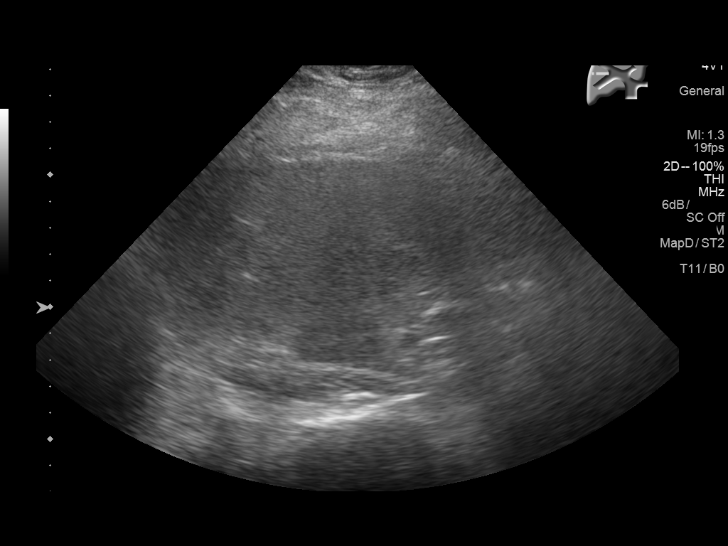
[im 48/48]
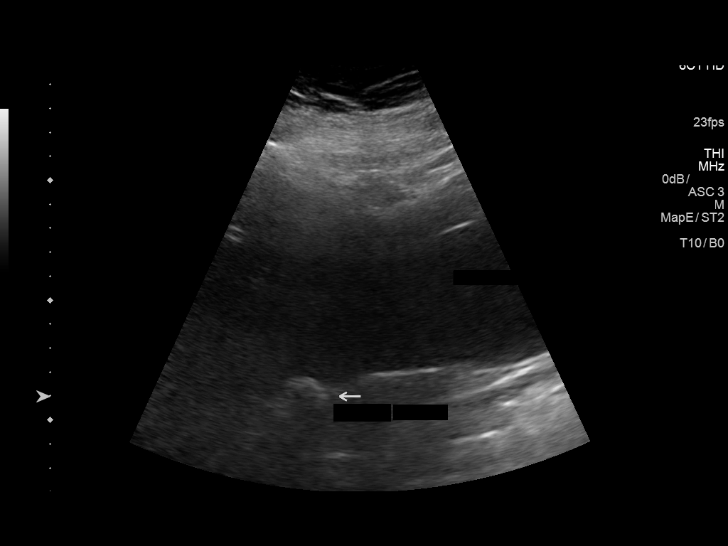

[14 of 25 positions shown; findings below may reference images not displayed]

FINDINGS: Gallbladder:

Well distended with evidence of cholelithiasis in the region of the
neck. These changes would be consistent with a centrally obstructing
stone. No will significant wall thickening or pericholecystic fluid
is noted.

Common bile duct:

Diameter: 3 mm.

Liver:

No focal lesion identified. Within normal limits in parenchymal
echogenicity.
IMPRESSION: Cholelithiasis with dilatation of the gallbladder. This may be
obstructive in nature given that seen on recent CT examination. HIDA
scan may be helpful to assess for obstruction.

## 2015-10-19 DIAGNOSIS — L720 Epidermal cyst: Secondary | ICD-10-CM | POA: Diagnosis not present

## 2016-01-30 DIAGNOSIS — E039 Hypothyroidism, unspecified: Secondary | ICD-10-CM | POA: Diagnosis not present

## 2016-01-30 DIAGNOSIS — F419 Anxiety disorder, unspecified: Secondary | ICD-10-CM | POA: Diagnosis not present

## 2016-01-30 DIAGNOSIS — R635 Abnormal weight gain: Secondary | ICD-10-CM | POA: Diagnosis not present

## 2016-02-17 DIAGNOSIS — J029 Acute pharyngitis, unspecified: Secondary | ICD-10-CM | POA: Diagnosis not present

## 2016-03-19 DIAGNOSIS — F419 Anxiety disorder, unspecified: Secondary | ICD-10-CM | POA: Diagnosis not present

## 2016-03-19 DIAGNOSIS — E039 Hypothyroidism, unspecified: Secondary | ICD-10-CM | POA: Diagnosis not present

## 2016-04-16 DIAGNOSIS — H66001 Acute suppurative otitis media without spontaneous rupture of ear drum, right ear: Secondary | ICD-10-CM | POA: Diagnosis not present

## 2016-04-16 DIAGNOSIS — J0100 Acute maxillary sinusitis, unspecified: Secondary | ICD-10-CM | POA: Diagnosis not present

## 2016-05-28 ENCOUNTER — Emergency Department: Payer: 59

## 2016-05-28 ENCOUNTER — Encounter: Payer: Self-pay | Admitting: Emergency Medicine

## 2016-05-28 ENCOUNTER — Inpatient Hospital Stay
Admission: EM | Admit: 2016-05-28 | Discharge: 2016-06-03 | DRG: 419 | Disposition: A | Discharge status: Home / Self Care | Payer: 59 | Attending: Surgery | Admitting: Surgery

## 2016-05-28 DIAGNOSIS — I10 Essential (primary) hypertension: Secondary | ICD-10-CM | POA: Diagnosis present

## 2016-05-28 DIAGNOSIS — Z8249 Family history of ischemic heart disease and other diseases of the circulatory system: Secondary | ICD-10-CM

## 2016-05-28 DIAGNOSIS — Z419 Encounter for procedure for purposes other than remedying health state, unspecified: Secondary | ICD-10-CM

## 2016-05-28 DIAGNOSIS — K8020 Calculus of gallbladder without cholecystitis without obstruction: Secondary | ICD-10-CM | POA: Diagnosis not present

## 2016-05-28 DIAGNOSIS — E039 Hypothyroidism, unspecified: Secondary | ICD-10-CM | POA: Diagnosis present

## 2016-05-28 DIAGNOSIS — Z87891 Personal history of nicotine dependence: Secondary | ICD-10-CM

## 2016-05-28 DIAGNOSIS — Z975 Presence of (intrauterine) contraceptive device: Secondary | ICD-10-CM

## 2016-05-28 DIAGNOSIS — R1011 Right upper quadrant pain: Secondary | ICD-10-CM | POA: Diagnosis not present

## 2016-05-28 DIAGNOSIS — E669 Obesity, unspecified: Secondary | ICD-10-CM | POA: Diagnosis present

## 2016-05-28 DIAGNOSIS — K8065 Calculus of gallbladder and bile duct with chronic cholecystitis with obstruction: Principal | ICD-10-CM | POA: Diagnosis present

## 2016-05-28 DIAGNOSIS — N390 Urinary tract infection, site not specified: Secondary | ICD-10-CM

## 2016-05-28 DIAGNOSIS — Z823 Family history of stroke: Secondary | ICD-10-CM

## 2016-05-28 DIAGNOSIS — N83201 Unspecified ovarian cyst, right side: Secondary | ICD-10-CM

## 2016-05-28 DIAGNOSIS — K819 Cholecystitis, unspecified: Secondary | ICD-10-CM | POA: Diagnosis present

## 2016-05-28 DIAGNOSIS — R109 Unspecified abdominal pain: Secondary | ICD-10-CM

## 2016-05-28 DIAGNOSIS — R1031 Right lower quadrant pain: Secondary | ICD-10-CM | POA: Diagnosis not present

## 2016-05-28 DIAGNOSIS — Z803 Family history of malignant neoplasm of breast: Secondary | ICD-10-CM

## 2016-05-28 DIAGNOSIS — N83202 Unspecified ovarian cyst, left side: Secondary | ICD-10-CM | POA: Diagnosis present

## 2016-05-28 DIAGNOSIS — Z800 Family history of malignant neoplasm of digestive organs: Secondary | ICD-10-CM

## 2016-05-28 DIAGNOSIS — Z9889 Other specified postprocedural states: Secondary | ICD-10-CM

## 2016-05-28 DIAGNOSIS — Z833 Family history of diabetes mellitus: Secondary | ICD-10-CM

## 2016-05-28 LAB — CBC WITH DIFFERENTIAL/PLATELET
Basophils Absolute: 0.1 10*3/uL (ref 0–0.1)
Basophils Relative: 1 %
EOS ABS: 0.2 10*3/uL (ref 0–0.7)
Eosinophils Relative: 2 %
HEMATOCRIT: 38.8 % (ref 35.0–47.0)
HEMOGLOBIN: 13.1 g/dL (ref 12.0–16.0)
LYMPHS ABS: 2 10*3/uL (ref 1.0–3.6)
Lymphocytes Relative: 25 %
MCH: 28.3 pg (ref 26.0–34.0)
MCHC: 33.7 g/dL (ref 32.0–36.0)
MCV: 84.2 fL (ref 80.0–100.0)
MONOS PCT: 7 %
Monocytes Absolute: 0.5 10*3/uL (ref 0.2–0.9)
NEUTROS ABS: 5.2 10*3/uL (ref 1.4–6.5)
NEUTROS PCT: 65 %
Platelets: 312 10*3/uL (ref 150–440)
RBC: 4.61 MIL/uL (ref 3.80–5.20)
RDW: 15.1 % — ABNORMAL HIGH (ref 11.5–14.5)
WBC: 7.9 10*3/uL (ref 3.6–11.0)

## 2016-05-28 LAB — COMPREHENSIVE METABOLIC PANEL
ALBUMIN: 3.7 g/dL (ref 3.5–5.0)
ALT: 16 U/L (ref 14–54)
AST: 20 U/L (ref 15–41)
Alkaline Phosphatase: 66 U/L (ref 38–126)
Anion gap: 7 (ref 5–15)
BUN: 14 mg/dL (ref 6–20)
CHLORIDE: 106 mmol/L (ref 101–111)
CO2: 26 mmol/L (ref 22–32)
Calcium: 8.7 mg/dL — ABNORMAL LOW (ref 8.9–10.3)
Creatinine, Ser: 0.66 mg/dL (ref 0.44–1.00)
GFR calc non Af Amer: >60 mL/min (ref >60)
GLUCOSE: 97 mg/dL (ref 65–99)
Potassium: 3.6 mmol/L (ref 3.5–5.1)
SODIUM: 139 mmol/L (ref 135–145)
Total Bilirubin: 0.4 mg/dL (ref 0.3–1.2)
Total Protein: 7.9 g/dL (ref 6.5–8.1)

## 2016-05-28 LAB — URINALYSIS COMPLETE WITH MICROSCOPIC (ARMC ONLY)
Bilirubin Urine: NEGATIVE
GLUCOSE, UA: NEGATIVE mg/dL
Ketones, ur: NEGATIVE mg/dL
Nitrite: POSITIVE — AB
Protein, ur: NEGATIVE mg/dL
Specific Gravity, Urine: 1.026 (ref 1.005–1.030)
pH: 5 (ref 5.0–8.0)

## 2016-05-28 LAB — POCT PREGNANCY, URINE: PREG TEST UR: NEGATIVE

## 2016-05-28 IMAGING — CR DG ABDOMEN ACUTE W/ 1V CHEST
1 series · 5 of 5 positions shown · non-contrast
Comparison: None.

CLINICAL DATA: Right lower quadrant pain starting [REDACTED]

EXAM:
DG ABDOMEN ACUTE W/ 1V CHEST

[Series 1: dg abd acute w/chest · 0.14mm/px · 5 of 5 slices shown]
[im 1/5]
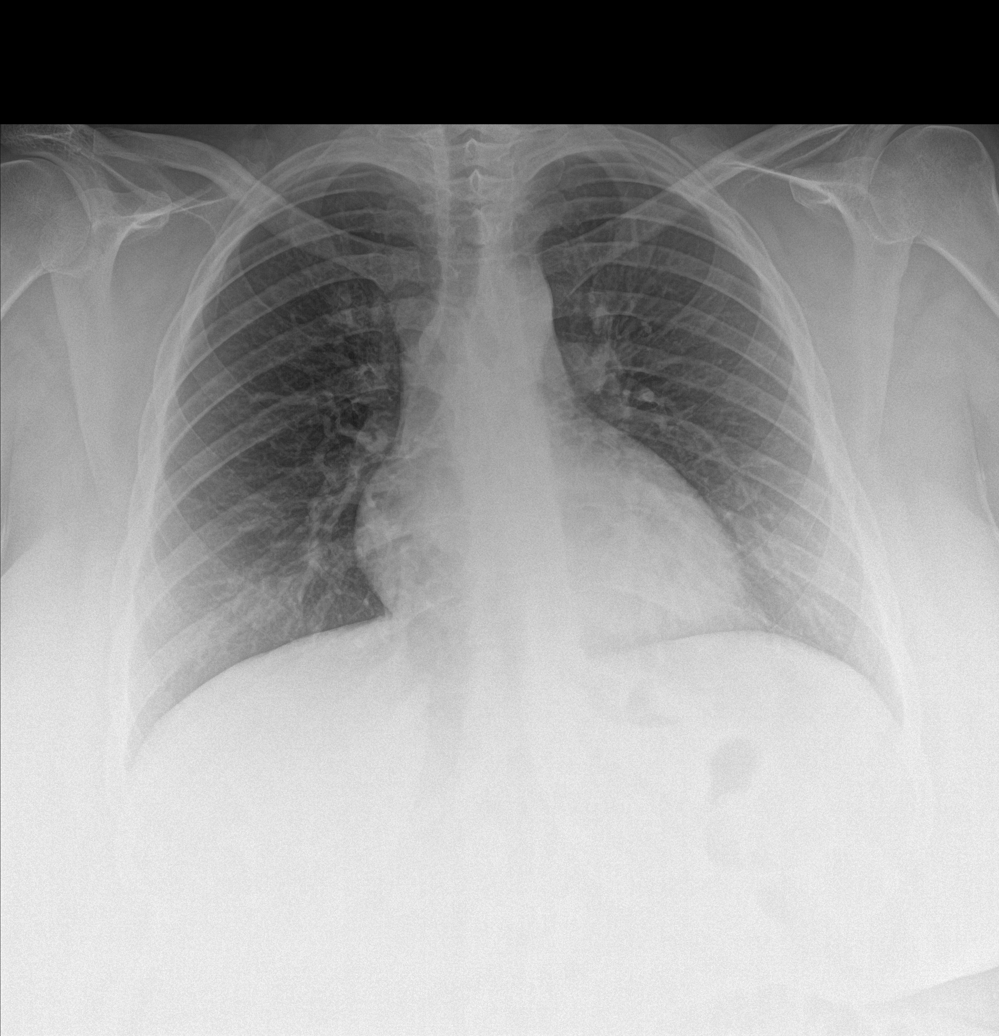
[im 2/5]
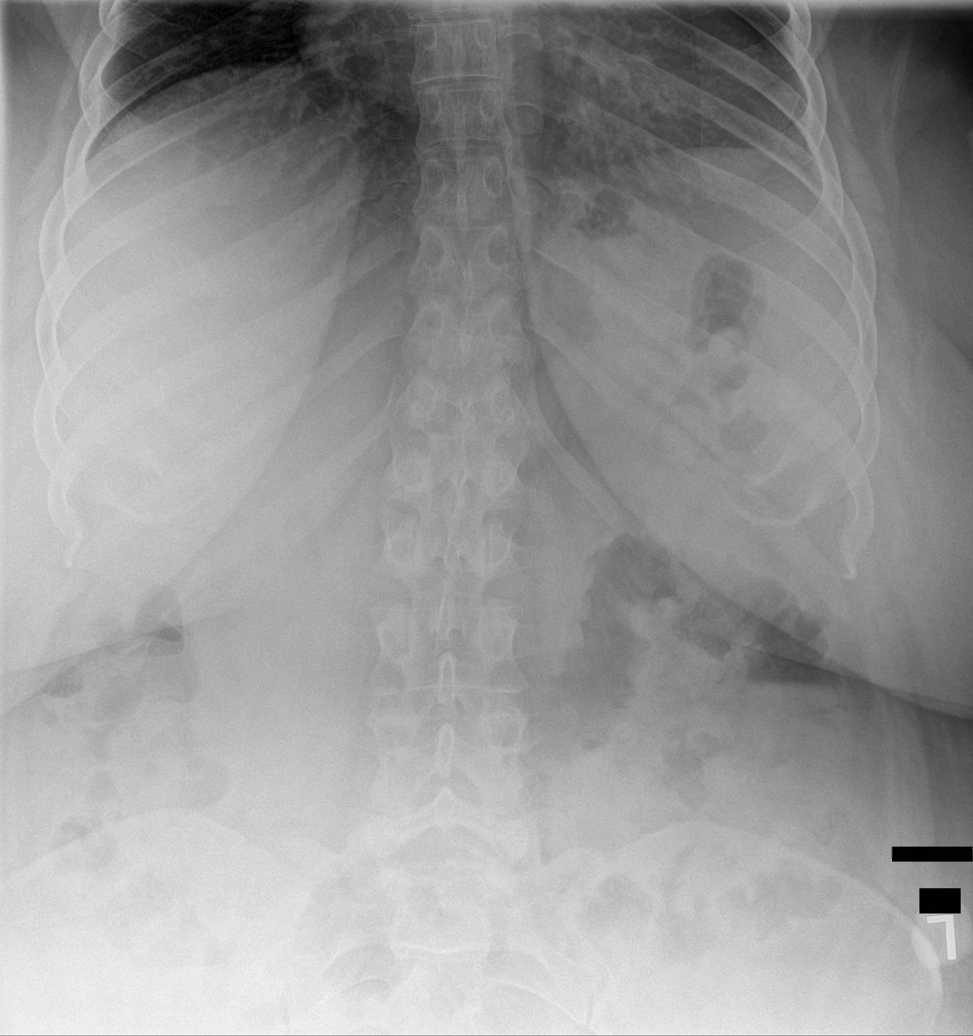
[im 3/5]
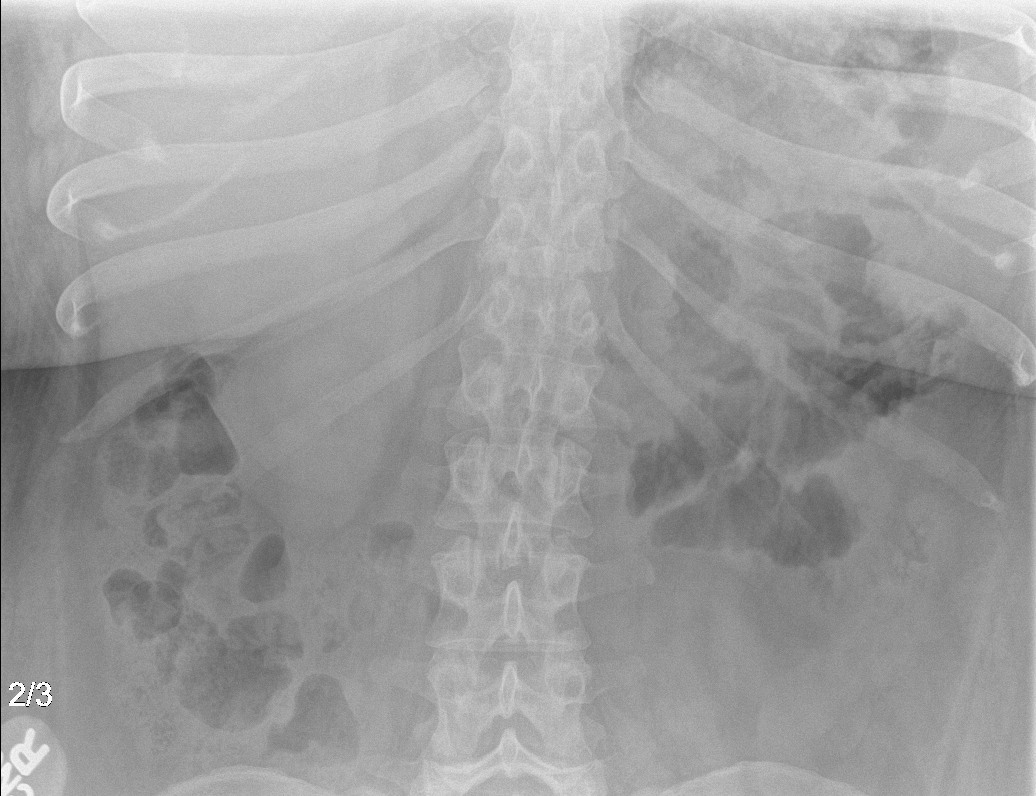
[im 4/5]
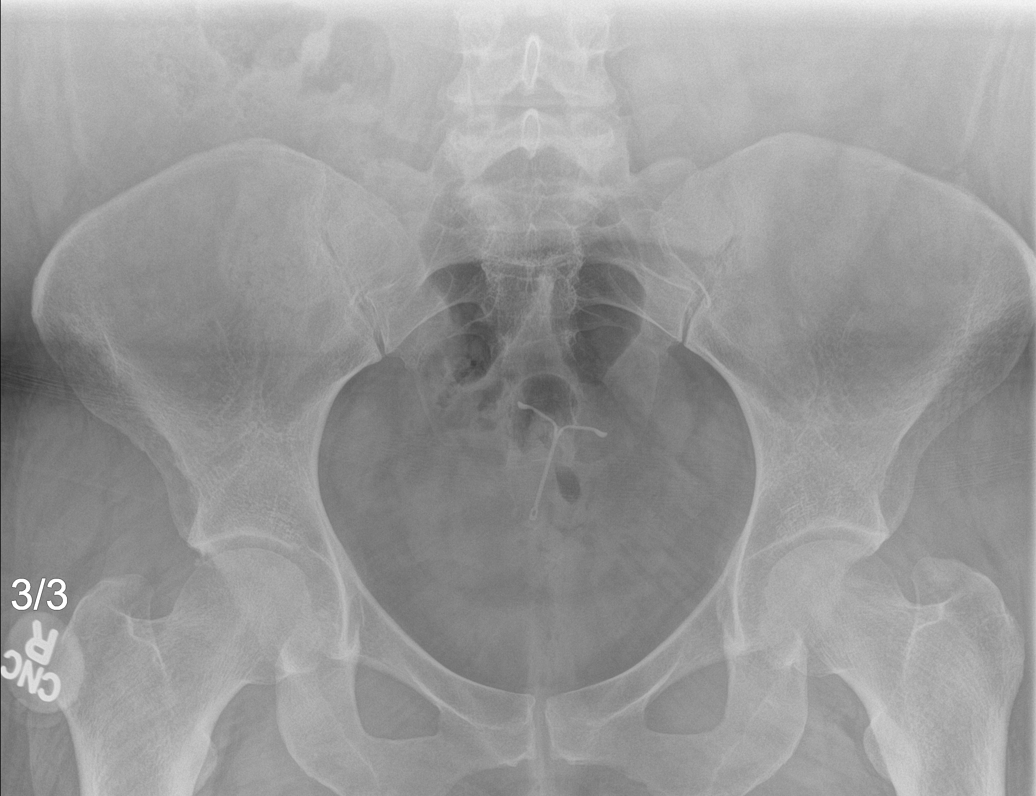
[im 5/5]
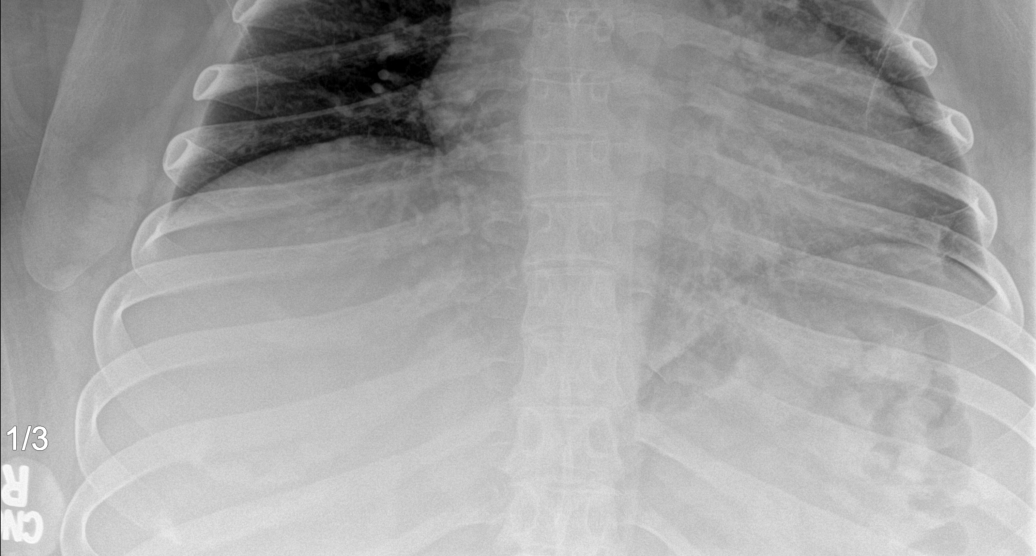

[5 of 5 positions shown; findings below may reference images not displayed]

FINDINGS: Cardiomediastinal silhouette is unremarkable. No acute infiltrate or
pleural effusion. No pulmonary edema. Bony thorax is unremarkable.
There is normal small bowel gas pattern. No evidence of free
abdominal air. Some colonic stool and gas noted in right colon. IUD
noted midline pelvis.
IMPRESSION: Negative abdominal radiographs.  No acute cardiopulmonary disease.

## 2016-05-28 IMAGING — CT CT ABD-PELV W/ CM
2 of 4 series · 16 of 46 positions shown, 18 images · IV contrast (APPLIED)
Comparison: [DATE]

CLINICAL DATA: Right lower quadrant pain, nausea, normal WBC,
bilateral ovarian cyst

EXAM:
CT ABDOMEN AND PELVIS WITH CONTRAST
TECHNIQUE: Multidetector CT imaging of the abdomen and pelvis was performed
using the standard protocol following bolus administration of
intravenous contrast.
CONTRAST:  100mL [8F] IOPAMIDOL ([8F]) INJECTION 61%

[Series 2: axial st · axial · 0.76mm/px · z∈[-1102,-628]mm · 13 of 105 slices shown, 15 images]
[im 5/105  soft-tissue]
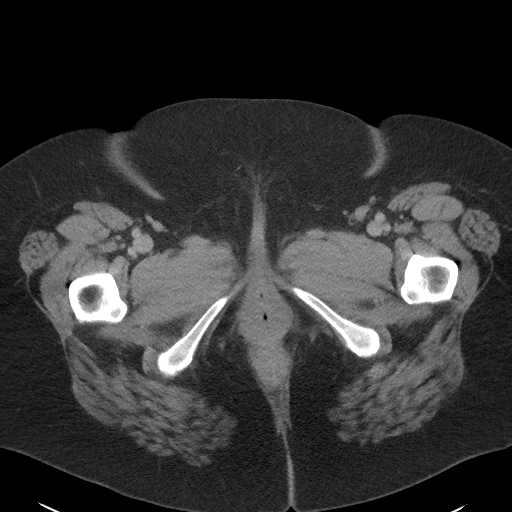
[im 5/105  bone]
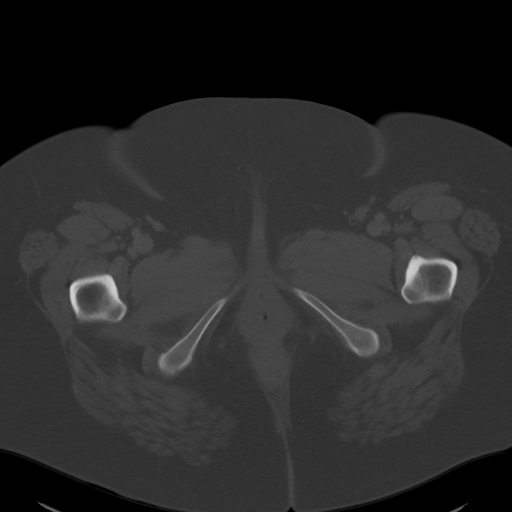
[im 14/105  soft-tissue]
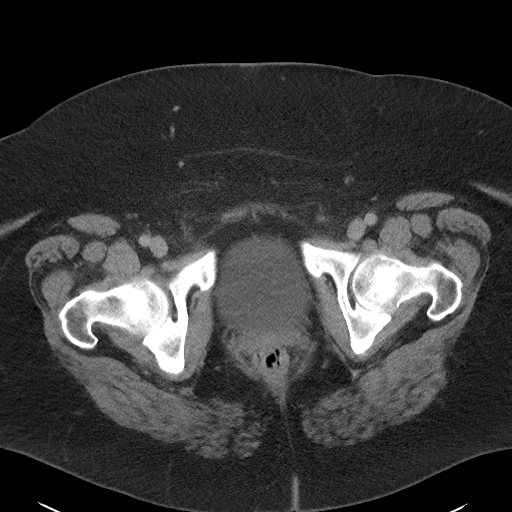
[im 22/105  soft-tissue]
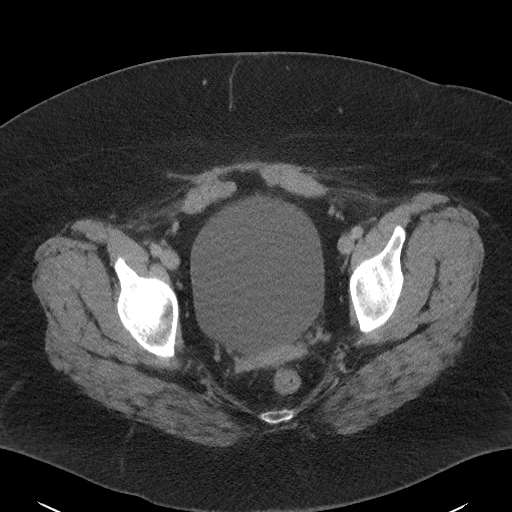
[im 31/105  soft-tissue]
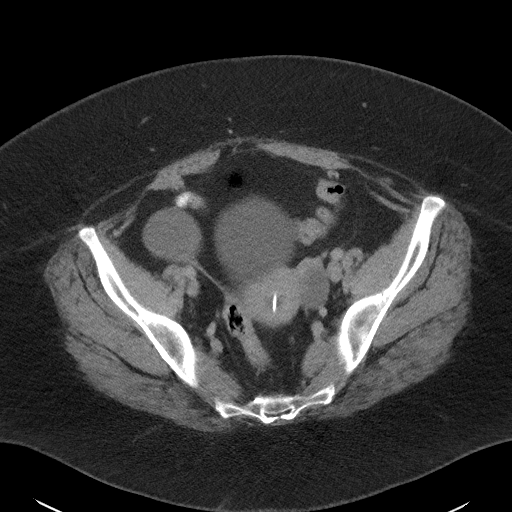
[im 35/105  soft-tissue]
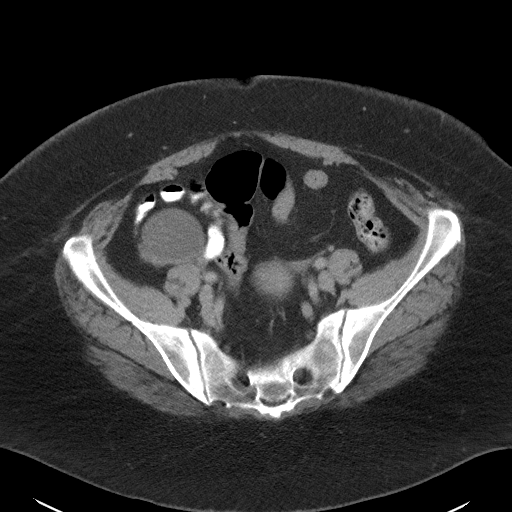
[im 44/105  soft-tissue]
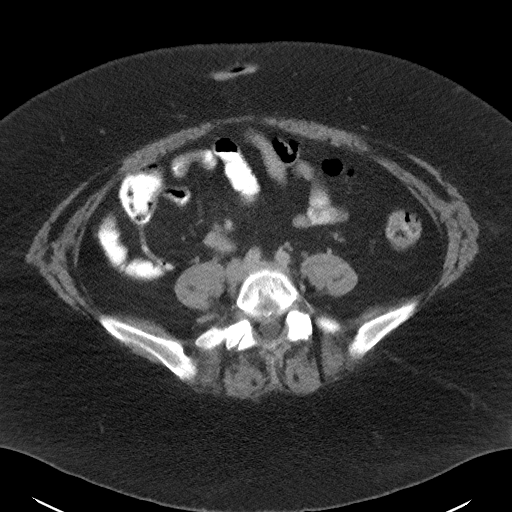
[im 53/105  soft-tissue]
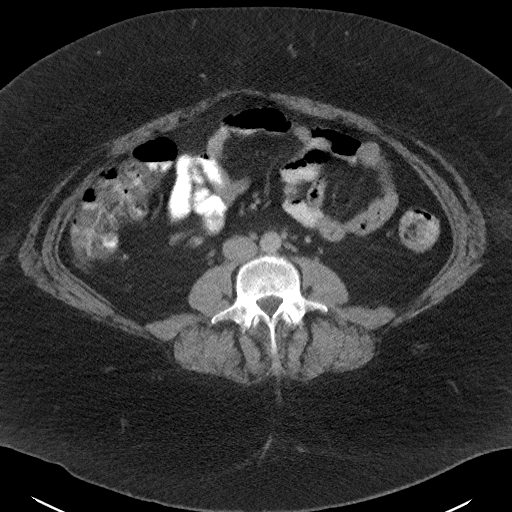
[im 61/105  soft-tissue]
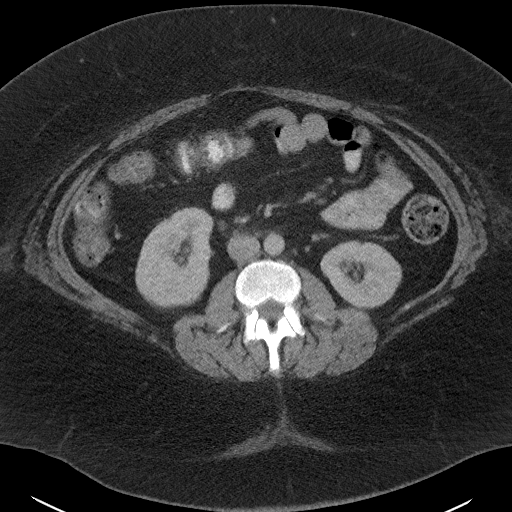
[im 70/105  soft-tissue]
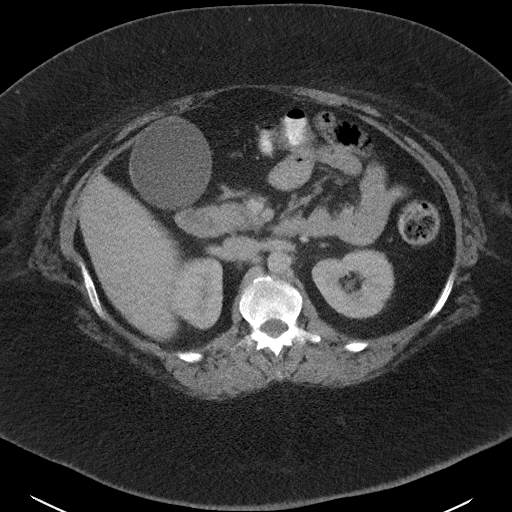
[im 70/105  bone]
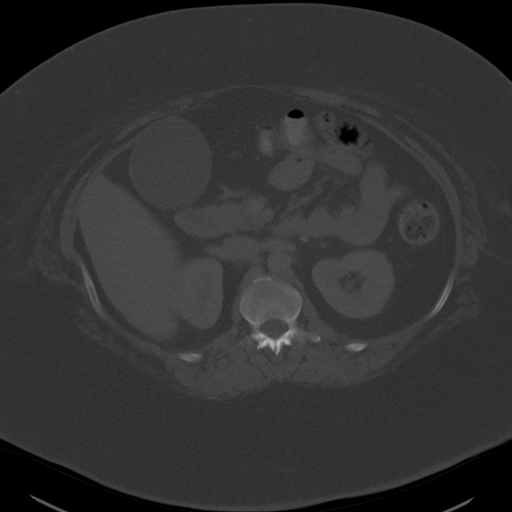
[im 74/105  soft-tissue]
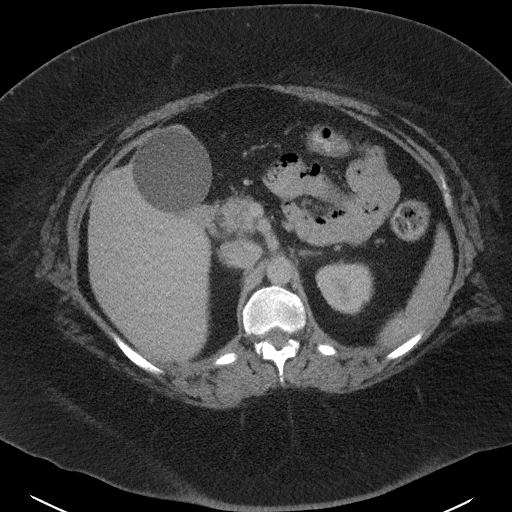
[im 83/105  soft-tissue]
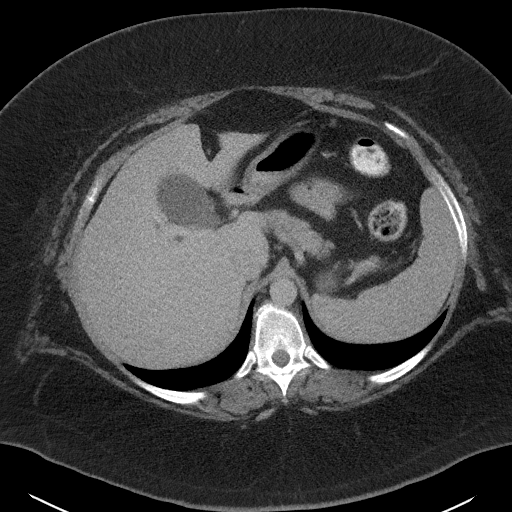
[im 92/105  soft-tissue]
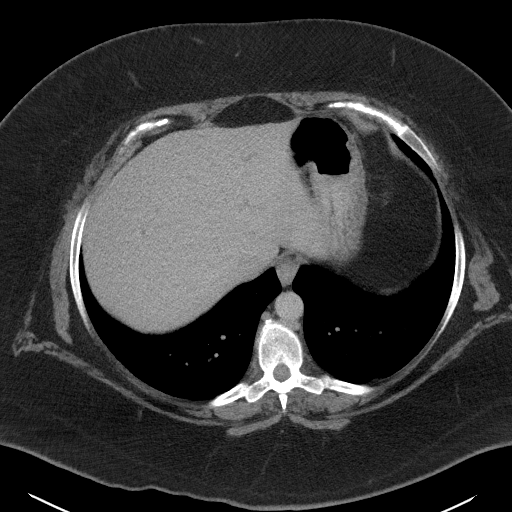
[im 100/105  soft-tissue]
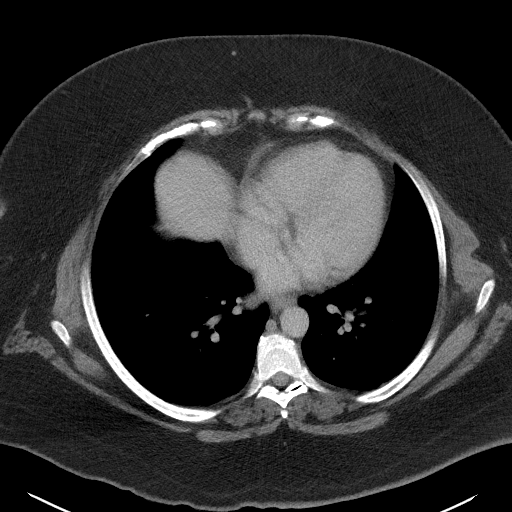

[Series 5: coronal st · coronal · 0.89mm/px · 3 of 125 slices shown]
[im 42/125  soft-tissue]
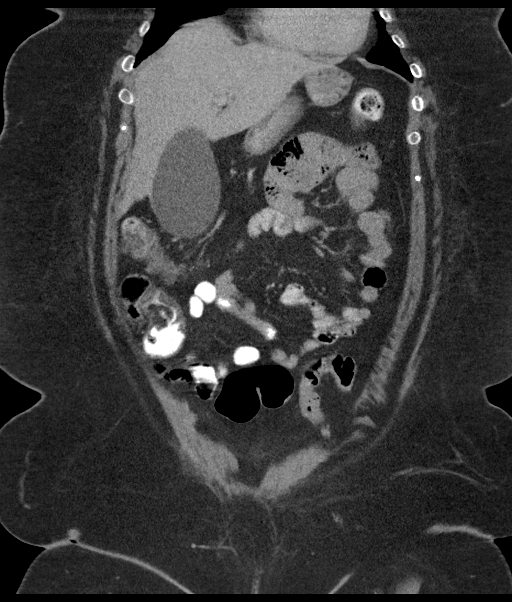
[im 56/125  soft-tissue]
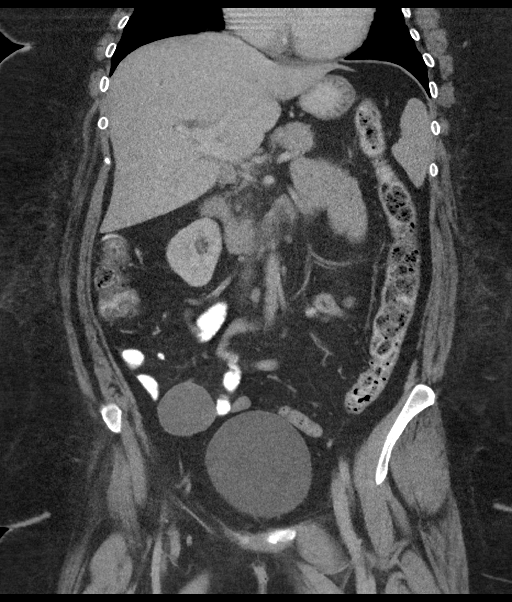
[im 69/125  soft-tissue]
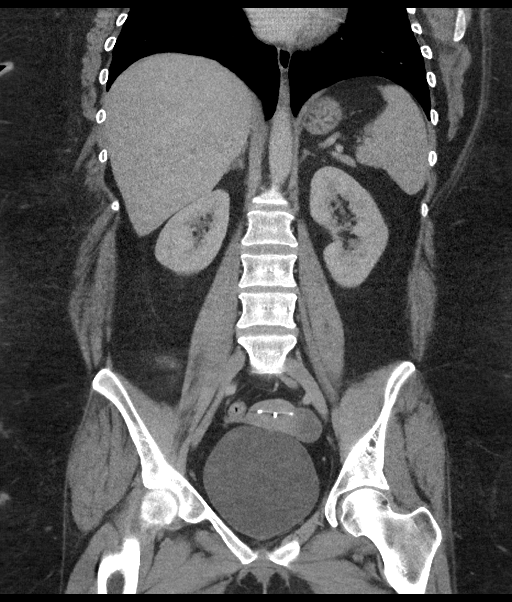

[16 of 46 positions shown; findings below may reference images not displayed]

FINDINGS: Lung bases are unremarkable.

Enhanced liver is unremarkable. Moderate distended gallbladder.
There is a noncalcified gallstone in gallbladder neck region
measures 1.8 cm. No pericholecystic fluid is noted.

CBD measures 7 mm in diameter.

Enhanced pancreas is unremarkable.

Enhanced spleen is unremarkable.  Small accessory splenule.

No adrenal gland mass. No hydronephrosis or hydroureter. No
calcified ureteral calculi. Mild distended urinary bladder. Next
item no small bowel obstruction. No thickened or dilated small bowel
loops. There is no pericecal inflammation. Normal appendix is noted
in axial image 61. The terminal ileum is unremarkable. No colitis or
diverticulitis.

No aortic aneurysm.  No retroperitoneal or mesenteric adenopathy.

IUD noted within anteflexed uterus. There is a right ovarian cyst
measures 5.2 cm. A left ovarian cyst measures 2.9 cm. No pelvic free
fluid is noted.

There is no inguinal adenopathy.
IMPRESSION: 1. There is moderate distension of the gallbladder. No
pericholecystic fluid is noted. Non calcified gallstone is noted in
gallbladder neck region measures 2.1 cm. Further correlation with
gallbladder ultrasound is recommended.
2. No hydronephrosis or hydroureter.
3. Normal appendix.  No pericecal inflammation.
4. No evidence of colitis or diverticulitis.
5. IUD with in place. Right ovarian cyst measures 5.2 cm. Left
ovarian cyst measures 2.9 cm.

## 2016-05-28 MED ORDER — HYDROCODONE-ACETAMINOPHEN 5-325 MG PO TABS: 1.0000 | ORAL_TABLET | Freq: Four times a day (QID) | ORAL | 0 refills | Status: DC | PRN

## 2016-05-28 MED ORDER — HYDROCODONE-ACETAMINOPHEN 5-325 MG PO TABS
1.0000 | ORAL_TABLET | ORAL | Status: DC | PRN
  Administered 2016-05-28 – 2016-06-02 (×15): 2 via ORAL
  Filled 2016-05-28 (×15): qty 2

## 2016-05-28 MED ORDER — ACETAMINOPHEN 325 MG PO TABS: 650.0000 mg | ORAL_TABLET | Freq: Four times a day (QID) | ORAL | Status: DC | PRN

## 2016-05-28 MED ORDER — HYDROMORPHONE HCL 1 MG/ML IJ SOLN
1.0000 mg | INTRAMUSCULAR | Status: DC | PRN
  Administered 2016-05-28 – 2016-06-03 (×26): 1 mg via INTRAVENOUS
  Filled 2016-05-28 (×26): qty 1

## 2016-05-28 MED ORDER — CIPROFLOXACIN IN D5W 400 MG/200ML IV SOLN
400.0000 mg | Freq: Two times a day (BID) | INTRAVENOUS | Status: DC
  Administered 2016-05-29 – 2016-05-31 (×5): 400 mg via INTRAVENOUS
  Filled 2016-05-28 (×7): qty 200

## 2016-05-28 MED ORDER — ONDANSETRON 4 MG PO TBDP
4.0000 mg | ORAL_TABLET | Freq: Four times a day (QID) | ORAL | Status: DC | PRN
Start: 2016-05-28 — End: 2016-05-29
  Filled 2016-05-28: qty 1

## 2016-05-28 MED ORDER — ENOXAPARIN SODIUM 40 MG/0.4ML ~~LOC~~ SOLN
40.0000 mg | Freq: Two times a day (BID) | SUBCUTANEOUS | Status: DC
  Administered 2016-05-29 – 2016-06-03 (×11): 40 mg via SUBCUTANEOUS
  Filled 2016-05-28 (×11): qty 0.4

## 2016-05-28 MED ORDER — DIATRIZOATE MEGLUMINE & SODIUM 66-10 % PO SOLN
15.0000 mL | Freq: Once | ORAL | Status: AC
  Administered 2016-05-28: 15 mL via ORAL

## 2016-05-28 MED ORDER — CIPROFLOXACIN HCL 500 MG PO TABS
500.0000 mg | ORAL_TABLET | Freq: Once | ORAL | Status: AC
  Administered 2016-05-28: 500 mg via ORAL
  Filled 2016-05-28: qty 1

## 2016-05-28 MED ORDER — ONDANSETRON HCL 4 MG/2ML IJ SOLN
4.0000 mg | Freq: Once | INTRAMUSCULAR | Status: AC
  Administered 2016-05-28: 4 mg via INTRAVENOUS
  Filled 2016-05-28: qty 2

## 2016-05-28 MED ORDER — MORPHINE SULFATE (PF) 4 MG/ML IV SOLN
4.0000 mg | Freq: Once | INTRAVENOUS | Status: AC
  Administered 2016-05-28: 4 mg via INTRAVENOUS
  Filled 2016-05-28: qty 1

## 2016-05-28 MED ORDER — SODIUM CHLORIDE 0.9 % IV BOLUS (SEPSIS)
1000.0000 mL | Freq: Once | INTRAVENOUS | Status: AC
  Administered 2016-05-28: 1000 mL via INTRAVENOUS

## 2016-05-28 MED ORDER — MORPHINE SULFATE (PF) 4 MG/ML IV SOLN
4.0000 mg | Freq: Once | INTRAVENOUS | Status: AC
  Administered 2016-05-28: 4 mg via INTRAVENOUS

## 2016-05-28 MED ORDER — PANTOPRAZOLE SODIUM 40 MG PO TBEC
40.0000 mg | DELAYED_RELEASE_TABLET | Freq: Every day | ORAL | Status: DC
  Administered 2016-05-28 – 2016-06-03 (×6): 40 mg via ORAL
  Filled 2016-05-28 (×6): qty 1

## 2016-05-28 MED ORDER — FLUOXETINE HCL 20 MG PO CAPS
20.0000 mg | ORAL_CAPSULE | ORAL | Status: DC
  Administered 2016-05-28 – 2016-06-03 (×6): 20 mg via ORAL
  Filled 2016-05-28 (×6): qty 1

## 2016-05-28 MED ORDER — ONDANSETRON HCL 4 MG/2ML IJ SOLN
4.0000 mg | Freq: Four times a day (QID) | INTRAMUSCULAR | Status: DC | PRN
  Administered 2016-05-29: 4 mg via INTRAVENOUS
  Filled 2016-05-28: qty 2

## 2016-05-28 MED ORDER — CIPROFLOXACIN HCL 500 MG PO TABS: 500.0000 mg | ORAL_TABLET | Freq: Two times a day (BID) | ORAL | 0 refills | Status: AC

## 2016-05-28 MED ORDER — ONDANSETRON HCL 4 MG PO TABS: 4.0000 mg | ORAL_TABLET | Freq: Three times a day (TID) | ORAL | 0 refills | Status: DC | PRN

## 2016-05-28 MED ORDER — ACETAMINOPHEN 650 MG RE SUPP: 650.0000 mg | Freq: Four times a day (QID) | RECTAL | Status: DC | PRN

## 2016-05-28 MED ORDER — ALPRAZOLAM 0.25 MG PO TABS
0.2500 mg | ORAL_TABLET | Freq: Two times a day (BID) | ORAL | Status: DC | PRN
Start: 2016-05-28 — End: 2016-06-03
  Administered 2016-05-28 – 2016-06-01 (×3): 0.25 mg via ORAL
  Filled 2016-05-28 (×3): qty 1

## 2016-05-28 MED ORDER — KCL IN DEXTROSE-NACL 20-5-0.45 MEQ/L-%-% IV SOLN
INTRAVENOUS | Status: DC
  Administered 2016-05-28 – 2016-05-29 (×2): via INTRAVENOUS
  Filled 2016-05-28 (×4): qty 1000

## 2016-05-28 MED ORDER — LEVOTHYROXINE SODIUM 50 MCG PO TABS
50.0000 ug | ORAL_TABLET | Freq: Every day | ORAL | Status: DC
  Administered 2016-05-28 – 2016-06-03 (×6): 50 ug via ORAL
  Filled 2016-05-28 (×6): qty 1

## 2016-05-28 MED ORDER — MORPHINE SULFATE (PF) 4 MG/ML IV SOLN
INTRAVENOUS | Status: AC
  Administered 2016-05-28: 4 mg via INTRAVENOUS
  Filled 2016-05-28: qty 1

## 2016-05-28 MED ORDER — IOPAMIDOL (ISOVUE-300) INJECTION 61%
100.0000 mL | Freq: Once | INTRAVENOUS | Status: AC | PRN
  Administered 2016-05-28: 100 mL via INTRAVENOUS

## 2016-05-28 NOTE — ED Notes (Signed)
Pt in ultrasound

## 2016-05-28 NOTE — Discharge Instructions (Signed)
You were evaluated for right-sided abdominal pain and found to have urinary tract infection, also cysts on both ovaries, right side greater than left side.  You're being treated for urinary tract infection with antibiotic called Cipro.  Follow-up with your primary care physician in about one week. You need to also follow-up with an OB/GYN physician in a few weeks for reevaluation of ovarian cysts.

## 2016-05-28 NOTE — H&P (Signed)
Taylor RaymondRebecca A Sennett is a 30 y.o. female with  abdominal pain.  HPI: She was in her usual state of good health until approximately 36 hours ago which developed some marked right lower quadrant abdominal pain. The pain persisted over the next 24 hours appeared to be constant without a colicky component. She was mildly nauseated but did not vomit. She was also mildly diaphoretic. 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  She denies any history of hepatitis, yellow jaundice, pancreatitis, peptic ulcer disease, gallbladder disease, or diverticulitis. She has no significant GYN history. She's 18 months out from the delivery of a healthy child through cesarean section. She does have IUD in place.  She does not have any history of abdominal surgery other than the C-section. She does not have any other symptoms with normal bowel function and urinary function recently.  Workup in the emergency room revealed normal liver function studies normal white blood cell count. Pelvic ultrasound and CT scan demonstrated 2 large ovarian cysts A5 centimeters cyst on the right and a 3 cm cyst on the left. However, the gallbladder was markedly distended with a large impacted cystic duct stone no evidence of acute cholecystitis. Surgical service was consulted.  Past Medical History:  Diagnosis Date  . H/O back injury    broken back age 30  . Hypertension   . Hypothyroidism    Past Surgical History:  Procedure Laterality Date  . CESAREAN SECTION N/A 11/22/2014   Procedure: CESAREAN SECTION;  Surgeon: Turner Danielsavid C Lowe, MD;  Location: [facility];  Service: Obstetrics;  Laterality: N/A;  . WISDOM TOOTH EXTRACTION     Social History   Social History  . Marital status: Divorced    Spouse name: N/A  . Number of children: N/A  . Years of education: N/A   Social History Main Topics  . Smoking status: Former Games developermoker  . Smokeless tobacco: Never Used   . Alcohol use No  . Drug use: No  . Sexual activity: Not Asked   Other Topics Concern  . None   Social History Narrative  . None     Review of Systems  Constitutional: Positive for chills and diaphoresis. Negative for fever.  HENT: Negative.   Eyes: Negative.   Respiratory: Negative for cough, shortness of breath and wheezing.   Cardiovascular: Positive for chest pain and palpitations.  Gastrointestinal: Positive for abdominal pain and nausea. Negative for heartburn and vomiting.  Genitourinary: Negative.   Musculoskeletal: Negative.   Skin: Negative.   Neurological: Negative.   Psychiatric/Behavioral: Negative.      PHYSICAL EXAM: BP 127/87 (BP Location: Left Arm)   Pulse 68   Temp 97.7 F (36.5 C) (Oral)   Resp 20   Ht 5\' 6"  (1.676 m)   Wt 136.1 kg (300 lb)   SpO2 98%   BMI 48.42 kg/m   Physical Exam  Constitutional: She is oriented to person, place, and time. No distress.  Morbidly obese  HENT:  Head: Normocephalic and atraumatic.  Eyes: EOM are normal. Pupils are equal, round, and reactive to light.  Neck: Normal range of motion. Neck supple.  Cardiovascular: Normal rate and regular rhythm.   Pulmonary/Chest: Effort normal and breath sounds normal. No respiratory distress.  Abdominal: Soft. Bowel sounds are normal. She exhibits no distension and no mass. There is tenderness. There is guarding. There is no rebound. No hernia.  Musculoskeletal: Normal range of motion.  She exhibits no edema or deformity.  Neurological: She is alert and oriented to person, place, and time.  Skin: Skin is warm and dry. She is not diaphoretic.  Psychiatric: She has a normal mood and affect. Her behavior is normal.   She has some mild right upper quadrant tenderness and some mild right lower quadrant tenderness. I cannot palpate a mass. She has no rebound but some mild guarding in both upper and lower quadrant. She does have active bowel sounds.  Impression/Plan: She is no  elevation her white blood cell count and no change in liver function studies. Her clinical presentation suggests ovarian cystic disease and perhaps stretching the capsule of the cyst. There is no free fluid. However, I have independently reviewed her imaging and she does have a markedly distended gallbladder with an impacted cystic duct stone. I'm not certain which of these 2 problems is causing her evaluation in the emergency room. My initial impression is that this woman likely has symptomatic biliary tract disease. We will admit her to the hospital place her on IV antibiotics pain medicine and asked U Rolanda see her with regard to her cysts. I discussed this plan with her in detail and she is in agreement. Her family was present for the interview   Tiney Rougealph Ely III, MD  05/28/2016, 4:59 PM

## 2016-05-28 NOTE — ED Provider Notes (Signed)
[facility] Emergency Department Provider Note ____________________________________________   I have reviewed the triage vital signs and the triage nursing note.  HISTORY  Chief Complaint Abdominal Pain   Historian Patient  HPI Taylor [name] is a 30 y.o. female with obesity and mirena IUD here for gradual onset rlq pain since this weekend (Sunday?) so ongoing now 3 days.  Slight worsening over time, constant with some waxing and waning.  Movement makes it a little worse and working overnight she has had some pain in the right groin.  Denies dysuria. Denies hematuria. Denies fever, but states she's had some chills. Decreased appetite. No mild nausea with no vomiting. No diarrhea. No constipation, reports normal bowel movements. No flank pain.  States that she has brownish discharge once a month with no melena, no concerns there. No additional problem with vaginal discharge or vaginal bleeding.  No prior abdominal surgical history. No prior episode of pain like this. No prior history of ovarian cysts, but states that she knows that is a possible side effect of the Mirena.  Currently she rates her pain at 8 out of 10.    Past Medical History:  Diagnosis Date  . H/O back injury    broken back age 30  . Hypertension   . Hypothyroidism     Patient Active Problem List   Diagnosis Date Noted  . Normal pregnancy 11/21/2014  . Echogenic intracardiac focus of fetus on prenatal ultrasound   . Obesity complicating pregnancy   . Pre-existing essential hypertension complicating pregnancy in third trimester   . [redacted] weeks gestation of pregnancy   . [redacted] weeks gestation of pregnancy   . Chronic hypertension complicating or reason for care during pregnancy   . Obesity in pregnancy, antepartum   . [redacted] weeks gestation of pregnancy     Past Surgical History:  Procedure Laterality Date  . CESAREAN SECTION N/A 11/22/2014   Procedure: CESAREAN SECTION;  Surgeon: Turner Danielsavid C  Lowe, MD;  Location: [facility];  Service: Obstetrics;  Laterality: N/A;  . WISDOM TOOTH EXTRACTION      Prior to Admission medications   Medication Sig Start Date End Date Taking? Authorizing Provider  FLUoxetine (PROZAC) 20 MG capsule Take 1 capsule by mouth every morning. 03/20/16  Yes Historical Provider, MD  ibuprofen (ADVIL,MOTRIN) 800 MG tablet Take 800 mg by mouth every 8 (eight) hours as needed for moderate pain.   Yes Historical Provider, MD  levothyroxine (SYNTHROID, LEVOTHROID) 50 MCG tablet Take 1 tablet by mouth daily. 03/20/16  Yes Historical Provider, MD  ciprofloxacin (CIPRO) 500 MG tablet Take 1 tablet (500 mg total) by mouth 2 (two) times daily. 05/28/16 06/04/16  Governor Rooksebecca Ameli Sangiovanni, MD    Allergies  Allergen Reactions  . Keflex [Cephalexin] Anaphylaxis    Family History  Problem Relation Age of Onset  . Hypertension Mother   . Hypertension Father   . Heart disease Maternal Grandmother   . Hypertension Maternal Grandmother   . Stroke Maternal Grandmother   . Cancer Maternal Grandmother     breast colon  . Hypertension Maternal Grandfather   . Diabetes Maternal Grandfather   . Hypertension Paternal Grandmother   . Cancer Paternal Grandmother     kidney  . Hypertension Paternal Grandfather     Social History Social History  Substance Use Topics  . Smoking status: Former Games developermoker  . Smokeless tobacco: Never Used  . Alcohol use No    Review of Systems  Constitutional: Negative for fever. Eyes:  Negative for visual changes. ENT: Negative for sore throat. Cardiovascular: Negative for chest pain. Respiratory: Negative for shortness of breath. Gastrointestinal: As per history of present illness. No black or bloody stools.. Genitourinary: Negative for dysuria. Musculoskeletal: Negative for back pain. Skin: Negative for rash. Neurological: Negative for headache. 10 point Review of Systems otherwise negative ____________________________________________   PHYSICAL  EXAM:  VITAL SIGNS: ED Triage Vitals  Enc Vitals Group     BP 05/28/16 0700 (!) 167/99     Pulse Rate 05/28/16 0700 97     Resp 05/28/16 0700 18     Temp 05/28/16 0700 97.7 F (36.5 C)     Temp Source 05/28/16 0700 Oral     SpO2 05/28/16 0700 98 %     Weight 05/28/16 0700 300 lb (136.1 kg)     Height 05/28/16 0700 5\' 6"  (1.676 m)     Head Circumference --      Peak Flow --      Pain Score 05/28/16 0659 8     Pain Loc --      Pain Edu? --      Excl. in GC? --      Constitutional: Alert and oriented. Well appearing and in no distress. HEENT   Head: Normocephalic and atraumatic.      Eyes: Conjunctivae are normal. PERRL. Normal extraocular movements.      Ears:         Nose: No congestion/rhinnorhea.   Mouth/Throat: Mucous membranes are moist.   Neck: No stridor. Cardiovascular/Chest: Normal rate, regular rhythm.  No murmurs, rubs, or gallops. Respiratory: Normal respiratory effort without tachypnea nor retractions. Breath sounds are clear and equal bilaterally. No wheezes/rales/rhonchi. Gastrointestinal: Soft. No distention, no guarding, no rebound.  Morbid obesity. Tenderness in the right lower quadrant, moderate. Some tenderness in the right groin without any evidence of abscess, vascular abnormality, or hernia mass.  Genitourinary/rectal:Deferred Musculoskeletal: Nontender with normal range of motion in all extremities. No joint effusions.  No lower extremity tenderness.  No edema. Neurologic:  Normal speech and language. No gross or focal neurologic deficits are appreciated. Skin:  Skin is warm, dry and intact. No rash noted. Psychiatric: Mood and affect are normal. Speech and behavior are normal. Patient exhibits appropriate insight and judgment.  ____________________________________________   EKG I, Governor Rooksebecca Camren Henthorn, MD, the attending physician have personally viewed and interpreted all ECGs.  None ____________________________________________  LABS (pertinent  positives/negatives)  Labs Reviewed  COMPREHENSIVE METABOLIC PANEL - Abnormal; Notable for the following:       Result Value   Calcium 8.7 (*)    All other components within normal limits  CBC WITH DIFFERENTIAL/PLATELET - Abnormal; Notable for the following:    RDW 15.1 (*)    All other components within normal limits  URINALYSIS COMPLETEWITH MICROSCOPIC ([facility] ONLY) - Abnormal; Notable for the following:    Color, Urine YELLOW (*)    APPearance CLOUDY (*)    Hgb urine dipstick 2+ (*)    Nitrite POSITIVE (*)    Leukocytes, UA TRACE (*)    Bacteria, UA MANY (*)    Squamous Epithelial / LPF 6-30 (*)    All other components within normal limits  URINE CULTURE  POC URINE PREG, ED  POCT PREGNANCY, URINE    ____________________________________________  RADIOLOGY All Xrays were viewed by me. Imaging interpreted by Radiologist.   Ultrasound transvaginal and pelvic: IMPRESSION: IUD within uterus.  Large RIGHT ovarian cyst 5.2 cm diameter with smaller LEFT ovarian follicle cyst 2.8  cm diameter.  No evidence of ovarian torsion or additional adnexal mass.   CT abdomen and pelvis with contrast:  IMPRESSION: 1. There is moderate distension of the gallbladder. No pericholecystic fluid is noted. Non calcified gallstone is noted in gallbladder neck region measures 2.1 cm. Further correlation with gallbladder ultrasound is recommended. 2. No hydronephrosis or hydroureter. 3. Normal appendix. No pericecal inflammation. 4. No evidence of colitis or diverticulitis. 5. IUD with in place. Right ovarian cyst measures 5.2 cm. Left ovarian cyst measures 2.9 cm.  [place] ruq:  Pending __________________________________________  PROCEDURES  Procedure(s) performed: None  Critical Care performed: None  ____________________________________________   ED COURSE / ASSESSMENT AND PLAN  Pertinent labs & imaging results that were available during my care of the patient were reviewed by me  and considered in my medical decision making (see chart for details).   Admitting pain for about 3 days now in the right lower quadrant, sounds less likely kidney stone, UTI, but also consider ovarian source, or appy.  Denies pelvic discharge and declines pelvic exam.  Urinalysis consistent with urinary tract infection.  Her CT shows a right-sided ovarian cyst greater than left side. We discussed obtaining CT to rule out appendicitis given right-sided pain, and I don't think that the urinary tract infection without elevated white blood cell count is likely causing her as much pain as she is having, and if it is ovarian cyst is giving her the pain, I think she is likely to have persistent pain, thereby making it difficult to tell whether or not she needs to return if you days for repeat symptoms.  CT scan shows no appendicitis, but there is evidence of possible gallstone stuck in the neck. This was discussed with the patient, and ultrasound however was ordered as well.  On exam abdominal pain seems more lower than upper, and LFTs are not consistent with acute biliary obstruction, and she is not febrile and has no elevated white blood cell count and so I think acute cholecystitis is much less likely.  Right upper quadrant ultrasound is pending. Patient care transferred to Dr. Huel Cote at shift change 3:30 PM.  It is no emergency findings to change the disposition, patient may be discharged with my prepared discharge instructions.   CONSULTATIONS:   None   Patient / Family / Caregiver informed of clinical course, medical decision-making process, and agree with plan.   I discussed return precautions, follow-up instructions, and discharged instructions with patient and/or family.   ___________________________________________   FINAL CLINICAL IMPRESSION(S) / ED DIAGNOSES   Final diagnoses:  Abdominal pain  UTI (lower urinary tract infection)  Cysts of both ovaries  Gallstones               Note: This dictation was prepared with Dragon dictation. Any transcriptional errors that result from this process are unintentional    Governor Rooks, MD 06/02/16 1204

## 2016-05-28 NOTE — ED Triage Notes (Signed)
Patient ambulatory to triage with steady gait, without difficulty or distress noted; pt reports right lower abd pain since weekend radiating around right leg accomp by nausea

## 2016-05-28 NOTE — ED Notes (Signed)
Report called by Clinton SawyerKailey; floor will not take pt till 1915

## 2016-05-28 NOTE — Progress Notes (Signed)
Anticoagulation monitoring(Lovenox):  30 yo  F ordered Lovenox 40 mg Q24h  Filed Weights   05/28/16 0700  Weight: 300 lb (136.1 kg)   BMI 48.5   Lab Results  Component Value Date   CREATININE 0.66 05/28/2016   CREATININE 0.53 (L) 08/25/2014   CREATININE 0.51 (L) 07/21/2014   Estimated Creatinine Clearance: 146.1 mL/min (by C-G formula based on SCr of 0.8 mg/dL). Hemoglobin & Hematocrit     Component Value Date/Time   HGB 13.1 05/28/2016 0744   HGB 11.0 (L) 08/25/2014 1300   HCT 38.8 05/28/2016 0744   HCT 33.0 (L) 08/25/2014 1300     Per Protocol for Patient with estCrcl > 30 ml/min and BMI > 40, will transition to Lovenox 40 mg Q12h.

## 2016-05-28 NOTE — ED Notes (Signed)
Pt reports that her pain was better until the ultrasound. Now reports 8/10 pain

## 2016-05-29 ENCOUNTER — Encounter
Admission: EM | Disposition: A | Discharge status: Home / Self Care | Payer: Self-pay | Source: Home / Self Care | Attending: Surgery

## 2016-05-29 ENCOUNTER — Observation Stay: Payer: 59 | Admitting: Anesthesiology

## 2016-05-29 ENCOUNTER — Observation Stay: Payer: 59

## 2016-05-29 ENCOUNTER — Encounter: Payer: Self-pay | Admitting: *Deleted

## 2016-05-29 DIAGNOSIS — K839 Disease of biliary tract, unspecified: Secondary | ICD-10-CM | POA: Diagnosis not present

## 2016-05-29 DIAGNOSIS — R109 Unspecified abdominal pain: Secondary | ICD-10-CM | POA: Diagnosis not present

## 2016-05-29 DIAGNOSIS — K8010 Calculus of gallbladder with chronic cholecystitis without obstruction: Secondary | ICD-10-CM | POA: Diagnosis not present

## 2016-05-29 DIAGNOSIS — K8020 Calculus of gallbladder without cholecystitis without obstruction: Secondary | ICD-10-CM | POA: Diagnosis not present

## 2016-05-29 DIAGNOSIS — R1011 Right upper quadrant pain: Secondary | ICD-10-CM | POA: Diagnosis not present

## 2016-05-29 HISTORY — PX: CHOLECYSTECTOMY: SHX55

## 2016-05-29 LAB — URINE CULTURE

## 2016-05-29 LAB — COMPREHENSIVE METABOLIC PANEL
ALK PHOS: 67 U/L (ref 38–126)
ALT: 15 U/L (ref 14–54)
ANION GAP: 4 — AB (ref 5–15)
AST: 16 U/L (ref 15–41)
Albumin: 3.3 g/dL — ABNORMAL LOW (ref 3.5–5.0)
BILIRUBIN TOTAL: 0.4 mg/dL (ref 0.3–1.2)
BUN: 9 mg/dL (ref 6–20)
CALCIUM: 8.2 mg/dL — AB (ref 8.9–10.3)
CO2: 28 mmol/L (ref 22–32)
Chloride: 104 mmol/L (ref 101–111)
Creatinine, Ser: 0.68 mg/dL (ref 0.44–1.00)
GFR calc non Af Amer: >60 mL/min (ref >60)
Glucose, Bld: 120 mg/dL — ABNORMAL HIGH (ref 65–99)
Potassium: 3.9 mmol/L (ref 3.5–5.1)
Sodium: 136 mmol/L (ref 135–145)
TOTAL PROTEIN: 6.9 g/dL (ref 6.5–8.1)

## 2016-05-29 LAB — CBC
HEMATOCRIT: 37.8 % (ref 35.0–47.0)
HEMOGLOBIN: 12.3 g/dL (ref 12.0–16.0)
MCH: 27.8 pg (ref 26.0–34.0)
MCHC: 32.4 g/dL (ref 32.0–36.0)
MCV: 85.8 fL (ref 80.0–100.0)
Platelets: 291 10*3/uL (ref 150–440)
RBC: 4.41 MIL/uL (ref 3.80–5.20)
RDW: 15.3 % — ABNORMAL HIGH (ref 11.5–14.5)
WBC: 7.9 10*3/uL (ref 3.6–11.0)

## 2016-05-29 IMAGING — NM NM HEPATOBILIARY IMAGE, INC GB
1 series · 6 of 6 positions shown · non-contrast
Comparison: Ultrasound and CT studies on [DATE]

CLINICAL DATA: Right-sided abdominal pain, nausea and vomiting.
Evidence of cholelithiasis and gallbladder distension by ultrasound
and potentially impacted calculus in the gallbladder neck by CT.

EXAM:
NUCLEAR MEDICINE HEPATOBILIARY IMAGING
TECHNIQUE: Sequential images of the abdomen were obtained [DATE] minutes
following intravenous administration of radiopharmaceutical.
RADIOPHARMACEUTICALS:  5.4 mCi [CX]  Choletec IV

[Series 1000: hepatobiliary scan · 9.59mm/px · 6 of 60 frames shown]
[frame 6/60]
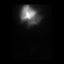
[frame 16/60]
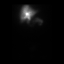
[frame 26/60]
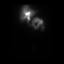
[frame 36/60]
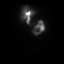
[frame 46/60]
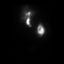
[frame 56/60]
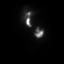

[6 of 6 positions shown; findings below may reference images not displayed]

FINDINGS: Prompt uptake and biliary excretion of activity by the liver is
seen. Biliary activity passes into small bowel, consistent with a
patent common bile duct. There is nonvisualization of the
gallbladder over 1 hour of imaging consistent with cystic duct
obstruction/cholecystitis.
IMPRESSION: Findings by hepatobiliary nuclear medicine imaging consistent with
cystic duct obstruction/cholecystitis.

These results were called by telephone at the time of interpretation
on [DATE] at [DATE] to Dr. JOYE , who verbally
acknowledged these results.

## 2016-05-29 SURGERY — LAPAROSCOPIC CHOLECYSTECTOMY
Anesthesia: General | Wound class: Clean Contaminated

## 2016-05-29 MED ORDER — HYDROMORPHONE HCL 1 MG/ML IJ SOLN
INTRAMUSCULAR | Status: AC
  Administered 2016-05-29: 19:00:00
  Filled 2016-05-29: qty 1

## 2016-05-29 MED ORDER — PHENYLEPHRINE HCL 10 MG/ML IJ SOLN
INTRAMUSCULAR | Status: DC | PRN
  Administered 2016-05-29: 100 ug via INTRAVENOUS

## 2016-05-29 MED ORDER — ROCURONIUM BROMIDE 100 MG/10ML IV SOLN
INTRAVENOUS | Status: DC | PRN
  Administered 2016-05-29: 45 mg via INTRAVENOUS
  Administered 2016-05-29: 10 mg via INTRAVENOUS
  Administered 2016-05-29: 30 mg via INTRAVENOUS
  Administered 2016-05-29: 5 mg via INTRAVENOUS
  Administered 2016-05-29: 10 mg via INTRAVENOUS

## 2016-05-29 MED ORDER — MIDAZOLAM HCL 2 MG/2ML IJ SOLN
INTRAMUSCULAR | Status: DC | PRN
  Administered 2016-05-29: 2 mg via INTRAVENOUS

## 2016-05-29 MED ORDER — BUPIVACAINE HCL (PF) 0.25 % IJ SOLN
INTRAMUSCULAR | Status: DC | PRN
  Administered 2016-05-29: 18 mL

## 2016-05-29 MED ORDER — IOTHALAMATE MEGLUMINE 60 % INJ SOLN
INTRAMUSCULAR | Status: DC | PRN
  Administered 2016-05-29: 16:00:00

## 2016-05-29 MED ORDER — DEXAMETHASONE SODIUM PHOSPHATE 10 MG/ML IJ SOLN
INTRAMUSCULAR | Status: DC | PRN
  Administered 2016-05-29: 10 mg via INTRAVENOUS

## 2016-05-29 MED ORDER — MEPERIDINE HCL 25 MG/ML IJ SOLN: 6.2500 mg | INTRAMUSCULAR | Status: DC | PRN

## 2016-05-29 MED ORDER — SUCCINYLCHOLINE CHLORIDE 20 MG/ML IJ SOLN
INTRAMUSCULAR | Status: DC | PRN
  Administered 2016-05-29: 100 mg via INTRAVENOUS

## 2016-05-29 MED ORDER — SUGAMMADEX SODIUM 200 MG/2ML IV SOLN
INTRAVENOUS | Status: DC | PRN
  Administered 2016-05-29: 325 mg via INTRAVENOUS

## 2016-05-29 MED ORDER — PROMETHAZINE HCL 25 MG/ML IJ SOLN
25.0000 mg | Freq: Four times a day (QID) | INTRAMUSCULAR | Status: DC | PRN
  Administered 2016-05-29: 25 mg via INTRAVENOUS
  Filled 2016-05-29: qty 1

## 2016-05-29 MED ORDER — FENTANYL CITRATE (PF) 100 MCG/2ML IJ SOLN
INTRAMUSCULAR | Status: AC
  Administered 2016-05-29: 21:00:00
  Filled 2016-05-29: qty 2

## 2016-05-29 MED ORDER — FENTANYL CITRATE (PF) 100 MCG/2ML IJ SOLN
INTRAMUSCULAR | Status: AC
  Administered 2016-05-29: 19:00:00
  Filled 2016-05-29: qty 2

## 2016-05-29 MED ORDER — SODIUM CHLORIDE 0.9 % IV SOLN
INTRAVENOUS | Status: DC | PRN
  Administered 2016-05-29 (×2): via INTRAVENOUS

## 2016-05-29 MED ORDER — HYDROMORPHONE HCL 1 MG/ML IJ SOLN
1.0000 mg | Freq: Once | INTRAMUSCULAR | Status: AC
  Administered 2016-05-29: 1 mg via INTRAVENOUS

## 2016-05-29 MED ORDER — EPHEDRINE SULFATE 50 MG/ML IJ SOLN
INTRAMUSCULAR | Status: DC | PRN
  Administered 2016-05-29 (×2): 10 mg via INTRAVENOUS
  Administered 2016-05-29: 5 mg via INTRAVENOUS

## 2016-05-29 MED ORDER — PROMETHAZINE HCL 25 MG/ML IJ SOLN: 6.2500 mg | INTRAMUSCULAR | Status: DC | PRN

## 2016-05-29 MED ORDER — SODIUM CHLORIDE 0.9 % IJ SOLN
INTRAMUSCULAR | Status: AC
  Filled 2016-05-29: qty 50

## 2016-05-29 MED ORDER — TECHNETIUM TC 99M MEBROFENIN IV KIT
5.3900 | PACK | Freq: Once | INTRAVENOUS | Status: AC | PRN
  Administered 2016-05-29: 08:00:00 5.39 via INTRAVENOUS

## 2016-05-29 MED ORDER — FENTANYL CITRATE (PF) 100 MCG/2ML IJ SOLN
25.0000 ug | INTRAMUSCULAR | Status: DC | PRN
  Administered 2016-05-29 (×3): 50 ug via INTRAVENOUS

## 2016-05-29 MED ORDER — PROPOFOL 10 MG/ML IV BOLUS
INTRAVENOUS | Status: DC | PRN
  Administered 2016-05-29: 170 mg via INTRAVENOUS

## 2016-05-29 MED ORDER — KCL IN DEXTROSE-NACL 20-5-0.45 MEQ/L-%-% IV SOLN
INTRAVENOUS | Status: DC
Start: 2016-05-29 — End: 2016-05-31
  Administered 2016-05-29 – 2016-05-30 (×3): via INTRAVENOUS
  Filled 2016-05-29 (×7): qty 1000

## 2016-05-29 MED ORDER — HYDROMORPHONE HCL 1 MG/ML IJ SOLN
0.5000 mg | INTRAMUSCULAR | Status: AC | PRN
  Administered 2016-05-29 (×4): 0.5 mg via INTRAVENOUS

## 2016-05-29 MED ORDER — ONDANSETRON HCL 4 MG/2ML IJ SOLN
INTRAMUSCULAR | Status: DC | PRN
  Administered 2016-05-29: 4 mg via INTRAVENOUS

## 2016-05-29 MED ORDER — OXYCODONE HCL 5 MG PO TABS: 5.0000 mg | ORAL_TABLET | Freq: Once | ORAL | Status: DC | PRN

## 2016-05-29 MED ORDER — FENTANYL CITRATE (PF) 100 MCG/2ML IJ SOLN
INTRAMUSCULAR | Status: DC | PRN
  Administered 2016-05-29 (×4): 50 ug via INTRAVENOUS

## 2016-05-29 MED ORDER — OXYCODONE HCL 5 MG/5ML PO SOLN: 5.0000 mg | Freq: Once | ORAL | Status: DC | PRN

## 2016-05-29 MED ORDER — LIDOCAINE HCL (CARDIAC) 20 MG/ML IV SOLN
INTRAVENOUS | Status: DC | PRN
  Administered 2016-05-29: 40 mg via INTRAVENOUS

## 2016-05-29 SURGICAL SUPPLY — 55 items
APPLIER CLIP ROT 10 11.4 M/L (STAPLE) ×3
APR CLP MED LRG 11.4X10 (STAPLE) ×1
BAG COUNTER SPONGE EZ (MISCELLANEOUS) ×1 IMPLANT
BAG SPNG 4X4 CLR HAZ (MISCELLANEOUS)
CANISTER SUCT 1200ML W/VALVE (MISCELLANEOUS) ×3 IMPLANT
CATH REDDICK CHOLANGI 4FR 50CM (CATHETERS) ×1 IMPLANT
CHLORAPREP W/TINT 26ML (MISCELLANEOUS) ×3 IMPLANT
CLIP APPLIE ROT 10 11.4 M/L (STAPLE) ×1 IMPLANT
CONRAY 60ML FOR OR (MISCELLANEOUS) ×1 IMPLANT
COUNTER SPONGE BAG EZ (MISCELLANEOUS)
DEVICE PMI PUNCTURE CLOSURE (MISCELLANEOUS) ×2 IMPLANT
DRAIN CHANNEL JP 19F (MISCELLANEOUS) ×4 IMPLANT
DRAPE SHEET LG 3/4 BI-LAMINATE (DRAPES) ×3 IMPLANT
DRSG TEGADERM 2-3/8X2-3/4 SM (GAUZE/BANDAGES/DRESSINGS) ×4 IMPLANT
DRSG TELFA 3X8 NADH (GAUZE/BANDAGES/DRESSINGS) ×3 IMPLANT
ELECT REM PT RETURN 9FT ADLT (ELECTROSURGICAL) ×3
ELECTRODE REM PT RTRN 9FT ADLT (ELECTROSURGICAL) ×1 IMPLANT
GLOVE BIO SURGEON STRL SZ 6.5 (GLOVE) ×2 IMPLANT
GLOVE BIO SURGEON STRL SZ7.5 (GLOVE) ×7 IMPLANT
GLOVE BIO SURGEONS STRL SZ 6.5 (GLOVE) ×2
GLOVE INDICATOR 8.0 STRL GRN (GLOVE) ×3 IMPLANT
GOWN STRL REUS W/ TWL LRG LVL3 (GOWN DISPOSABLE) ×2 IMPLANT
GOWN STRL REUS W/TWL LRG LVL3 (GOWN DISPOSABLE) ×12
GRASPER SUT TROCAR 14GX15 (MISCELLANEOUS) ×3 IMPLANT
IRRIGATION STRYKERFLOW (MISCELLANEOUS) ×1 IMPLANT
IRRIGATOR STRYKERFLOW (MISCELLANEOUS) ×3
IV NS 1000ML (IV SOLUTION) ×3
IV NS 1000ML BAXH (IV SOLUTION) ×1 IMPLANT
LABEL OR SOLS (LABEL) ×3 IMPLANT
NDL HYPO 25X1 1.5 SAFETY (NEEDLE) ×1 IMPLANT
NDL INSUFFLATION 14GA 120MM (NEEDLE) ×1 IMPLANT
NDL SAFETY 18GX1.5 (NEEDLE) ×3 IMPLANT
NEEDLE HYPO 25X1 1.5 SAFETY (NEEDLE) ×3 IMPLANT
NEEDLE INSUFFLATION 14GA 120MM (NEEDLE) ×3 IMPLANT
NS IRRIG 500ML POUR BTL (IV SOLUTION) ×3 IMPLANT
PACK LAP CHOLECYSTECTOMY (MISCELLANEOUS) ×3 IMPLANT
PAD DRESSING TELFA 3X8 NADH (GAUZE/BANDAGES/DRESSINGS) ×1 IMPLANT
PENCIL ELECTRO HAND CTR (MISCELLANEOUS) ×2 IMPLANT
POUCH ENDO CATCH 10MM SPEC (MISCELLANEOUS) ×5 IMPLANT
SCISSORS METZENBAUM CVD 33 (INSTRUMENTS) ×3 IMPLANT
SEAL FOR SCOPE WARMER C3101 (MISCELLANEOUS) ×1 IMPLANT
SLEEVE ADV FIXATION 5X100MM (TROCAR) ×3 IMPLANT
SUT ETHILON 3-0 FS-10 30 BLK (SUTURE) ×3
SUT ETHILON 5-0 FS-2 18 BLK (SUTURE) ×3 IMPLANT
SUT VIC AB 0 CT2 27 (SUTURE) ×3 IMPLANT
SUTURE EHLN 3-0 FS-10 30 BLK (SUTURE) IMPLANT
SYR 3ML LL SCALE MARK (SYRINGE) ×3 IMPLANT
SYRINGE 10CC LL (SYRINGE) ×2 IMPLANT
TROCAR 12M 150ML BLUNT (TROCAR) ×2 IMPLANT
TROCAR 5M 150ML BLDLS (TROCAR) ×4 IMPLANT
TROCAR Z-THREAD FIOS 11X100 BL (TROCAR) ×3 IMPLANT
TROCAR Z-THREAD OPTICAL 5X100M (TROCAR) ×3 IMPLANT
TROCAR Z-THREAD SLEEVE 11X100 (TROCAR) ×3 IMPLANT
TUBING INSUFFLATOR HI FLOW (MISCELLANEOUS) ×3 IMPLANT
WATER STERILE IRR 1000ML POUR (IV SOLUTION) ×3 IMPLANT

## 2016-05-29 NOTE — Progress Notes (Signed)
Subjective:   She is still very nauseated and had increase in her right upper quadrant pain. She did vomit over the course of the evening. She's not had any fever. Her white blood cell count is remaining the same and liver function studies are not elevated.  Vital signs in last 24 hours: Temp:  [97.5 F (36.4 C)-98.2 F (36.8 C)] 97.6 F (36.4 C) (08/16 0518) Pulse Rate:  [57-76] 57 (08/16 0518) Resp:  [17-20] 17 (08/16 0518) BP: (101-137)/(62-93) 101/62 (08/16 0518) SpO2:  [96 %-100 %] 96 % (08/16 0518) Weight:  [155.3 kg (342 lb 4.8 oz)] 155.3 kg (342 lb 4.8 oz) (08/15 1937)    Intake/Output from previous day: 08/15 0701 - 08/16 0700 In: 1000 [P.O.:360; I.V.:640] Out: -   Exam:  Her abdomen demonstrates more right upper quadrant right flank tenderness then in the emergency department yesterday. She does not have any right lower quadrant tenderness at the present time. She's breathing easily with no respiratory distress and she has no wheezing or shortness of breath.  Lab Results:  CBC  Recent Labs  05/28/16 0744 05/29/16 0432  WBC 7.9 7.9  HGB 13.1 12.3  HCT 38.8 37.8  PLT 312 291   CMP     Component Value Date/Time   NA 136 05/29/2016 0432   NA 140 08/25/2014 1300   K 3.9 05/29/2016 0432   K 3.6 08/25/2014 1300   CL 104 05/29/2016 0432   CL 111 (H) 08/25/2014 1300   CO2 28 05/29/2016 0432   CO2 22 08/25/2014 1300   GLUCOSE 120 (H) 05/29/2016 0432   GLUCOSE 87 08/25/2014 1300   BUN 9 05/29/2016 0432   BUN 5 (L) 08/25/2014 1300   CREATININE 0.68 05/29/2016 0432   CREATININE 0.53 (L) 08/25/2014 1300   CALCIUM 8.2 (L) 05/29/2016 0432   CALCIUM 7.9 (L) 08/25/2014 1300   PROT 6.9 05/29/2016 0432   PROT 6.3 (L) 08/25/2014 1300   ALBUMIN 3.3 (L) 05/29/2016 0432   ALBUMIN 2.4 (L) 08/25/2014 1300   AST 16 05/29/2016 0432   AST 8 (L) 08/25/2014 1300   ALT 15 05/29/2016 0432   ALT 13 (L) 08/25/2014 1300   ALKPHOS 67 05/29/2016 0432   ALKPHOS 77 08/25/2014 1300    BILITOT 0.4 05/29/2016 0432   BILITOT 0.2 08/25/2014 1300   GFRNONAA >60 05/29/2016 0432   GFRNONAA >60 08/25/2014 1300   GFRNONAA >60 12/11/2012 0623   GFRAA >60 05/29/2016 0432   GFRAA >60 08/25/2014 1300   GFRAA >60 12/11/2012 0623   PT/INR No results for input(s): LABPROT, INR in the last 72 hours.  Studies/Results: US Transvaginal Non-ob  Result Date: 05/28/2016 CLINICAL DATA:  RIGHT lower quadrant pain, nausea for 4 days, negative pregnancy test, IUD EXAM: TRANSABDOMINAL AND TRANSVAGINAL ULTRASOUND OF PELVIS DOPPLER ULTRASOUND OF OVARIES TECHNIQUE: Both transabdominal and transvaginal ultrasound examinations of the pelvis were performed. Transabdominal technique was performed for global imaging of the pelvis including uterus, ovaries, adnexal regions, and pelvic cul-de-sac. It was necessary to proceed with endovaginal exam following the transabdominal exam to visualize the endometrium and ovaries. Color and duplex Doppler ultrasound was utilized to evaluate blood flow to the ovaries. COMPARISON:  None FINDINGS: Uterus Measurements: 6.4 x 3.6 x 4.0 cm. Normal morphology without mass. Endometrium Thickness: 6 mm thick, normal. IUD within upper to mid uterus. No focal endometrial abnormality. Tiny amount of endocervical fluid. Right ovary Measurements: 5.43.5 x 4.4 cm. Large simple appearing cyst 5.2 x 3.2 x 4.3 cm. Blood  flow present within RIGHT ovary on color Doppler imaging. Left ovary Measurements: 3.8 x 2.5 x 3.0 cm. Dominant follicle 2.8 x 1.8 x 2.3 cm. Blood flow within LEFT ovary on color Doppler imaging. Pulsed Doppler evaluation of both ovaries demonstrates normal low-resistance arterial and venous waveforms. Other findings No free pelvic fluid or additional adnexal masses. IMPRESSION: IUD within uterus. Large RIGHT ovarian cyst 5.2 cm diameter with smaller LEFT ovarian follicle cyst 2.8 cm diameter. No evidence of ovarian torsion or additional adnexal mass. Electronically Signed   By:  Ulyses SouthwardMark  Boles M.D.   On: 05/28/2016 09:53   [place] Pelvis Complete  Result Date: 05/28/2016 CLINICAL DATA:  RIGHT lower quadrant pain, nausea for 4 days, negative pregnancy test, IUD EXAM: TRANSABDOMINAL AND TRANSVAGINAL ULTRASOUND OF PELVIS DOPPLER ULTRASOUND OF OVARIES TECHNIQUE: Both transabdominal and transvaginal ultrasound examinations of the pelvis were performed. Transabdominal technique was performed for global imaging of the pelvis including uterus, ovaries, adnexal regions, and pelvic cul-de-sac. It was necessary to proceed with endovaginal exam following the transabdominal exam to visualize the endometrium and ovaries. Color and duplex Doppler ultrasound was utilized to evaluate blood flow to the ovaries. COMPARISON:  None FINDINGS: Uterus Measurements: 6.4 x 3.6 x 4.0 cm. Normal morphology without mass. Endometrium Thickness: 6 mm thick, normal. IUD within upper to mid uterus. No focal endometrial abnormality. Tiny amount of endocervical fluid. Right ovary Measurements: 5.43.5 x 4.4 cm. Large simple appearing cyst 5.2 x 3.2 x 4.3 cm. Blood flow present within RIGHT ovary on color Doppler imaging. Left ovary Measurements: 3.8 x 2.5 x 3.0 cm. Dominant follicle 2.8 x 1.8 x 2.3 cm. Blood flow within LEFT ovary on color Doppler imaging. Pulsed Doppler evaluation of both ovaries demonstrates normal low-resistance arterial and venous waveforms. Other findings No free pelvic fluid or additional adnexal masses. IMPRESSION: IUD within uterus. Large RIGHT ovarian cyst 5.2 cm diameter with smaller LEFT ovarian follicle cyst 2.8 cm diameter. No evidence of ovarian torsion or additional adnexal mass. Electronically Signed   By: Ulyses SouthwardMark  Boles M.D.   On: 05/28/2016 09:53   Ct Abdomen Pelvis W Contrast  Result Date: 05/28/2016 CLINICAL DATA:  Right lower quadrant pain, nausea, normal WBC, bilateral ovarian cyst EXAM: CT ABDOMEN AND PELVIS WITH CONTRAST TECHNIQUE: Multidetector CT imaging of the abdomen and pelvis was  performed using the standard protocol following bolus administration of intravenous contrast. CONTRAST:  100mL ISOVUE-300 IOPAMIDOL (ISOVUE-300) INJECTION 61% COMPARISON:  05/28/2016 FINDINGS: Lung bases are unremarkable. Enhanced liver is unremarkable. Moderate distended gallbladder. There is a noncalcified gallstone in gallbladder neck region measures 1.8 cm. No pericholecystic fluid is noted. CBD measures 7 mm in diameter. Enhanced pancreas is unremarkable. Enhanced spleen is unremarkable.  Small accessory splenule. No adrenal gland mass. No hydronephrosis or hydroureter. No calcified ureteral calculi. Mild distended urinary bladder. Next item no small bowel obstruction. No thickened or dilated small bowel loops. There is no pericecal inflammation. Normal appendix is noted in axial image 61. The terminal ileum is unremarkable. No colitis or diverticulitis. No aortic aneurysm.  No retroperitoneal or mesenteric adenopathy. IUD noted within anteflexed uterus. There is a right ovarian cyst measures 5.2 cm. A left ovarian cyst measures 2.9 cm. No pelvic free fluid is noted. There is no inguinal adenopathy. IMPRESSION: 1. There is moderate distension of the gallbladder. No pericholecystic fluid is noted. Non calcified gallstone is noted in gallbladder neck region measures 2.1 cm. Further correlation with gallbladder ultrasound is recommended. 2. No hydronephrosis or hydroureter. 3. Normal appendix.  No pericecal inflammation. 4. No evidence of colitis or diverticulitis. 5. IUD with in place. Right ovarian cyst measures 5.2 cm. Left ovarian cyst measures 2.9 cm. Electronically Signed   By: Natasha MeadLiviu  Pop M.D.   On: 05/28/2016 12:49   [place] Art/ven Flow Abd Pelv Doppler  Result Date: 05/28/2016 CLINICAL DATA:  RIGHT lower quadrant pain, nausea for 4 days, negative pregnancy test, IUD EXAM: TRANSABDOMINAL AND TRANSVAGINAL ULTRASOUND OF PELVIS DOPPLER ULTRASOUND OF OVARIES TECHNIQUE: Both transabdominal and transvaginal  ultrasound examinations of the pelvis were performed. Transabdominal technique was performed for global imaging of the pelvis including uterus, ovaries, adnexal regions, and pelvic cul-de-sac. It was necessary to proceed with endovaginal exam following the transabdominal exam to visualize the endometrium and ovaries. Color and duplex Doppler ultrasound was utilized to evaluate blood flow to the ovaries. COMPARISON:  None FINDINGS: Uterus Measurements: 6.4 x 3.6 x 4.0 cm. Normal morphology without mass. Endometrium Thickness: 6 mm thick, normal. IUD within upper to mid uterus. No focal endometrial abnormality. Tiny amount of endocervical fluid. Right ovary Measurements: 5.43.5 x 4.4 cm. Large simple appearing cyst 5.2 x 3.2 x 4.3 cm. Blood flow present within RIGHT ovary on color Doppler imaging. Left ovary Measurements: 3.8 x 2.5 x 3.0 cm. Dominant follicle 2.8 x 1.8 x 2.3 cm. Blood flow within LEFT ovary on color Doppler imaging. Pulsed Doppler evaluation of both ovaries demonstrates normal low-resistance arterial and venous waveforms. Other findings No free pelvic fluid or additional adnexal masses. IMPRESSION: IUD within uterus. Large RIGHT ovarian cyst 5.2 cm diameter with smaller LEFT ovarian follicle cyst 2.8 cm diameter. No evidence of ovarian torsion or additional adnexal mass. Electronically Signed   By: Ulyses SouthwardMark  Boles M.D.   On: 05/28/2016 09:53   Dg Abd Acute W/chest  Result Date: 05/28/2016 CLINICAL DATA:  Right lower quadrant pain starting Friday EXAM: DG ABDOMEN ACUTE W/ 1V CHEST COMPARISON:  None. FINDINGS: Cardiomediastinal silhouette is unremarkable. No acute infiltrate or pleural effusion. No pulmonary edema. Bony thorax is unremarkable. There is normal small bowel gas pattern. No evidence of free abdominal air. Some colonic stool and gas noted in right colon. IUD noted midline pelvis. IMPRESSION: Negative abdominal radiographs.  No acute cardiopulmonary disease. Electronically Signed   By: Natasha MeadLiviu   Pop M.D.   On: 05/28/2016 09:13   [place] Abdomen Limited Ruq  Result Date: 05/28/2016 CLINICAL DATA:  Right-sided abdominal pain EXAM: US ABDOMEN LIMITED - RIGHT UPPER QUADRANT COMPARISON:  None. FINDINGS: Gallbladder: Well distended with evidence of cholelithiasis in the region of the neck. These changes would be consistent with a centrally obstructing stone. No will significant wall thickening or pericholecystic fluid is noted. Common bile duct: Diameter: 3 mm. Liver: No focal lesion identified. Within normal limits in parenchymal echogenicity. IMPRESSION: Cholelithiasis with dilatation of the gallbladder. This may be obstructive in nature given that seen on recent CT examination. HIDA scan may be helpful to assess for obstruction. Electronically Signed   By: Alcide CleverMark  Lukens M.D.   On: 05/28/2016 15:32    Assessment/Plan: We will get a HIDA scan this morning to look it or ductal obstruction. I think her symptoms are more consistent with biliary tract disease. We'll keep her nothing by mouth and then if her HIDA scan shows obstruction we will consider surgical intervention. She is in agreement with this plan.

## 2016-05-29 NOTE — Op Note (Signed)
05/28/2016 - 05/29/2016  5:55 PM  PATIENT:  Taylor [name]  30 y.o. female  PRE-OPERATIVE DIAGNOSIS:  bilary tract disease  POST-OPERATIVE DIAGNOSIS:  bilary tract disease  PROCEDURE:  Procedure(s): LAPAROSCOPIC CHOLECYSTECTOMY (N/A)  SURGEON:  Surgeon(s) and Role:    * Tiney Rougealph Ely III, MD - Primary   ASSISTANTS: none   ANESTHESIA:   general  EBL:  Total I/O In: 1000 [I.V.:1000] Out: 30 [Blood:30]   DRAINS: (2) Jackson-Pratt drain(s) with closed bulb suction in the hepatic fossa and sub phrenic space   LOCAL MEDICATIONS USED:  MARCAINE      DISPOSITION OF SPECIMEN:  PATHOLOGY   DICTATION: .Dragon Dictation with the patient in supine position and after induction of appropriate general anesthesia the patient's abdomen was prepped ChloraPrep and draped sterile towels. The patient was placed headdown feet up position and a small infraumbilical incision was made in the standard fashion. A varies needle was used to cannulate peritoneal cavity and CO2 was insufflated. However, during this Trendelenburg position the patient developed some significant hypoxia. Abdomen was desufflated and the patient elevated using the head of the bed and the symptoms resolved.  Intraperitoneal position was confirmed. CO2 was reinsufflated. A subxiphoid transverse incision was made and an 11 mm port inserted under direct vision. The pelvis was inspected hoping to visualize the ovaries. However I could not place the patient in the standard Trendelenburg position and were unable to visualize the ovaries.  The patient's gallbladder was markedly distended thickened with multiple adhesions. 2 lateral ports 5 mm in size were placed under direct vision. The gallbladder was significantly enlarged and folded back on itself. It was aspirated of about 125 cc of clear bile. Tedious dissection was required to expose the hepatoduodenal ligament. The right hepatic artery and common bile duct were identified first. 2  additional ports were placed provide retraction and better visualization. An angled scope was utilized. Initially the cystic artery was identified of the right hepatic artery. It was doubly clipped and divided. This maneuver allowed for slight elevation of the gallbladder which to expose the cystic duct. The common duct was again confirmed. The duct was foreshortened and no attempt was made to perform a cholangiogram. Duct was doubly clipped and divided. Gallbladder was then dissected free of its bed in the liver used no cautery apparatus. Dissection was quite tedious. Gallbladder was so large and folded back on itself and there was no plane between the gallbladder and the liver. Presented multiple times. Gallbladder side was entered place and one large stone lost initially. I was eventually removed from the bed of the liver captured in an Endo Catch apparatus removed through the subxiphoid incision.  The area was then irrigated and the stone identified. It was captured in an Endo Catch apparatus and removed subxiphoid incision. The liver panel today ligament were carefully reviewed. No significant bleeding sites were identified. However because of the significant dissection the problems encountered during the dissection 2 drains were placed both coming out the lateral stab wounds one in the suprahepatic subphrenic space and one in this hepatic fossa where the gallbladder had been. The drains were secured with 3-0 nylon. The upper midline incision was closed with figure-of-eight suture of 0 Vicryl using the suture passer. Skin edges were all closed with 5-0 nylon. There was infiltrated with 0.25% Marcaine with the plan of providing some postoperative pain control. Sterile dressings were applied. Patient was returned to recovery room in satisfactory condition and tolerated procedure well. Sponge estimate needle  count were correct 2 in the operating room.  PLAN OF CARE: Admit to inpatient   PATIENT DISPOSITION:   PACU - hemodynamically stable.   Tiney Rougealph Ely III, MD

## 2016-05-29 NOTE — Transfer of Care (Signed)
Immediate Anesthesia Transfer of Care Note  Patient: Taylor RaymondRebecca A Coppedge  Procedure(s) Performed: Procedure(s): LAPAROSCOPIC CHOLECYSTECTOMY (N/A)  Patient Location: PACU  Anesthesia Type:General  Level of Consciousness: sedated  Airway & Oxygen Therapy: Patient Spontanous Breathing and Patient connected to face mask oxygen  Post-op Assessment: Report given to RN and Post -op Vital signs reviewed and stable  Post vital signs: Reviewed and stable  Last Vitals:  Vitals:   05/29/16 1231 05/29/16 1758  BP: 135/76 127/80  Pulse: 69 85  Resp: 16 18  Temp: (!) 35.9 C (!) 36.1 C    Last Pain:  Vitals:   05/29/16 1758  TempSrc: Tympanic  PainSc:       Patients Stated Pain Goal: 1 (05/28/16 2000)  Complications: No apparent anesthesia complications

## 2016-05-29 NOTE — Anesthesia Preprocedure Evaluation (Signed)
Anesthesia Evaluation  Patient identified by MRN, date of birth, ID band Patient awake    Reviewed: Allergy & Precautions, NPO status , Patient's Chart, lab work & pertinent test results  History of Anesthesia Complications Negative for: history of anesthetic complications  Airway Mallampati: II  TM Distance: >3 FB Neck ROM: Full    Dental no notable dental hx.    Pulmonary neg COPD, former smoker,    breath sounds clear to auscultation- rhonchi (-) wheezing      Cardiovascular Exercise Tolerance: Good hypertension (hx of HTN, not currently on medication), (-) CAD and (-) Past MI  Rhythm:Regular Rate:Normal - Systolic murmurs and - Diastolic murmurs    Neuro/Psych Depression negative neurological ROS     GI/Hepatic negative GI ROS, Neg liver ROS,   Endo/Other  neg diabetesHypothyroidism   Renal/GU negative Renal ROS     Musculoskeletal negative musculoskeletal ROS (+)   Abdominal (+) + obese,   Peds  Hematology negative hematology ROS (+)   Anesthesia Other Findings   Reproductive/Obstetrics                             Anesthesia Physical Anesthesia Plan  ASA: II  Anesthesia Plan: General   Post-op Pain Management:    Induction: Intravenous  Airway Management Planned: Oral ETT  Additional Equipment:   Intra-op Plan:   Post-operative Plan: Extubation in OR  Informed Consent: I have reviewed the patients History and Physical, chart, labs and discussed the procedure including the risks, benefits and alternatives for the proposed anesthesia with the patient or authorized representative who has indicated his/her understanding and acceptance.   Dental advisory given  Plan Discussed with: Anesthesiologist and CRNA  Anesthesia Plan Comments:         Anesthesia Quick Evaluation

## 2016-05-29 NOTE — Anesthesia Procedure Notes (Signed)
Procedure Name: Intubation Date/Time: 05/29/2016 3:00 PM Performed by: Henrietta HooverPOPE, Jakylan Ron Pre-anesthesia Checklist: Patient identified, Emergency Drugs available, Suction available, Patient being monitored and Timeout performed Patient Re-evaluated:Patient Re-evaluated prior to inductionOxygen Delivery Method: Circle system utilized Preoxygenation: Pre-oxygenation with 100% oxygen Intubation Type: IV induction Ventilation: Mask ventilation without difficulty Laryngoscope Size: McGraph and 3 Grade View: Grade I Tube type: Oral Tube size: 7.0 mm Number of attempts: 2 Airway Equipment and Method: Stylet Placement Confirmation: ETT inserted through vocal cords under direct vision,  positive ETCO2 and breath sounds checked- equal and bilateral Secured at: 22 cm Tube secured with: Tape Dental Injury: Teeth and Oropharynx as per pre-operative assessment

## 2016-05-29 NOTE — Anesthesia Postprocedure Evaluation (Signed)
Anesthesia Post Note  Patient: Taylor RaymondRebecca A [name]  Procedure(s) Performed: Procedure(s) (LRB): LAPAROSCOPIC CHOLECYSTECTOMY (N/A)  Patient location during evaluation: PACU Anesthesia Type: General Level of consciousness: awake and alert Pain management: pain level controlled Vital Signs Assessment: post-procedure vital signs reviewed and stable Respiratory status: spontaneous breathing, nonlabored ventilation, respiratory function stable and patient connected to nasal cannula oxygen Cardiovascular status: blood pressure returned to baseline and stable Postop Assessment: no signs of nausea or vomiting Anesthetic complications: no    Last Vitals:  Vitals:   05/29/16 1231 05/29/16 1758  BP: 135/76 127/80  Pulse: 69 85  Resp: 16 18  Temp: (!) 35.9 C (!) 36.1 C    Last Pain:  Vitals:   05/29/16 1800  TempSrc:   PainSc: 8                  Forrest Jaroszewski K Bobetta Korf

## 2016-05-29 NOTE — OR Nursing (Signed)
MD decided to cancel the cholangiogram at 1636.

## 2016-05-29 NOTE — Plan of Care (Signed)
Problem: Pain Managment: Goal: General experience of comfort will improve Outcome: Progressing Pt transferred from PACU upon shift change. Pt post cholecystectomy. VSS. O2 sats at 97% on 2L O2 per [place]. Abdominal dressings clean dry and intact. 2xJP sites draining serosanguineous drainage.  No c/o nausea. Pt c/o abdominal pain.

## 2016-05-30 ENCOUNTER — Encounter: Payer: Self-pay | Admitting: Surgery

## 2016-05-30 LAB — COMPREHENSIVE METABOLIC PANEL
ALT: 70 U/L — AB (ref 14–54)
AST: 66 U/L — ABNORMAL HIGH (ref 15–41)
Albumin: 3.3 g/dL — ABNORMAL LOW (ref 3.5–5.0)
Alkaline Phosphatase: 71 U/L (ref 38–126)
Anion gap: 6 (ref 5–15)
BILIRUBIN TOTAL: 0.6 mg/dL (ref 0.3–1.2)
BUN: 7 mg/dL (ref 6–20)
CALCIUM: 8 mg/dL — AB (ref 8.9–10.3)
CHLORIDE: 105 mmol/L (ref 101–111)
CO2: 26 mmol/L (ref 22–32)
CREATININE: 0.6 mg/dL (ref 0.44–1.00)
Glucose, Bld: 137 mg/dL — ABNORMAL HIGH (ref 65–99)
Potassium: 4.1 mmol/L (ref 3.5–5.1)
Sodium: 137 mmol/L (ref 135–145)
TOTAL PROTEIN: 7.2 g/dL (ref 6.5–8.1)

## 2016-05-30 LAB — CBC
HEMATOCRIT: 37 % (ref 35.0–47.0)
Hemoglobin: 12.3 g/dL (ref 12.0–16.0)
MCH: 28.3 pg (ref 26.0–34.0)
MCHC: 33.2 g/dL (ref 32.0–36.0)
MCV: 85.3 fL (ref 80.0–100.0)
PLATELETS: 311 10*3/uL (ref 150–440)
RBC: 4.33 MIL/uL (ref 3.80–5.20)
RDW: 15.6 % — ABNORMAL HIGH (ref 11.5–14.5)
WBC: 10.2 10*3/uL (ref 3.6–11.0)

## 2016-05-30 NOTE — Progress Notes (Signed)
Notified Dr. Michela PitcherEly that patient is constipated and reports that she is not passing gas, no bowel regimen ordered. Per MD he does not wish to start bowel regimen at this time, via telephone.

## 2016-05-30 NOTE — Progress Notes (Signed)
Subjective:   She is feeling better this morning after rough night. She had significant pain issues last evening. She is alert more comfortable with no fever. She does complain of some discomfort on her right back and right flank. She has no right lower quadrant or pelvic pain.  Vital signs in last 24 hours: Temp:  [96.6 F (35.9 C)-98.4 F (36.9 C)] 98 F (36.7 C) (08/17 0559) Pulse Rate:  [69-88] 78 (08/17 0559) Resp:  [12-20] 19 (08/17 0559) BP: (111-140)/(65-89) 133/89 (08/17 0559) SpO2:  [88 %-100 %] 96 % (08/17 0559) Weight:  [155.1 kg (342 lb)] 155.1 kg (342 lb) (08/16 1231) Last BM Date: 05/27/16 (per pt)  Intake/Output from previous day: 08/16 0701 - 08/17 0700 In: 3525 [P.O.:360; I.V.:2300; IV Piggyback:400] Out: 730 [Urine:500; Drains:200; Blood:30]  Exam:  Her abdomen is soft with no significant abdominal tenderness and minimal drainage around the drains. She does have active bowel sounds.  Lab Results:  CBC  Recent Labs  05/29/16 0432 05/30/16 0316  WBC 7.9 10.2  HGB 12.3 12.3  HCT 37.8 37.0  PLT 291 311   CMP     Component Value Date/Time   NA 137 05/30/2016 0316   NA 140 08/25/2014 1300   K 4.1 05/30/2016 0316   K 3.6 08/25/2014 1300   CL 105 05/30/2016 0316   CL 111 (H) 08/25/2014 1300   CO2 26 05/30/2016 0316   CO2 22 08/25/2014 1300   GLUCOSE 137 (H) 05/30/2016 0316   GLUCOSE 87 08/25/2014 1300   BUN 7 05/30/2016 0316   BUN 5 (L) 08/25/2014 1300   CREATININE 0.60 05/30/2016 0316   CREATININE 0.53 (L) 08/25/2014 1300   CALCIUM 8.0 (L) 05/30/2016 0316   CALCIUM 7.9 (L) 08/25/2014 1300   PROT 7.2 05/30/2016 0316   PROT 6.3 (L) 08/25/2014 1300   ALBUMIN 3.3 (L) 05/30/2016 0316   ALBUMIN 2.4 (L) 08/25/2014 1300   AST 66 (H) 05/30/2016 0316   AST 8 (L) 08/25/2014 1300   ALT 70 (H) 05/30/2016 0316   ALT 13 (L) 08/25/2014 1300   ALKPHOS 71 05/30/2016 0316   ALKPHOS 77 08/25/2014 1300   BILITOT 0.6 05/30/2016 0316   BILITOT 0.2 08/25/2014 1300    GFRNONAA >60 05/30/2016 0316   GFRNONAA >60 08/25/2014 1300   GFRNONAA >60 12/11/2012 0623   GFRAA >60 05/30/2016 0316   GFRAA >60 08/25/2014 1300   GFRAA >60 12/11/2012 0623   PT/INR No results for input(s): LABPROT, INR in the last 72 hours.  Studies/Results: Nm Hepatobiliary Liver Func  Result Date: 05/29/2016 CLINICAL DATA:  Right-sided abdominal pain, nausea and vomiting. Evidence of cholelithiasis and gallbladder distension by ultrasound and potentially impacted calculus in the gallbladder neck by CT. EXAM: NUCLEAR MEDICINE HEPATOBILIARY IMAGING TECHNIQUE: Sequential images of the abdomen were obtained out to 60 minutes following intravenous administration of radiopharmaceutical. RADIOPHARMACEUTICALS:  5.4 mCi [id number]  Choletec IV COMPARISON:  Ultrasound and CT studies on 05/28/2016 FINDINGS: Prompt uptake and biliary excretion of activity by the liver is seen. Biliary activity passes into small bowel, consistent with a patent common bile duct. There is nonvisualization of the gallbladder over 1 hour of imaging consistent with cystic duct obstruction/cholecystitis. IMPRESSION: Findings by hepatobiliary nuclear medicine imaging consistent with cystic duct obstruction/cholecystitis. These results were called by telephone at the time of interpretation on 05/29/2016 at 9:25 AM to Dr. Tiney RougeALPH ELY III , who verbally acknowledged these results. Electronically Signed   By: Irish LackGlenn  Yamagata M.D.   On:  05/29/2016 09:31   Ct Abdomen Pelvis W Contrast  Result Date: 05/28/2016 CLINICAL DATA:  Right lower quadrant pain, nausea, normal WBC, bilateral ovarian cyst EXAM: CT ABDOMEN AND PELVIS WITH CONTRAST TECHNIQUE: Multidetector CT imaging of the abdomen and pelvis was performed using the standard protocol following bolus administration of intravenous contrast. CONTRAST:  100mL ISOVUE-300 IOPAMIDOL (ISOVUE-300) INJECTION 61% COMPARISON:  05/28/2016 FINDINGS: Lung bases are unremarkable. Enhanced liver is  unremarkable. Moderate distended gallbladder. There is a noncalcified gallstone in gallbladder neck region measures 1.8 cm. No pericholecystic fluid is noted. CBD measures 7 mm in diameter. Enhanced pancreas is unremarkable. Enhanced spleen is unremarkable.  Small accessory splenule. No adrenal gland mass. No hydronephrosis or hydroureter. No calcified ureteral calculi. Mild distended urinary bladder. Next item no small bowel obstruction. No thickened or dilated small bowel loops. There is no pericecal inflammation. Normal appendix is noted in axial image 61. The terminal ileum is unremarkable. No colitis or diverticulitis. No aortic aneurysm.  No retroperitoneal or mesenteric adenopathy. IUD noted within anteflexed uterus. There is a right ovarian cyst measures 5.2 cm. A left ovarian cyst measures 2.9 cm. No pelvic free fluid is noted. There is no inguinal adenopathy. IMPRESSION: 1. There is moderate distension of the gallbladder. No pericholecystic fluid is noted. Non calcified gallstone is noted in gallbladder neck region measures 2.1 cm. Further correlation with gallbladder ultrasound is recommended. 2. No hydronephrosis or hydroureter. 3. Normal appendix.  No pericecal inflammation. 4. No evidence of colitis or diverticulitis. 5. IUD with in place. Right ovarian cyst measures 5.2 cm. Left ovarian cyst measures 2.9 cm. Electronically Signed   By: Natasha MeadLiviu  Pop M.D.   On: 05/28/2016 12:49   [place] Abdomen Limited Ruq  Result Date: 05/28/2016 CLINICAL DATA:  Right-sided abdominal pain EXAM: US ABDOMEN LIMITED - RIGHT UPPER QUADRANT COMPARISON:  None. FINDINGS: Gallbladder: Well distended with evidence of cholelithiasis in the region of the neck. These changes would be consistent with a centrally obstructing stone. No will significant wall thickening or pericholecystic fluid is noted. Common bile duct: Diameter: 3 mm. Liver: No focal lesion identified. Within normal limits in parenchymal echogenicity. IMPRESSION:  Cholelithiasis with dilatation of the gallbladder. This may be obstructive in nature given that seen on recent CT examination. HIDA scan may be helpful to assess for obstruction. Electronically Signed   By: Alcide CleverMark  Lukens M.D.   On: 05/28/2016 15:32    Assessment/Plan: We will increase her diet and activity level. We will leave her drains another 24 hours. We will plan to discharge her hopefully within the next 24-48 hours. I discussed the plan with her in detail. She is in agreement

## 2016-05-30 NOTE — Plan of Care (Signed)
Problem: Pain Managment: Goal: General experience of comfort will improve Outcome: Not Progressing Alternating administrations of Dilaudid and Norco this shift.  Patient still experiencing significant amounts of pain.

## 2016-05-31 LAB — SURGICAL PATHOLOGY

## 2016-05-31 NOTE — Consult Note (Signed)
CH initiated pastoral dialogue; PT appeared content & expressed some concern about her 418 month daughter at home. CH provided spiritual support and prayer.

## 2016-05-31 NOTE — Progress Notes (Addendum)
Pt had dark clots coming from perineal area after voiding. Pt report she did not usually bleed. There were streaks of red when pt was wiped. Will continue to monitor.

## 2016-05-31 NOTE — Progress Notes (Signed)
Subjective:   She is feeling better today although continues to have abdominal pain. She has not been nauseated but is only taking small amounts of soft diet. She is voiding spontaneously. She's able to ambulate with some assistance. Her JP drainage is significantly decreased.  Vital signs in last 24 hours: Temp:  [98 F (36.7 C)-98.9 F (37.2 C)] 98 F (36.7 C) (08/18 0433) Pulse Rate:  [69-84] 69 (08/18 0433) Resp:  [18] 18 (08/17 2010) BP: (128-138)/(78-91) 134/91 (08/18 0433) SpO2:  [98 %-99 %] 99 % (08/18 0433) Last BM Date: 05/27/16  Intake/Output from previous day: 08/17 0701 - 08/18 0700 In: 2089.6 [I.V.:1889.6; IV Piggyback:200] Out: 1921 [Urine:1800; Drains:121]  Exam:  Her wounds look good. There is no sign of any infection. There is no evidence of any significant Donald discomfort.  Lab Results:  CBC  Recent Labs  05/29/16 0432 05/30/16 0316  WBC 7.9 10.2  HGB 12.3 12.3  HCT 37.8 37.0  PLT 291 311   CMP     Component Value Date/Time   NA 137 05/30/2016 0316   NA 140 08/25/2014 1300   K 4.1 05/30/2016 0316   K 3.6 08/25/2014 1300   CL 105 05/30/2016 0316   CL 111 (H) 08/25/2014 1300   CO2 26 05/30/2016 0316   CO2 22 08/25/2014 1300   GLUCOSE 137 (H) 05/30/2016 0316   GLUCOSE 87 08/25/2014 1300   BUN 7 05/30/2016 0316   BUN 5 (L) 08/25/2014 1300   CREATININE 0.60 05/30/2016 0316   CREATININE 0.53 (L) 08/25/2014 1300   CALCIUM 8.0 (L) 05/30/2016 0316   CALCIUM 7.9 (L) 08/25/2014 1300   PROT 7.2 05/30/2016 0316   PROT 6.3 (L) 08/25/2014 1300   ALBUMIN 3.3 (L) 05/30/2016 0316   ALBUMIN 2.4 (L) 08/25/2014 1300   AST 66 (H) 05/30/2016 0316   AST 8 (L) 08/25/2014 1300   ALT 70 (H) 05/30/2016 0316   ALT 13 (L) 08/25/2014 1300   ALKPHOS 71 05/30/2016 0316   ALKPHOS 77 08/25/2014 1300   BILITOT 0.6 05/30/2016 0316   BILITOT 0.2 08/25/2014 1300   GFRNONAA >60 05/30/2016 0316   GFRNONAA >60 08/25/2014 1300   GFRNONAA >60 12/11/2012 0623   GFRAA >60  05/30/2016 0316   GFRAA >60 08/25/2014 1300   GFRAA >60 12/11/2012 0623   PT/INR No results for input(s): LABPROT, INR in the last 72 hours.  Studies/Results: No results found.  Assessment/Plan: I removed her JP drains. We will increase her activity stop her antibiotics and see if we can mobilize her towards discharge.

## 2016-06-01 MED ORDER — ONDANSETRON HCL 4 MG/2ML IJ SOLN
4.0000 mg | Freq: Four times a day (QID) | INTRAMUSCULAR | Status: DC | PRN
Start: 2016-06-01 — End: 2016-06-03
  Administered 2016-06-01: 08:00:00 4 mg via INTRAVENOUS
  Filled 2016-06-01: qty 2

## 2016-06-01 NOTE — Progress Notes (Signed)
Subjective:   She is feeling better with minimal nausea and no significant abdominal pain. She does have some pain with bending but her back pain is improved. She's had no fever or chills. She's not eating much as yet and has not had a bowel movement but is passing gas.  Vital signs in last 24 hours: Temp:  [98 F (36.7 C)-98.4 F (36.9 C)] 98 F (36.7 C) (08/19 0509) Pulse Rate:  [64-78] 67 (08/19 0509) Resp:  [16-18] 16 (08/19 0509) BP: (109-116)/(64-74) 109/74 (08/19 0509) SpO2:  [96 %-98 %] 98 % (08/19 0509) Last BM Date: 05/27/16  Intake/Output from previous day: No intake/output data recorded.  Exam:  Her wounds look good. There is no sign of any infection. She does not have any drainage from the drain site.  Lab Results:  CBC  Recent Labs  05/30/16 0316  WBC 10.2  HGB 12.3  HCT 37.0  PLT 311   CMP     Component Value Date/Time   NA 137 05/30/2016 0316   NA 140 08/25/2014 1300   K 4.1 05/30/2016 0316   K 3.6 08/25/2014 1300   CL 105 05/30/2016 0316   CL 111 (H) 08/25/2014 1300   CO2 26 05/30/2016 0316   CO2 22 08/25/2014 1300   GLUCOSE 137 (H) 05/30/2016 0316   GLUCOSE 87 08/25/2014 1300   BUN 7 05/30/2016 0316   BUN 5 (L) 08/25/2014 1300   CREATININE 0.60 05/30/2016 0316   CREATININE 0.53 (L) 08/25/2014 1300   CALCIUM 8.0 (L) 05/30/2016 0316   CALCIUM 7.9 (L) 08/25/2014 1300   PROT 7.2 05/30/2016 0316   PROT 6.3 (L) 08/25/2014 1300   ALBUMIN 3.3 (L) 05/30/2016 0316   ALBUMIN 2.4 (L) 08/25/2014 1300   AST 66 (H) 05/30/2016 0316   AST 8 (L) 08/25/2014 1300   ALT 70 (H) 05/30/2016 0316   ALT 13 (L) 08/25/2014 1300   ALKPHOS 71 05/30/2016 0316   ALKPHOS 77 08/25/2014 1300   BILITOT 0.6 05/30/2016 0316   BILITOT 0.2 08/25/2014 1300   GFRNONAA >60 05/30/2016 0316   GFRNONAA >60 08/25/2014 1300   GFRNONAA >60 12/11/2012 0623   GFRAA >60 05/30/2016 0316   GFRAA >60 08/25/2014 1300   GFRAA >60 12/11/2012 0623   PT/INR No results for input(s):  LABPROT, INR in the last 72 hours.  Studies/Results: No results found.  Assessment/Plan: She is making good progress. We'll increase her activity and tentatively plan discharge tomorrow. She is in agreement with this plan

## 2016-06-02 ENCOUNTER — Observation Stay: Payer: 59

## 2016-06-02 MED ORDER — OXYCODONE-ACETAMINOPHEN 7.5-325 MG PO TABS
2.0000 | ORAL_TABLET | Freq: Four times a day (QID) | ORAL | Status: DC | PRN
  Administered 2016-06-02 – 2016-06-03 (×6): 2 via ORAL
  Filled 2016-06-02 (×6): qty 2

## 2016-06-02 NOTE — Progress Notes (Signed)
She:   She is feeling better today but still has significant pain. She has no nausea and is tolerating regular diet. She'll [place] back and flank pain. She also had some vaginal drainage. She feels like her pain medication regimen is not controlling her symptoms and she still requiring IV Dilaudid medication.  Vital signs in last 24 hours: Temp:  [97.9 F (36.6 C)-98.2 F (36.8 C)] 97.9 F (36.6 C) (08/20 0538) Pulse Rate:  [56-71] 69 (08/20 0538) Resp:  [20] 20 (08/19 2003) BP: (115-151)/(63-83) 122/83 (08/20 0538) SpO2:  [98 %-99 %] 98 % (08/20 0538) Last BM Date: 05/27/16  Intake/Output from previous day: 08/19 0701 - 08/20 0700 In: 240 [P.O.:240] Out: -   Exam:  Her abdomen is soft with no significant abdominal problems. She has good wound healing and no evidence of any significant drainage. She has active bowel sounds and only incisional pain.  Lab Results:  CBC No results for input(s): WBC, HGB, HCT, PLT in the last 72 hours. CMP     Component Value Date/Time   NA 137 05/30/2016 0316   NA 140 08/25/2014 1300   K 4.1 05/30/2016 0316   K 3.6 08/25/2014 1300   CL 105 05/30/2016 0316   CL 111 (H) 08/25/2014 1300   CO2 26 05/30/2016 0316   CO2 22 08/25/2014 1300   GLUCOSE 137 (H) 05/30/2016 0316   GLUCOSE 87 08/25/2014 1300   BUN 7 05/30/2016 0316   BUN 5 (L) 08/25/2014 1300   CREATININE 0.60 05/30/2016 0316   CREATININE 0.53 (L) 08/25/2014 1300   CALCIUM 8.0 (L) 05/30/2016 0316   CALCIUM 7.9 (L) 08/25/2014 1300   PROT 7.2 05/30/2016 0316   PROT 6.3 (L) 08/25/2014 1300   ALBUMIN 3.3 (L) 05/30/2016 0316   ALBUMIN 2.4 (L) 08/25/2014 1300   AST 66 (H) 05/30/2016 0316   AST 8 (L) 08/25/2014 1300   ALT 70 (H) 05/30/2016 0316   ALT 13 (L) 08/25/2014 1300   ALKPHOS 71 05/30/2016 0316   ALKPHOS 77 08/25/2014 1300   BILITOT 0.6 05/30/2016 0316   BILITOT 0.2 08/25/2014 1300   GFRNONAA >60 05/30/2016 0316   GFRNONAA >60 08/25/2014 1300   GFRNONAA >60 12/11/2012 0623    GFRAA >60 05/30/2016 0316   GFRAA >60 08/25/2014 1300   GFRAA >60 12/11/2012 0623   PT/INR No results for input(s): LABPROT, INR in the last 72 hours.  Studies/Results: No results found.  Assessment/Plan: Her workup converting her from her IV pain medication and trying to get her completely on by mouth meds. At this point we anticipate discharge at any time pain control remains an issue.

## 2016-06-02 NOTE — Progress Notes (Deleted)
Subjective:   She is comfortable this morning with no more complaints. She's breathing easily with no shortness of breath. Chest x-ray shows a slight increase in her pneumothorax. She continues to have an air leak.  Vital signs in last 24 hours: Temp:  [97.9 F (36.6 C)-98.2 F (36.8 C)] 97.9 F (36.6 C) (08/20 0538) Pulse Rate:  [56-71] 69 (08/20 0538) Resp:  [20] 20 (08/19 2003) BP: (115-151)/(63-83) 122/83 (08/20 0538) SpO2:  [98 %-99 %] 98 % (08/20 0538) Last BM Date: 05/27/16  Intake/Output from previous day: 08/19 0701 - 08/20 0700 In: 240 [P.O.:240] Out: -   Exam:  Her lung is clear. There is no sign of any wheezing or shortness of breath.  Lab Results:  CBC No results for input(s): WBC, HGB, HCT, PLT in the last 72 hours. CMP     Component Value Date/Time   NA 137 05/30/2016 0316   NA 140 08/25/2014 1300   K 4.1 05/30/2016 0316   K 3.6 08/25/2014 1300   CL 105 05/30/2016 0316   CL 111 (H) 08/25/2014 1300   CO2 26 05/30/2016 0316   CO2 22 08/25/2014 1300   GLUCOSE 137 (H) 05/30/2016 0316   GLUCOSE 87 08/25/2014 1300   BUN 7 05/30/2016 0316   BUN 5 (L) 08/25/2014 1300   CREATININE 0.60 05/30/2016 0316   CREATININE 0.53 (L) 08/25/2014 1300   CALCIUM 8.0 (L) 05/30/2016 0316   CALCIUM 7.9 (L) 08/25/2014 1300   PROT 7.2 05/30/2016 0316   PROT 6.3 (L) 08/25/2014 1300   ALBUMIN 3.3 (L) 05/30/2016 0316   ALBUMIN 2.4 (L) 08/25/2014 1300   AST 66 (H) 05/30/2016 0316   AST 8 (L) 08/25/2014 1300   ALT 70 (H) 05/30/2016 0316   ALT 13 (L) 08/25/2014 1300   ALKPHOS 71 05/30/2016 0316   ALKPHOS 77 08/25/2014 1300   BILITOT 0.6 05/30/2016 0316   BILITOT 0.2 08/25/2014 1300   GFRNONAA >60 05/30/2016 0316   GFRNONAA >60 08/25/2014 1300   GFRNONAA >60 12/11/2012 0623   GFRAA >60 05/30/2016 0316   GFRAA >60 08/25/2014 1300   GFRAA >60 12/11/2012 0623   PT/INR No results for input(s): LABPROT, INR in the last 72 hours.  Studies/Results: No results  found.  Assessment/Plan: I will increase suction on her Pleur-evac and repeat her chest x-ray. She may need to consider having a larger tube to accomplish a more satisfactory reexpansion.

## 2016-06-03 ENCOUNTER — Telehealth: Payer: Self-pay | Admitting: Surgery

## 2016-06-03 DIAGNOSIS — N83201 Unspecified ovarian cyst, right side: Secondary | ICD-10-CM | POA: Diagnosis present

## 2016-06-03 DIAGNOSIS — K8065 Calculus of gallbladder and bile duct with chronic cholecystitis with obstruction: Secondary | ICD-10-CM | POA: Diagnosis present

## 2016-06-03 DIAGNOSIS — Z833 Family history of diabetes mellitus: Secondary | ICD-10-CM | POA: Diagnosis not present

## 2016-06-03 DIAGNOSIS — Z803 Family history of malignant neoplasm of breast: Secondary | ICD-10-CM | POA: Diagnosis not present

## 2016-06-03 DIAGNOSIS — E669 Obesity, unspecified: Secondary | ICD-10-CM | POA: Diagnosis present

## 2016-06-03 DIAGNOSIS — Z800 Family history of malignant neoplasm of digestive organs: Secondary | ICD-10-CM | POA: Diagnosis not present

## 2016-06-03 DIAGNOSIS — I10 Essential (primary) hypertension: Secondary | ICD-10-CM | POA: Diagnosis present

## 2016-06-03 DIAGNOSIS — Z823 Family history of stroke: Secondary | ICD-10-CM | POA: Diagnosis not present

## 2016-06-03 DIAGNOSIS — K819 Cholecystitis, unspecified: Secondary | ICD-10-CM | POA: Diagnosis present

## 2016-06-03 DIAGNOSIS — K8020 Calculus of gallbladder without cholecystitis without obstruction: Secondary | ICD-10-CM | POA: Diagnosis present

## 2016-06-03 DIAGNOSIS — N83202 Unspecified ovarian cyst, left side: Secondary | ICD-10-CM | POA: Diagnosis present

## 2016-06-03 DIAGNOSIS — Z87891 Personal history of nicotine dependence: Secondary | ICD-10-CM | POA: Diagnosis not present

## 2016-06-03 DIAGNOSIS — Z975 Presence of (intrauterine) contraceptive device: Secondary | ICD-10-CM | POA: Diagnosis not present

## 2016-06-03 DIAGNOSIS — E039 Hypothyroidism, unspecified: Secondary | ICD-10-CM | POA: Diagnosis present

## 2016-06-03 DIAGNOSIS — Z8249 Family history of ischemic heart disease and other diseases of the circulatory system: Secondary | ICD-10-CM | POA: Diagnosis not present

## 2016-06-03 LAB — CBC
HEMATOCRIT: 35.9 % (ref 35.0–47.0)
HEMOGLOBIN: 12.1 g/dL (ref 12.0–16.0)
MCH: 28.3 pg (ref 26.0–34.0)
MCHC: 33.6 g/dL (ref 32.0–36.0)
MCV: 84.3 fL (ref 80.0–100.0)
Platelets: 286 10*3/uL (ref 150–440)
RBC: 4.26 MIL/uL (ref 3.80–5.20)
RDW: 15.6 % — ABNORMAL HIGH (ref 11.5–14.5)
WBC: 6.8 10*3/uL (ref 3.6–11.0)

## 2016-06-03 LAB — CREATININE, SERUM
CREATININE: 0.61 mg/dL (ref 0.44–1.00)
GFR calc Af Amer: >60 mL/min (ref >60)
GFR calc non Af Amer: >60 mL/min (ref >60)

## 2016-06-03 MED ORDER — OXYCODONE-ACETAMINOPHEN 7.5-325 MG PO TABS: 2.0000 | ORAL_TABLET | ORAL | 0 refills | Status: DC | PRN

## 2016-06-03 NOTE — Discharge Summary (Signed)
Patient ID: Taylor RaymondRebecca A Weger MRN: [id number] DOB/AGE: 30/02/1986 30 y.o.  Admit date: 05/28/2016 Discharge date: 06/03/2016   Discharge Diagnoses:  Active Problems:   Abdominal pain   Cholecystitis   Procedures:lap chole  Hospital Course: 30 yo female admitted for abdominal pain and ovarian cyst concerning for cholecystitis. Initially admitted for antibiotic therapy but her pain worsened. She underwent an uneventful lap chole by Dr. Michela PitcherEly . Post operative the main issue was pain control, requiring parenteral narcotics. After a few days her condition improved and at the time of DC she was ambulating, pain was controlled and po narcotics, she was tolerating PO and her VSS. Her physical exam showed a female in NAD, alert, Abd: soft, incisions c/d/i, nylon stiches in place, no evidence of infection. Condition at the time of DC is stable She will f/u with her OB regarding the ovarian cysts.   Disposition: 01-Home or Self Care  Discharge Instructions    Call MD for:  difficulty breathing, headache or visual disturbances    Complete by:  As directed   Call MD for:  hives    Complete by:  As directed   Call MD for:  persistant dizziness or light-headedness    Complete by:  As directed   Call MD for:  persistant nausea and vomiting    Complete by:  As directed   Call MD for:  redness, tenderness, or signs of infection (pain, swelling, redness, odor or green/yellow discharge around incision site)    Complete by:  As directed   Call MD for:  severe uncontrolled pain    Complete by:  As directed   Call MD for:  temperature >100.4    Complete by:  As directed   Diet - low sodium heart healthy    Complete by:  As directed   Discharge instructions    Complete by:  As directed   Shower starting today   Increase activity slowly    Complete by:  As directed   Lifting restrictions    Complete by:  As directed   20 lbs x 6 weeks   No wound care    Complete by:  As directed       Medication List     TAKE these medications   ALPRAZolam 0.25 MG tablet Commonly known as:  XANAX Take 0.25 mg by mouth 2 (two) times daily as needed for anxiety.   ciprofloxacin 500 MG tablet Commonly known as:  CIPRO Take 1 tablet (500 mg total) by mouth 2 (two) times daily.   FLUoxetine 20 MG capsule Commonly known as:  PROZAC Take 1 capsule by mouth every morning.   HYDROcodone-acetaminophen 5-325 MG tablet Commonly known as:  NORCO/VICODIN Take 1-2 tablets by mouth every 6 (six) hours as needed for moderate pain.   ibuprofen 800 MG tablet Commonly known as:  ADVIL,MOTRIN Take 800 mg by mouth every 8 (eight) hours as needed for moderate pain.   levothyroxine 50 MCG tablet Commonly known as:  SYNTHROID, LEVOTHROID Take 1 tablet by mouth daily.   ondansetron 4 MG tablet Commonly known as:  ZOFRAN Take 1 tablet (4 mg total) by mouth every 8 (eight) hours as needed for nausea or vomiting.   oxyCODONE-acetaminophen 7.5-325 MG tablet Commonly known as:  PERCOCET Take 2 tablets by mouth every 4 (four) hours as needed for moderate pain or severe pain.      Follow-up Information    [facility] Schedule an appointment as soon as possible for  a visit in 1 week(s).   Why:  Call office to make the appointment, Or See [facility] Contact information: [address] [place] [phone]        [facility] Follow up in 1 week(s).            Sterling Bigiego Zuhayr Deeney, MD FACS

## 2016-06-03 NOTE — Progress Notes (Signed)
Pt discharged today, discharge instructions reviewed she verified understanding. 1 prescription given to patient. Pt belongings packed and given to patient. Pt rolled out in wheelchair by staff.

## 2016-06-03 NOTE — Telephone Encounter (Signed)
Called patient back in reference to her disability forms. She stated that I would be receiving a form from [employer] and [address]. Patient also stated that she would be out of work for the next 6 weeks since she is just getting discharged. I told her that I would put that on her forms. Patient had no further questions.

## 2016-06-03 NOTE — Telephone Encounter (Signed)
Patient would like to talk to you regarding FMLA paperwork that's going to be sent over. I already gave her the address her

## 2016-06-04 NOTE — Telephone Encounter (Signed)
Patient's disability Forms ([employer]) have been faxed to our office.

## 2016-06-05 ENCOUNTER — Telehealth: Payer: Self-pay

## 2016-06-05 DIAGNOSIS — N390 Urinary tract infection, site not specified: Secondary | ICD-10-CM | POA: Diagnosis not present

## 2016-06-05 NOTE — Telephone Encounter (Signed)
Patient's FMLA from [employer] forms were filled out and faxed.

## 2016-06-06 ENCOUNTER — Telehealth: Payer: Self-pay

## 2016-06-06 NOTE — Telephone Encounter (Signed)
-----   Message from Leafy Roiego F Pabon, MD sent at 06/05/2016  3:21 PM EDT ----- Regarding: RE: Question I said that she could not lift more than 20 lbs, she said that she lifts and pushes heavy equipment.. And that then she could not work.... I would just say that her restriction is lifting 20 lbs and pushing heavy loads and leave it up to her employer to decide if she needs to be out of work or not ----- Message ----- From: Adela PortsMaritza Dejohn Ibarra, CMA Sent: 06/05/2016   2:23 PM To: Leafy Roiego F Pabon, MD Subject: Question                                       Quick question. Did you tell patient that she wasn't allowed to go back to work for the next 6 weeks? I'm trying to fill out her disability form and she called me today stating that she wasn't allowed to return to work for the next 6 weeks.  I tried looking at your discharge summary from 06/03/2016 and did not see anything related to amount to be off from work.  We usually give patients 2 weeks off after a laparoscopic cholecystectomy. I know that this patient had some additional problems while she was in the hospital. Can you please help me.

## 2016-06-06 NOTE — Telephone Encounter (Signed)
Please read the message above.  After filling out patient's disability form, I received a fax back from [employer] stating that patient is requesting leave until 06/27/2016. I replied back to the form that patient's return to work date will be determined on her post-operation appointment on 06/12/2016 with Dr. Everlene FarrierPabon.

## 2016-06-06 NOTE — Telephone Encounter (Signed)
Diego Ronnette JuniperF Pabon, MD  Adela PortsMaritza Kadon Andrus, CMA        That is right. Employer needs to determine if she can work or not. I do not know the extent of her work description.   Previous Messages    ----- Message -----  From: Adela PortsMaritza Maurica Omura, CMA  Sent: 06/05/2016  3:37 PM  To: Leafy Roiego F Pabon, MD  Subject: RE: Question                   Just like every other patient, right? We want to prevent a hernia and therefore her lifting restrictions will be for 6 weeks. But, could return to work with restrictions in two weeks since she was discharged on 06/03/2016.  I'm sorry. I don't mean to bother you. But this needed to be clarified before I finished filling out her disability form.  Thank you for your help.

## 2016-06-10 ENCOUNTER — Encounter: Payer: Self-pay | Admitting: Surgery

## 2016-06-12 ENCOUNTER — Ambulatory Visit (INDEPENDENT_AMBULATORY_CARE_PROVIDER_SITE_OTHER): Payer: 59 | Admitting: Surgery

## 2016-06-12 ENCOUNTER — Encounter: Payer: Self-pay | Admitting: Surgery

## 2016-06-12 VITALS — BP 137/92 | HR 74 | Temp 98.5°F | Ht 66.0 in | Wt 333.5 lb

## 2016-06-12 DIAGNOSIS — Z09 Encounter for follow-up examination after completed treatment for conditions other than malignant neoplasm: Secondary | ICD-10-CM

## 2016-06-12 NOTE — Patient Instructions (Signed)
You may return to work next Monday. See the work note provided.  Please call our office with any questions or concerns.  Please do not submerge in a tub, hot tub, or pool until incisions are completely sealed and steri-strips have fallen off. This will take between 7-10 days.  Use sun block to incision area over the next year if this area will be exposed to sun. This helps decrease scarring.  You may now resume your normal activities at home up to 15 lbs. Listen to your body when lifting under than 15lbs, if you have pain when lifting, stop and then try again in a few days.  If you develop redness, drainage, or pain at incision sites- call our office immediately and speak with a nurse.

## 2016-06-12 NOTE — Progress Notes (Signed)
S/p lap chole 8/16 by Tommi Rumpse. Taylor [name], had some pain issues post op, doing better Taking PO, ambulating , no fevers  PE NAD Abd: soft, Incisions c/d/i, no infection  A/P doing well Path d/w pt in detail No heavy lifting for total of 6 weeks after surgery May return to work , light duty

## 2016-06-19 DIAGNOSIS — R102 Pelvic and perineal pain: Secondary | ICD-10-CM | POA: Diagnosis not present

## 2016-06-20 ENCOUNTER — Telehealth: Payer: Self-pay

## 2016-06-20 NOTE — Telephone Encounter (Signed)
Matrix Form was filled out and faxed.

## 2016-07-11 DIAGNOSIS — Z01419 Encounter for gynecological examination (general) (routine) without abnormal findings: Secondary | ICD-10-CM | POA: Diagnosis not present

## 2016-07-11 DIAGNOSIS — Z6841 Body Mass Index (BMI) 40.0 and over, adult: Secondary | ICD-10-CM | POA: Diagnosis not present

## 2016-07-11 DIAGNOSIS — N83201 Unspecified ovarian cyst, right side: Secondary | ICD-10-CM | POA: Diagnosis not present

## 2016-07-25 NOTE — Patient Instructions (Addendum)
Your procedure is scheduled on:  Tuesday, Oct. 24, 2017  Enter through the Main Entrance of [facility] at:  6:00 AM  Pick up the phone at the desk and dial [phone].  Call this number if you have problems the morning of surgery: [phone].  Remember: Do NOT eat food or drink after:  Midnight Monday, Oct. 23, 2017  Take these medicines the morning of surgery with a SIP OF WATER:  Prozac, Synthroid, Xanax if needed  Stop ALL herbal medications at this time   Do NOT wear jewelry (body piercing), metal hair clips/bobby pins, make-up, or nail polish. Do NOT wear lotions, powders, or perfumes.  You may wear deodorant. Do NOT shave for 48 hours prior to surgery. Do NOT bring valuables to the hospital. Contacts, dentures, or bridgework may not be worn into surgery.  Have a responsible adult drive you home and stay with you for 24 hours after your procedure

## 2016-07-25 NOTE — H&P (Signed)
30 year old female with pelvic pain and right ovarian cyst. She desires drainage of right ovarian cyst.  Past Medical History:  Diagnosis Date  . H/O back injury    broken back age 30  . Hypertension   . Hypothyroidism    Past Surgical History:  Procedure Laterality Date  . CESAREAN SECTION N/A 11/22/2014   Procedure: CESAREAN SECTION;  Surgeon: Turner Danielsavid C Lowe, MD;  Location: [facility] ORS;  Service: Obstetrics;  Laterality: N/A;  . CHOLECYSTECTOMY N/A 05/29/2016   Procedure: LAPAROSCOPIC CHOLECYSTECTOMY;  Surgeon: Tiney Rougealph Ely III, MD;  Location: [facility] ORS;  Service: General;  Laterality: N/A;  . WISDOM TOOTH EXTRACTION     Allergies:  Keflex  Review of Systems  All other systems reviewed and are negative.   Family History: Unremarkable  There were no vitals taken for this visit. No results found for this or any previous visit (from the past 24 hour(s)). General alert and oriented Lung CTAB Car RRR Abdomen is soft and non tender  IMPRESSION: Right ovarian cyst Pelvic Pain  PLAN: Laparoscopy Drainage of right ovarian cyst Consent signed Risks discussed

## 2016-07-29 ENCOUNTER — Encounter (HOSPITAL_COMMUNITY)
Admission: RE | Admit: 2016-07-29 | Discharge: 2016-07-29 | Disposition: A | Discharge status: Home / Self Care | Payer: 59 | Source: Ambulatory Visit | Attending: Obstetrics and Gynecology | Admitting: Obstetrics and Gynecology

## 2016-07-29 ENCOUNTER — Encounter (HOSPITAL_COMMUNITY): Payer: Self-pay

## 2016-07-29 DIAGNOSIS — Z01812 Encounter for preprocedural laboratory examination: Secondary | ICD-10-CM | POA: Diagnosis not present

## 2016-07-29 HISTORY — DX: Depression, unspecified: F32.A

## 2016-07-29 HISTORY — DX: Adverse effect of unspecified anesthetic, initial encounter: T41.45XA

## 2016-07-29 HISTORY — DX: Other complications of anesthesia, initial encounter: T88.59XA

## 2016-07-29 HISTORY — DX: Major depressive disorder, single episode, unspecified: F32.9

## 2016-07-29 HISTORY — DX: Anxiety disorder, unspecified: F41.9

## 2016-07-29 LAB — CBC
HEMATOCRIT: 37.6 % (ref 36.0–46.0)
HEMOGLOBIN: 12.3 g/dL (ref 12.0–15.0)
MCH: 28.2 pg (ref 26.0–34.0)
MCHC: 32.7 g/dL (ref 30.0–36.0)
MCV: 86.2 fL (ref 78.0–100.0)
Platelets: 308 10*3/uL (ref 150–400)
RBC: 4.36 MIL/uL (ref 3.87–5.11)
RDW: 14.7 % (ref 11.5–15.5)
WBC: 8.4 10*3/uL (ref 4.0–10.5)

## 2016-08-05 MED ORDER — GENTAMICIN SULFATE 40 MG/ML IJ SOLN
INTRAVENOUS | Status: AC
  Administered 2016-08-06: 118.25 mL via INTRAVENOUS
  Filled 2016-08-05: qty 12.25

## 2016-08-05 NOTE — Anesthesia Preprocedure Evaluation (Signed)
Anesthesia Evaluation  Patient identified by MRN, date of birth, ID band Patient awake    Reviewed: Allergy & Precautions, NPO status , Patient's Chart, lab work & pertinent test results  History of Anesthesia Complications Negative for: history of anesthetic complications  Airway Mallampati: II  TM Distance: >3 FB Neck ROM: Full    Dental no notable dental hx.    Pulmonary neg COPD, former smoker,    breath sounds clear to auscultation- rhonchi (-) wheezing      Cardiovascular Exercise Tolerance: Good hypertension, (-) CAD and (-) Past MI  Rhythm:Regular Rate:Normal - Systolic murmurs and - Diastolic murmurs    Neuro/Psych PSYCHIATRIC DISORDERS Anxiety Depression negative neurological ROS     GI/Hepatic negative GI ROS, Neg liver ROS,   Endo/Other  neg diabetesHypothyroidism Morbid obesity  Renal/GU negative Renal ROS     Musculoskeletal negative musculoskeletal ROS (+)   Abdominal (+) + obese,   Peds  Hematology negative hematology ROS (+)   Anesthesia Other Findings   Reproductive/Obstetrics                             Anesthesia Physical Anesthesia Plan  ASA: IV  Anesthesia Plan: General   Post-op Pain Management:    Induction: Intravenous  Airway Management Planned: Oral ETT  Additional Equipment:   Intra-op Plan:   Post-operative Plan: Extubation in OR  Informed Consent: I have reviewed the patients History and Physical, chart, labs and discussed the procedure including the risks, benefits and alternatives for the proposed anesthesia with the patient or authorized representative who has indicated his/her understanding and acceptance.   Dental advisory given  Plan Discussed with: CRNA  Anesthesia Plan Comments:        Anesthesia Quick Evaluation

## 2016-08-06 ENCOUNTER — Ambulatory Visit (HOSPITAL_COMMUNITY): Payer: 59 | Admitting: Anesthesiology

## 2016-08-06 ENCOUNTER — Encounter (HOSPITAL_COMMUNITY)
Admission: RE | Disposition: A | Discharge status: Home / Self Care | Payer: Self-pay | Source: Ambulatory Visit | Attending: Obstetrics and Gynecology

## 2016-08-06 ENCOUNTER — Observation Stay (HOSPITAL_COMMUNITY)
Admission: RE | Admit: 2016-08-06 | Discharge: 2016-08-06 | Disposition: A | Discharge status: Home / Self Care | Payer: 59 | Source: Ambulatory Visit | Attending: Obstetrics and Gynecology | Admitting: Obstetrics and Gynecology

## 2016-08-06 ENCOUNTER — Encounter (HOSPITAL_COMMUNITY): Payer: Self-pay

## 2016-08-06 DIAGNOSIS — R102 Pelvic and perineal pain: Secondary | ICD-10-CM | POA: Diagnosis not present

## 2016-08-06 DIAGNOSIS — Z87891 Personal history of nicotine dependence: Secondary | ICD-10-CM | POA: Diagnosis not present

## 2016-08-06 DIAGNOSIS — N83202 Unspecified ovarian cyst, left side: Secondary | ICD-10-CM | POA: Diagnosis not present

## 2016-08-06 DIAGNOSIS — I10 Essential (primary) hypertension: Secondary | ICD-10-CM | POA: Diagnosis not present

## 2016-08-06 DIAGNOSIS — I493 Ventricular premature depolarization: Secondary | ICD-10-CM | POA: Diagnosis present

## 2016-08-06 HISTORY — PX: LAPAROSCOPY: SHX197

## 2016-08-06 LAB — PREGNANCY, URINE: PREG TEST UR: NEGATIVE

## 2016-08-06 SURGERY — LAPAROSCOPY, DIAGNOSTIC
Anesthesia: General | Laterality: Left

## 2016-08-06 MED ORDER — LACTATED RINGERS IV SOLN: INTRAVENOUS | Status: DC

## 2016-08-06 MED ORDER — SUCCINYLCHOLINE CHLORIDE 20 MG/ML IJ SOLN
INTRAMUSCULAR | Status: DC | PRN
  Administered 2016-08-06: 100 mg via INTRAVENOUS

## 2016-08-06 MED ORDER — LACTATED RINGERS IV SOLN
INTRAVENOUS | Status: DC
  Administered 2016-08-06: 11:00:00 via INTRAVENOUS

## 2016-08-06 MED ORDER — KETOROLAC TROMETHAMINE 30 MG/ML IJ SOLN
INTRAMUSCULAR | Status: AC
  Filled 2016-08-06: qty 1

## 2016-08-06 MED ORDER — DEXAMETHASONE SODIUM PHOSPHATE 4 MG/ML IJ SOLN
INTRAMUSCULAR | Status: DC | PRN
  Administered 2016-08-06 (×2): 5 mg via INTRAVENOUS

## 2016-08-06 MED ORDER — IBUPROFEN 600 MG PO TABS: 600.0000 mg | ORAL_TABLET | Freq: Four times a day (QID) | ORAL | Status: DC | PRN

## 2016-08-06 MED ORDER — BUPIVACAINE HCL (PF) 0.25 % IJ SOLN
INTRAMUSCULAR | Status: AC
  Filled 2016-08-06: qty 30

## 2016-08-06 MED ORDER — PROPOFOL 10 MG/ML IV BOLUS
INTRAVENOUS | Status: AC
  Filled 2016-08-06: qty 20

## 2016-08-06 MED ORDER — FENTANYL CITRATE (PF) 100 MCG/2ML IJ SOLN
INTRAMUSCULAR | Status: DC | PRN
  Administered 2016-08-06 (×3): 50 ug via INTRAVENOUS
  Administered 2016-08-06: 100 ug via INTRAVENOUS

## 2016-08-06 MED ORDER — GLYCOPYRROLATE 0.2 MG/ML IJ SOLN
INTRAMUSCULAR | Status: DC | PRN
  Administered 2016-08-06: 0.1 mg via INTRAVENOUS

## 2016-08-06 MED ORDER — SUCCINYLCHOLINE CHLORIDE 200 MG/10ML IV SOSY
PREFILLED_SYRINGE | INTRAVENOUS | Status: AC
  Filled 2016-08-06: qty 10

## 2016-08-06 MED ORDER — SUGAMMADEX SODIUM 200 MG/2ML IV SOLN
INTRAVENOUS | Status: AC
  Filled 2016-08-06: qty 2

## 2016-08-06 MED ORDER — BUPIVACAINE HCL (PF) 0.25 % IJ SOLN
INTRAMUSCULAR | Status: DC | PRN
  Administered 2016-08-06: 4 mL

## 2016-08-06 MED ORDER — HYDROMORPHONE HCL 1 MG/ML IJ SOLN
INTRAMUSCULAR | Status: DC | PRN
  Administered 2016-08-06: 1 mg via INTRAVENOUS

## 2016-08-06 MED ORDER — PROMETHAZINE HCL 25 MG/ML IJ SOLN: 6.2500 mg | INTRAMUSCULAR | Status: DC | PRN

## 2016-08-06 MED ORDER — MIDAZOLAM HCL 2 MG/2ML IJ SOLN
INTRAMUSCULAR | Status: DC | PRN
  Administered 2016-08-06: 1 mg via INTRAVENOUS

## 2016-08-06 MED ORDER — ONDANSETRON HCL 4 MG/2ML IJ SOLN
INTRAMUSCULAR | Status: DC | PRN
  Administered 2016-08-06: 4 mg via INTRAVENOUS

## 2016-08-06 MED ORDER — SCOPOLAMINE 1 MG/3DAYS TD PT72
MEDICATED_PATCH | TRANSDERMAL | Status: AC
  Administered 2016-08-06: 1.5 mg via TRANSDERMAL
  Filled 2016-08-06: qty 1

## 2016-08-06 MED ORDER — SUGAMMADEX SODIUM 500 MG/5ML IV SOLN
INTRAVENOUS | Status: AC
  Filled 2016-08-06: qty 5

## 2016-08-06 MED ORDER — GLYCOPYRROLATE 0.2 MG/ML IJ SOLN
INTRAMUSCULAR | Status: AC
  Filled 2016-08-06: qty 1

## 2016-08-06 MED ORDER — SUGAMMADEX SODIUM 200 MG/2ML IV SOLN
INTRAVENOUS | Status: DC | PRN
  Administered 2016-08-06: 2000 mg via INTRAVENOUS

## 2016-08-06 MED ORDER — SCOPOLAMINE 1 MG/3DAYS TD PT72
1.0000 | MEDICATED_PATCH | Freq: Once | TRANSDERMAL | Status: DC
Start: 2016-08-06 — End: 2016-08-06
  Administered 2016-08-06: 1.5 mg via TRANSDERMAL

## 2016-08-06 MED ORDER — LIDOCAINE HCL (CARDIAC) 20 MG/ML IV SOLN
INTRAVENOUS | Status: AC
  Filled 2016-08-06: qty 5

## 2016-08-06 MED ORDER — MENTHOL 3 MG MT LOZG: 1.0000 | LOZENGE | OROMUCOSAL | Status: DC | PRN

## 2016-08-06 MED ORDER — NEOSTIGMINE METHYLSULFATE 10 MG/10ML IV SOLN
INTRAVENOUS | Status: AC
  Filled 2016-08-06: qty 1

## 2016-08-06 MED ORDER — KETOROLAC TROMETHAMINE 30 MG/ML IJ SOLN
30.0000 mg | Freq: Once | INTRAMUSCULAR | Status: AC
  Administered 2016-08-06: 30 mg via INTRAVENOUS

## 2016-08-06 MED ORDER — KETOROLAC TROMETHAMINE 30 MG/ML IJ SOLN: 30.0000 mg | Freq: Once | INTRAMUSCULAR | Status: DC

## 2016-08-06 MED ORDER — FENTANYL CITRATE (PF) 250 MCG/5ML IJ SOLN
INTRAMUSCULAR | Status: AC
  Filled 2016-08-06: qty 5

## 2016-08-06 MED ORDER — SODIUM CHLORIDE 0.9 % IJ SOLN
INTRAMUSCULAR | Status: AC
  Filled 2016-08-06: qty 10

## 2016-08-06 MED ORDER — SUGAMMADEX SODIUM 200 MG/2ML IV SOLN
INTRAVENOUS | Status: AC
Start: 2016-08-06 — End: 2016-08-06
  Filled 2016-08-06: qty 4

## 2016-08-06 MED ORDER — TRAMADOL HCL 50 MG PO TABS: 50.0000 mg | ORAL_TABLET | Freq: Four times a day (QID) | ORAL | Status: DC | PRN

## 2016-08-06 MED ORDER — LACTATED RINGERS IV SOLN
INTRAVENOUS | Status: DC
  Administered 2016-08-06 (×2): via INTRAVENOUS

## 2016-08-06 MED ORDER — HYDROMORPHONE HCL 1 MG/ML IJ SOLN
0.2500 mg | INTRAMUSCULAR | Status: DC | PRN
  Administered 2016-08-06 (×2): 0.5 mg via INTRAVENOUS

## 2016-08-06 MED ORDER — ROCURONIUM BROMIDE 100 MG/10ML IV SOLN
INTRAVENOUS | Status: AC
  Filled 2016-08-06: qty 1

## 2016-08-06 MED ORDER — HYDROMORPHONE HCL 1 MG/ML IJ SOLN
INTRAMUSCULAR | Status: AC
  Administered 2016-08-06: 0.5 mg via INTRAVENOUS
  Filled 2016-08-06: qty 1

## 2016-08-06 MED ORDER — PROPOFOL 10 MG/ML IV BOLUS
INTRAVENOUS | Status: DC | PRN
  Administered 2016-08-06: 200 mg via INTRAVENOUS
  Administered 2016-08-06: 50 mg via INTRAVENOUS

## 2016-08-06 MED ORDER — ROCURONIUM BROMIDE 100 MG/10ML IV SOLN
INTRAVENOUS | Status: DC | PRN
  Administered 2016-08-06: 50 mg via INTRAVENOUS

## 2016-08-06 MED ORDER — SODIUM CHLORIDE 0.9 % IJ SOLN
INTRAMUSCULAR | Status: DC | PRN
  Administered 2016-08-06: 10 mL

## 2016-08-06 MED ORDER — DEXAMETHASONE SODIUM PHOSPHATE 10 MG/ML IJ SOLN
INTRAMUSCULAR | Status: AC
  Filled 2016-08-06: qty 1

## 2016-08-06 MED ORDER — HYDROMORPHONE HCL 1 MG/ML IJ SOLN
INTRAMUSCULAR | Status: AC
  Filled 2016-08-06: qty 1

## 2016-08-06 MED ORDER — LIDOCAINE HCL (CARDIAC) 20 MG/ML IV SOLN
INTRAVENOUS | Status: DC | PRN
  Administered 2016-08-06: 100 mg via INTRAVENOUS

## 2016-08-06 MED ORDER — MIDAZOLAM HCL 2 MG/2ML IJ SOLN
INTRAMUSCULAR | Status: AC
Start: 2016-08-06 — End: 2016-08-06
  Filled 2016-08-06: qty 2

## 2016-08-06 MED ORDER — ONDANSETRON HCL 4 MG/2ML IJ SOLN
INTRAMUSCULAR | Status: AC
  Filled 2016-08-06: qty 2

## 2016-08-06 MED ORDER — OXYCODONE-ACETAMINOPHEN 5-325 MG PO TABS: 1.0000 | ORAL_TABLET | ORAL | Status: DC | PRN

## 2016-08-06 MED ORDER — MEPERIDINE HCL 25 MG/ML IJ SOLN: 6.2500 mg | INTRAMUSCULAR | Status: DC | PRN

## 2016-08-06 SURGICAL SUPPLY — 33 items
CABLE HIGH FREQUENCY MONO STRZ (ELECTRODE) IMPLANT
CATH ROBINSON RED A/P 16FR (CATHETERS) ×3 IMPLANT
CLOTH BEACON ORANGE TIMEOUT ST (SAFETY) ×3 IMPLANT
DRSG OPSITE POSTOP 3X4 (GAUZE/BANDAGES/DRESSINGS) IMPLANT
DURAPREP 26ML APPLICATOR (WOUND CARE) ×3 IMPLANT
FILTER SMOKE EVAC LAPAROSHD (FILTER) IMPLANT
GLOVE BIO SURGEON STRL SZ 6.5 (GLOVE) ×2 IMPLANT
GLOVE BIO SURGEONS STRL SZ 6.5 (GLOVE) ×1
GLOVE BIOGEL PI IND STRL 7.0 (GLOVE) ×1 IMPLANT
GLOVE BIOGEL PI INDICATOR 7.0 (GLOVE) ×2
GOWN STRL REUS W/TWL LRG LVL3 (GOWN DISPOSABLE) ×6 IMPLANT
LIQUID BAND (GAUZE/BANDAGES/DRESSINGS) ×3 IMPLANT
NEEDLE INSUFFLATION 120MM (ENDOMECHANICALS) ×3 IMPLANT
NS IRRIG 1000ML POUR BTL (IV SOLUTION) ×3 IMPLANT
PACK LAPAROSCOPY BASIN (CUSTOM PROCEDURE TRAY) ×3 IMPLANT
PACK TRENDGUARD 450 HYBRID PRO (MISCELLANEOUS) IMPLANT
PACK TRENDGUARD 600 HYBRD PROC (MISCELLANEOUS) IMPLANT
PROTECTOR NERVE ULNAR (MISCELLANEOUS) ×6 IMPLANT
RETRACTOR TRAXI PANNICULUS (MISCELLANEOUS) IMPLANT
SEALER TISSUE G2 CVD JAW 45CM (ENDOMECHANICALS) ×2 IMPLANT
SET IRRIG TUBING LAPAROSCOPIC (IRRIGATION / IRRIGATOR) IMPLANT
SLEEVE XCEL OPT CAN 5 100 (ENDOMECHANICALS) ×1 IMPLANT
SUT VIC AB 3-0 PS2 18 (SUTURE) ×3
SUT VIC AB 3-0 PS2 18XBRD (SUTURE) IMPLANT
SUT VICRYL 0 UR6 27IN ABS (SUTURE) ×2 IMPLANT
TOWEL OR 17X24 6PK STRL BLUE (TOWEL DISPOSABLE) ×6 IMPLANT
TRAXI PANNICULUS RETRACTOR (MISCELLANEOUS) ×2
TRENDGUARD 450 HYBRID PRO PACK (MISCELLANEOUS)
TRENDGUARD 600 HYBRID PROC PK (MISCELLANEOUS) ×3
TROCAR OPTI TIP 5M 100M (ENDOMECHANICALS) ×3 IMPLANT
TROCAR XCEL DIL TIP R 11M (ENDOMECHANICALS) ×3 IMPLANT
WARMER LAPAROSCOPE (MISCELLANEOUS) ×3 IMPLANT
WATER STERILE IRR 1000ML POUR (IV SOLUTION) ×3 IMPLANT

## 2016-08-06 NOTE — Discharge Summary (Signed)
Admission Diagnosis: Status post LSC left ovarian cystectomy PVCs  Discharge Diagnosis: Same:  Hospital Course: 30 year old female with ovarian cyst admitted and underwent LSC left ovarian cystectomy.  After surgery, she had intermittent ASYMPTOMATIC PVC's. Anesthesia initially recommend overnight telemetry and Cardiology consultation.  I called [facility]. They did not feel that overnight telemetry was necessary. If patient is asymptomatic, then they recommend outpatient ECHO and Holter monitor which they are going to arrange.

## 2016-08-06 NOTE — Brief Op Note (Signed)
08/06/2016  8:40 AM  PATIENT:  Taylor [name]  30 y.o. female  PRE-OPERATIVE DIAGNOSIS:   Right lower quadrant pain Right ovarian cyst  POST-OPERATIVE DIAGNOSIS:  Right lower quadrant pain Left ovarian cyst  PROCEDURE:  Procedure(s): LAPAROSCOPY DIAGNOSTIC with drainage of left ovarian cyst (Left)  SURGEON:  Surgeon(s) and Role:    * Marcelle OverlieMichelle Genevive Printup, MD - Primary  PHYSICIAN ASSISTANT:   ASSISTANTS: Christ KickAndrew Pickens, Medical student [employer]   ANESTHESIA:   general  EBL:  Total I/O In: 1000 [I.V.:1000] Out: 100 [Urine:100]  BLOOD ADMINISTERED:none  DRAINS: none   LOCAL MEDICATIONS USED:  LIDOCAINE   SPECIMEN:  No Specimen  DISPOSITION OF SPECIMEN:  N/A  COUNTS:  YES  TOURNIQUET:  * No tourniquets in log *  DICTATION: .Other Dictation: Dictation Number [id number]  PLAN OF CARE: Discharge to home after PACU  PATIENT DISPOSITION:  PACU - hemodynamically stable.   Delay start of Pharmacological VTE agent (>24hrs) due to surgical blood loss or risk of bleeding: not applicable

## 2016-08-06 NOTE — Anesthesia Postprocedure Evaluation (Signed)
Anesthesia Post Note  Patient: Taylor RaymondRebecca A [name]  Procedure(s) Performed: Procedure(s) (LRB): LAPAROSCOPY DIAGNOSTIC with drainage of left ovarian cyst (Left)  Patient location during evaluation: A-ICU Anesthesia Type: General Level of consciousness: awake and alert Pain management: pain level controlled Vital Signs Assessment: post-procedure vital signs reviewed and stable Respiratory status: spontaneous breathing and nonlabored ventilation Cardiovascular status: stable Postop Assessment: adequate PO intake and no signs of nausea or vomiting Anesthetic complications: no     Last Vitals:  Vitals:   08/06/16 1500 08/06/16 1546  BP:    Pulse: 76   Resp: 13   Temp:  36.7 C    Last Pain:  Vitals:   08/06/16 1546  TempSrc: Oral  PainSc:    Pain Goal: Patients Stated Pain Goal: 3 (08/06/16 0606)               Laban EmperorMalinova,[name]

## 2016-08-06 NOTE — Progress Notes (Signed)
Received phone call from anesthesiologist Patient has had intermittent runs of PVCs since surgery No history of this Recommend observation on telemetry overnight Will obtain cardiology consultation

## 2016-08-06 NOTE — Progress Notes (Signed)
Patient still complains of RLQ pain.  H and p on the chart No changes Will proceed with Laparoscopy and drainage of right ovarian cyst Consent signed

## 2016-08-06 NOTE — Anesthesia Procedure Notes (Signed)
Procedure Name: Intubation Date/Time: 08/06/2016 7:52 AM Performed by: Graciela HusbandsFUSSELL, Marianne Golightly O Pre-anesthesia Checklist: Emergency Drugs available, Patient identified, Suction available, Patient being monitored and Timeout performed Patient Re-evaluated:Patient Re-evaluated prior to inductionOxygen Delivery Method: Nasal cannula Preoxygenation: Pre-oxygenation with 100% oxygen Intubation Type: IV induction Ventilation: Mask ventilation without difficulty Laryngoscope Size: Glidescope and 3 Grade View: Grade I Tube size: 7.0 mm Number of attempts: 1 Airway Equipment and Method: Stylet and Video-laryngoscopy Placement Confirmation: ETT inserted through vocal cords under direct vision,  positive ETCO2 and breath sounds checked- equal and bilateral Secured at: 21 cm Tube secured with: Tape Dental Injury: Teeth and Oropharynx as per pre-operative assessment

## 2016-08-06 NOTE — Transfer of Care (Signed)
Immediate Anesthesia Transfer of Care Note  Patient: Taylor [name]  Procedure(s) Performed: Procedure(s): LAPAROSCOPY DIAGNOSTIC with drainage of left ovarian cyst (Left)  Patient Location: PACU  Anesthesia Type:General  Level of Consciousness: awake, alert  and oriented  Airway & Oxygen Therapy: Patient Spontanous Breathing and Patient connected to nasal cannula oxygen  Post-op Assessment: Report given to RN and Post -op Vital signs reviewed and stable  Post vital signs: Reviewed and stable  Last Vitals:  Vitals:   08/06/16 0606  BP: 133/80  Pulse: 78  Resp: (!) 22  Temp: 36.7 C    Last Pain:  Vitals:   08/06/16 0606  TempSrc: Oral      Patients Stated Pain Goal: 3 (08/06/16 0606)  Complications: No apparent anesthesia complications

## 2016-08-06 NOTE — Progress Notes (Signed)
Discharge instructions reviewed with patient. Verbalized an understanding of instructions. Discharged via wheelchair with mother.

## 2016-08-07 ENCOUNTER — Encounter (HOSPITAL_COMMUNITY): Payer: Self-pay | Admitting: Obstetrics and Gynecology

## 2016-08-07 NOTE — Op Note (Signed)
NAMEarlie Lou:  Logie, Kayleeann             ACCOUNT NO.:  [id number]  MEDICAL RECORD NO.:  [id number]  LOCATION:  9373                          FACILITY:  [facility]  PHYSICIAN:  Geovonni Meyerhoff L. Sye Schroepfer, M.D.DATE OF BIRTH:  1986-07-01  DATE OF PROCEDURE:  08/06/2016 DATE OF DISCHARGE:  08/06/2016                              OPERATIVE REPORT   PREOPERATIVE DIAGNOSES:  Right lower quadrant pain and right ovarian cyst.  POSTOPERATIVE DIAGNOSES:  Right lower quadrant pain and left ovarian cyst.  SURGEON:  Risa Auman L. Vincente PoliGrewal, M.D.  ASSISTANT:  Christ KickAndrew Pickens, [employer] medical [facility].  ANESTHESIA:  General.  ESTIMATED BLOOD LOSS:  Minimal.  COMPLICATIONS:  None.  PROCEDURE DESCRIPTION:  The patient was taken to the operating room. She was intubated, she was carefully placed in the supine position.  We did have to use bariatric stirrups because of her BMI.  She was prepped and draped.  The uterine manipulator was inserted.  After time-out was performed.  An in and out catheter was used to empty the bladder.  We did use a pannus retractor, which helped immensely because of her large BMI.  A small infraumbilical incision was made at the area of her previous gallbladder scar.  The Veress needle was inserted one time. Pneumoperitoneum was performed.  Intraperitoneal placement was confirmed with a saline drop test.  After insufflation was performed, we removed the Veress needle.  An 11 mm trocar was inserted one time.  This scope was introduced through the trocar sheath and the area was inspected, no area of bleeding or intestinal injury was noted.  The patient was placed in Trendelenburg position for short period of time.  The uterus was small and normal in appearance.  No endometriosis and no adhesions were noted.  The right ovary and tube were completely normal.  No ovarian cyst was noted.  There was a cyst on her left ovary, which was approximately about 3 cm in diameter.  It had a smooth wall appeared  to be a simple appearing cyst.  This is on her left ovary, not her right. I used the EnSeal, placed through the operative laparoscope and drained the cyst.  After the cyst was completely drained, there was no bleeding noted.  The scope was removed.  The pneumoperitoneum was released as much as possible.  The trocar was removed.  A small stitch with 3-0 Vicryl was used to reapproximate the skin.  Dermabond was applied.  All instruments were removed from the vagina.  All sponge, lap, and instrument counts were correct x2.  The patient went to recovery room in stable condition.     Senon Nixon L. Vincente PoliGrewal, M.D.     Florestine AversMLG/MEDQ  D:  08/06/2016  T:  08/07/2016  Job:  [id number]

## 2016-08-07 NOTE — Progress Notes (Signed)
Cardiology Office Note   Date:  08/08/2016   ID:  Taylor [name], DOB 08/06/1986, MRN [id number]  PCP:  Philemon KingdomPROCHNAU,CAROLINE, Taylor [name]  Cardiologist:   Chilton Siiffany [place], Taylor [name]   Chief Complaint  Patient presents with  . New Patient (Initial Visit)    Pt states no Sx.       History of Present Illness: Taylor [name] is a 30 y.o. female with hypertension and hypothyroidism who presents for an evaluation of PVCs.  Taylor [name] underwent a laproscopic ovarian cystectomy on 08/06/16 and was noted to have asymptomatic PVCs.  She was referred for cardiology for further evaluation.  She reports that She has been feeling well. She denies any palpitations, lightheadedness, dizziness, chest pain, shortness of breath, orthopnea or PND. She does endorse some mild lower extremity edema after she has been standing for a long time at work. However it always improves with elevation of her legs. 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 Therefore she walks around the hospital all day long and has no exertional symptoms. She is a little bit short of breath walking upstairs, but she attributes this to her weight and being out of shape.  Taylor [name] has a family history of premature coronary artery disease. Her father had stents placed at age 30.  she has a history of hypertension and was previously on hydrochlorothiazide prior to her pregnancy. However she reports that her blood pressure has been well-controlled since delivery.   she quit smoking in 2015. She does note that she drinks up to 6 Cokes daily.  She denies using over the counter cold or cough medications.    Past Medical History:  Diagnosis Date  . Anxiety   . Complication of anesthesia    Hard to put under anesthesia, combative when waking up  . Depression   . Essential hypertension 08/08/2016  . H/O back injury    broken back age 30  . Hypertension    with pregnancy  . Hypothyroidism     Past Surgical  History:  Procedure Laterality Date  . CESAREAN SECTION N/A 11/22/2014   Procedure: CESAREAN SECTION;  Surgeon: Turner Danielsavid C Lowe, Taylor [name];  Location: [facility]S;  Service: Obstetrics;  Laterality: N/A;  . CHOLECYSTECTOMY N/A 05/29/2016   Procedure: LAPAROSCOPIC CHOLECYSTECTOMY;  Surgeon: Tiney Rougealph Ely III, Taylor [name];  Location: [facility] ORS;  Service: General;  Laterality: N/A;  . LAPAROSCOPY Left 08/06/2016   Procedure: LAPAROSCOPY DIAGNOSTIC with drainage of left ovarian cyst;  Surgeon: Marcelle OverlieMichelle Grewal, Taylor [name];  Location: [facility] ORS;  Service: Gynecology;  Laterality: Left;  . WISDOM TOOTH EXTRACTION       Current Outpatient Prescriptions  Medication Sig Dispense Refill  . ALPRAZolam (XANAX) 0.25 MG tablet Take 0.25 mg by mouth 2 (two) times daily as needed for anxiety.    Marland Kitchen. FLUoxetine (PROZAC) 20 MG tablet Take 20 mg by mouth daily.    Marland Kitchen. levothyroxine (SYNTHROID, LEVOTHROID) 50 MCG tablet Take 1 tablet by mouth daily.  4  . oxyCODONE-acetaminophen (PERCOCET) 10-325 MG tablet Take 1-2 tablets by mouth every 4 (four) hours as needed for pain.    . carvedilol (COREG) 6.25 MG tablet Take 1 tablet (6.25 mg total) by mouth 2 (two) times daily. 60 tablet 3   No current facility-administered medications for this visit.     Allergies:   Keflex [cephalexin]     Social History:  The patient  reports that she quit smoking about 2 years ago. Her  smoking use included Cigarettes. She has a 10.00 pack-year smoking history. She has never used smokeless tobacco. She reports that she does not drink alcohol or use drugs.   Family History:  The patient's family history includes Atrial fibrillation in her maternal grandmother; CAD in her father; Cancer in her maternal grandmother and paternal grandmother; Diabetes in her maternal grandfather; Heart disease in her maternal grandmother; Hypertension in her father, maternal grandfather, maternal grandmother, mother, paternal grandfather, and paternal grandmother; Stroke in her maternal grandmother;  Valvular heart disease in her maternal grandmother.    ROS:  Please see the history of present illness.   Otherwise, review of systems are positive for none.   All other systems are reviewed and negative.    PHYSICAL EXAM: VS:  BP (!) 160/88   Pulse 69   Ht 5\' 7"  (1.702 m)   Wt (!) 160.1 kg (353 lb)   BMI 55.29 kg/m  , BMI Body mass index is 55.29 kg/m. GENERAL:  Well appearing HEENT:  Pupils equal round and reactive, fundi not visualized, oral mucosa unremarkable NECK:  No jugular venous distention, waveform within normal limits, carotid upstroke brisk and symmetric, no bruits, no thyromegaly LYMPHATICS:  No cervical adenopathy LUNGS:  Clear to auscultation bilaterally HEART:  Mostly regular with occasional ectopy. PMI not displaced or sustained,S1 and S2 within normal limits, no S3, no S4, no clicks, no rubs, no murmurs ABD:  Flat, positive bowel sounds normal in frequency in pitch, no bruits, no rebound, no guarding, no midline pulsatile mass, no hepatomegaly, no splenomegaly EXT:  2 plus pulses throughout, no edema, no cyanosis no clubbing SKIN:  No rashes no nodules NEURO:  Cranial nerves II through XII grossly intact, motor grossly intact throughout PSYCH:  Cognitively intact, oriented to person place and time    EKG:  EKG is ordered today. The ekg ordered today demonstratessinus rhythm. Rate 68 bpm. 2 PVCs.  cent Labs: 05/30/2016: ALT 70; BUN 7; Potassium 4.1; Sodium 137 06/03/2016: Creatinine, Ser 0.61 07/29/2016: Hemoglobin 12.3; Platelets 308    Lipid Panel No results found for: CHOL, TRIG, HDL, CHOLHDL, VLDL, LDLCALC, LDLDIRECT    Wt Readings from Last 3 Encounters:  08/08/16 (!) 160.1 kg (353 lb)  07/29/16 (!) 155.7 kg (343 lb 4 oz)  06/12/16 (!) 151.3 kg (333 lb 8 oz)      ASSESSMENT AND PLAN:  # PVCs: Taylor [name] is completely asymptomatic. However, she does continue to have frequent PVCs. She has no evidence of heart failure on exam. I suspect that her  PVCs may be related to high caffeine intake. However, she did have several on the day of her laparoscopic procedure at which time she had not had any caffeine. Therefore we will obtain laboratory testing to include a CBC, comp to metabolic panel, magnesium, TSH, and free T4. We will also get an echo to ensure that she does not have evidence of heart failure. Given that she also has elevated blood pressure, we will start carvedilol 6.25 mg twice daily.  She does endorse snoring and daytime somnolence. Therefore we will also get a sleep study to see a sleep apnea could be causing her ectopy.  # Hypertension: Start carvedilol 6.25mg  bid.  # Morbid obesity: Patient is considering gastric sleeve.  Recommended increased physical exercise.     Current medicines are reviewed at length with the patient today.  The patient does not have concerns regarding medicines.  The following changes have been made:  Carvedilol 6.25mg  bid  Labs/ tests ordered today include:   Orders Placed This Encounter  Procedures  . T4, free  . TSH  . CBC with Differential/Platelet  . Comprehensive metabolic panel  . Magnesium  . ECHOCARDIOGRAM COMPLETE  . Split night study     Disposition:   FU with Taylor Topham C. Duke Salvia, Taylor [name], [facility] in 1 month    This note was written with the assistance of speech recognition software.  Please excuse any transcriptional errors.  Signed, Elynn Patteson C. Duke Salvia, Taylor [name], [facility]  08/08/2016 9:58 AM    [facility]

## 2016-08-08 ENCOUNTER — Encounter: Payer: Self-pay | Admitting: Cardiovascular Disease

## 2016-08-08 ENCOUNTER — Ambulatory Visit (INDEPENDENT_AMBULATORY_CARE_PROVIDER_SITE_OTHER): Payer: 59 | Admitting: Cardiovascular Disease

## 2016-08-08 VITALS — BP 160/88 | HR 69 | Ht 67.0 in | Wt 353.0 lb

## 2016-08-08 DIAGNOSIS — I10 Essential (primary) hypertension: Secondary | ICD-10-CM

## 2016-08-08 DIAGNOSIS — R400 Somnolence: Secondary | ICD-10-CM

## 2016-08-08 DIAGNOSIS — R0683 Snoring: Secondary | ICD-10-CM | POA: Diagnosis not present

## 2016-08-08 DIAGNOSIS — R002 Palpitations: Secondary | ICD-10-CM

## 2016-08-08 HISTORY — DX: Essential (primary) hypertension: I10

## 2016-08-08 MED ORDER — CARVEDILOL 6.25 MG PO TABS: 6.2500 mg | ORAL_TABLET | Freq: Two times a day (BID) | ORAL | 3 refills | Status: DC

## 2016-08-08 NOTE — Patient Instructions (Addendum)
Medication Instructions:  START CARVEDILOL 6.25 MG TWICE A DAY  Labwork: CMET/MAGNESIUM/CBC/THS/FT4 AT [facility] ON THE FIRST FLOOR  Testing/Procedures: Your physician has requested that you have an echocardiogram. Echocardiography is a painless test that uses sound waves to create images of your heart. It provides your doctor with information about the size and shape of your heart and how well your heart's chambers and valves are working. This procedure takes approximately one hour. There are no restrictions for this procedure. [facility] AT [address]  Your physician has recommended that you have a sleep study. This test records several body functions during sleep, including: brain activity, eye movement, oxygen and carbon dioxide blood levels, heart rate and rhythm, breathing rate and rhythm, the flow of air through your mouth and nose, snoring, body muscle movements, and chest and belly movement.  Follow-Up: Your physician recommends that you schedule a follow-up appointment in: 1 MONTH OV   If you need a refill on your cardiac medications before your next appointment, please call your pharmacy.

## 2016-08-08 NOTE — Addendum Note (Signed)
Addended by: Regis BillPRATT, Lynze Reddy B on: 08/08/2016 01:19 PM   Modules accepted: Orders

## 2016-08-15 ENCOUNTER — Ambulatory Visit
Admission: RE | Admit: 2016-08-15 | Discharge: 2016-08-15 | Disposition: A | Discharge status: Home / Self Care | Payer: 59 | Source: Ambulatory Visit | Attending: Cardiovascular Disease | Admitting: Cardiovascular Disease

## 2016-08-15 ENCOUNTER — Other Ambulatory Visit
Admission: RE | Admit: 2016-08-15 | Discharge: 2016-08-15 | Disposition: A | Discharge status: Home / Self Care | Payer: 59 | Source: Ambulatory Visit | Attending: Cardiovascular Disease | Admitting: Cardiovascular Disease

## 2016-08-15 DIAGNOSIS — R002 Palpitations: Secondary | ICD-10-CM | POA: Diagnosis not present

## 2016-08-15 LAB — COMPREHENSIVE METABOLIC PANEL
ALT: 19 U/L (ref 14–54)
AST: 17 U/L (ref 15–41)
Albumin: 3.9 g/dL (ref 3.5–5.0)
Alkaline Phosphatase: 82 U/L (ref 38–126)
Anion gap: 8 (ref 5–15)
BUN: 16 mg/dL (ref 6–20)
CHLORIDE: 105 mmol/L (ref 101–111)
CO2: 24 mmol/L (ref 22–32)
CREATININE: 0.63 mg/dL (ref 0.44–1.00)
Calcium: 9 mg/dL (ref 8.9–10.3)
GFR calc Af Amer: >60 mL/min (ref >60)
GFR calc non Af Amer: >60 mL/min (ref >60)
Glucose, Bld: 97 mg/dL (ref 65–99)
Potassium: 3.9 mmol/L (ref 3.5–5.1)
SODIUM: 137 mmol/L (ref 135–145)
Total Bilirubin: 0.3 mg/dL (ref 0.3–1.2)
Total Protein: 8 g/dL (ref 6.5–8.1)

## 2016-08-15 LAB — CBC WITH DIFFERENTIAL/PLATELET
BASOS ABS: 0.1 10*3/uL (ref 0–0.1)
BASOS PCT: 1 %
EOS ABS: 0.1 10*3/uL (ref 0–0.7)
EOS PCT: 2 %
HCT: 37 % (ref 35.0–47.0)
Hemoglobin: 12.6 g/dL (ref 12.0–16.0)
LYMPHS PCT: 25 %
Lymphs Abs: 2.2 10*3/uL (ref 1.0–3.6)
MCH: 28.7 pg (ref 26.0–34.0)
MCHC: 34.2 g/dL (ref 32.0–36.0)
MCV: 83.9 fL (ref 80.0–100.0)
Monocytes Absolute: 0.7 10*3/uL (ref 0.2–0.9)
Monocytes Relative: 8 %
Neutro Abs: 5.7 10*3/uL (ref 1.4–6.5)
Neutrophils Relative %: 64 %
PLATELETS: 327 10*3/uL (ref 150–440)
RBC: 4.41 MIL/uL (ref 3.80–5.20)
RDW: 14.6 % — ABNORMAL HIGH (ref 11.5–14.5)
WBC: 8.9 10*3/uL (ref 3.6–11.0)

## 2016-08-15 LAB — MAGNESIUM: Magnesium: 2.1 mg/dL (ref 1.7–2.4)

## 2016-08-15 LAB — TSH: TSH: 14.989 u[IU]/mL — AB (ref 0.350–4.500)

## 2016-08-15 LAB — T4, FREE: FREE T4: 0.65 ng/dL (ref 0.61–1.12)

## 2016-08-15 NOTE — Progress Notes (Signed)
*  PRELIMINARY RESULTS* Echocardiogram 2D Echocardiogram has been performed.  Cristela BlueHege, Keyonda Bickle 08/15/2016, 10:34 AM

## 2016-08-30 ENCOUNTER — Telehealth: Payer: Self-pay | Admitting: Cardiovascular Disease

## 2016-09-11 ENCOUNTER — Ambulatory Visit (HOSPITAL_BASED_OUTPATIENT_CLINIC_OR_DEPARTMENT_OTHER): Payer: 59 | Attending: Cardiovascular Disease

## 2016-09-13 ENCOUNTER — Encounter: Payer: Self-pay | Admitting: *Deleted

## 2016-09-13 ENCOUNTER — Ambulatory Visit: Payer: 59 | Admitting: Cardiovascular Disease

## 2016-09-13 NOTE — Progress Notes (Deleted)
Cardiology Office Note   Date:  09/13/2016   ID:  Taylor [name], DOB 11-12-1985, MRN [id number]  PCP:  Philemon Kingdom, MD  Cardiologist:   Chilton Si, MD   No chief complaint on file.     History of Present Illness: Taylor [name] is a 30 y.o. female with hypertension and hypothyroidism who presents for an evaluation of PVCs.  [name] underwent a laproscopic ovarian cystectomy on 08/06/16 and was noted to have asymptomatic PVCs.  She was referred for cardiology for further evaluation.  She reports that She has been feeling well. She denies any palpitations, lightheadedness, dizziness, chest pain, shortness of breath, orthopnea or PND. She does endorse some mild lower extremity edema after she has been standing for a long time at work. However it always improves with elevation of her legs. 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 Therefore she walks around the hospital all day long and has no exertional symptoms. She is a little bit short of breath walking upstairs, but she attributes this to her weight and being out of shape.  [name] has a family history of premature coronary artery disease. Her father had stents placed at age 66.  she has a history of hypertension and was previously on hydrochlorothiazide prior to her pregnancy. However she reports that her blood pressure has been well-controlled since delivery.   she quit smoking in 2015. She does note that she drinks up to 6 Cokes daily.  She denies using over the counter cold or cough medications.    At her last appointment [name] was started on carvedilol for both hypertension and PVCs.  Past Medical History:  Diagnosis Date  . Anxiety   . Complication of anesthesia    Hard to put under anesthesia, combative when waking up  . Depression   . Essential hypertension 08/08/2016  . H/O back injury    broken back age 89  . Hypertension    with pregnancy  .  Hypothyroidism     Past Surgical History:  Procedure Laterality Date  . CESAREAN SECTION N/A 11/22/2014   Procedure: CESAREAN SECTION;  Surgeon: Turner Daniels, MD;  Location: [facility];  Service: Obstetrics;  Laterality: N/A;  . CHOLECYSTECTOMY N/A 05/29/2016   Procedure: LAPAROSCOPIC CHOLECYSTECTOMY;  Surgeon: Tiney Rouge III, MD;  Location: [facility];  Service: General;  Laterality: N/A;  . LAPAROSCOPY Left 08/06/2016   Procedure: LAPAROSCOPY DIAGNOSTIC with drainage of left ovarian cyst;  Surgeon: Marcelle Overlie, MD;  Location: [facility];  Service: Gynecology;  Laterality: Left;  . WISDOM TOOTH EXTRACTION       Current Outpatient Prescriptions  Medication Sig Dispense Refill  . ALPRAZolam (XANAX) 0.25 MG tablet Take 0.25 mg by mouth 2 (two) times daily as needed for anxiety.    . carvedilol (COREG) 6.25 MG tablet Take 1 tablet (6.25 mg total) by mouth 2 (two) times daily. 60 tablet 3  . FLUoxetine (PROZAC) 20 MG tablet Take 20 mg by mouth daily.    Marland Kitchen levothyroxine (SYNTHROID, LEVOTHROID) 50 MCG tablet Take 1 tablet by mouth daily.  4  . oxyCODONE-acetaminophen (PERCOCET) 10-325 MG tablet Take 1-2 tablets by mouth every 4 (four) hours as needed for pain.     No current facility-administered medications for this visit.     Allergies:   Keflex [cephalexin]     Social History:  The patient  reports that she quit smoking about 2 years ago. Her  smoking use included Cigarettes. She has a 10.00 pack-year smoking history. She has never used smokeless tobacco. She reports that she does not drink alcohol or use drugs.   Family History:  The patient's family history includes Atrial fibrillation in her maternal grandmother; CAD in her father; Cancer in her maternal grandmother and paternal grandmother; Diabetes in her maternal grandfather; Heart disease in her maternal grandmother; Hypertension in her father, maternal grandfather, maternal grandmother, mother, paternal grandfather, and paternal grandmother;  Stroke in her maternal grandmother; Valvular heart disease in her maternal grandmother.    ROS:  Please see the history of present illness.   Otherwise, review of systems are positive for none.   All other systems are reviewed and negative.    PHYSICAL EXAM: VS:  There were no vitals taken for this visit. , BMI There is no height or weight on file to calculate BMI. GENERAL:  Well appearing HEENT:  Pupils equal round and reactive, fundi not visualized, oral mucosa unremarkable NECK:  No jugular venous distention, waveform within normal limits, carotid upstroke brisk and symmetric, no bruits, no thyromegaly LYMPHATICS:  No cervical adenopathy LUNGS:  Clear to auscultation bilaterally HEART:  Mostly regular with occasional ectopy. PMI not displaced or sustained,S1 and S2 within normal limits, no S3, no S4, no clicks, no rubs, no murmurs ABD:  Flat, positive bowel sounds normal in frequency in pitch, no bruits, no rebound, no guarding, no midline pulsatile mass, no hepatomegaly, no splenomegaly EXT:  2 plus pulses throughout, no edema, no cyanosis no clubbing SKIN:  No rashes no nodules NEURO:  Cranial nerves II through XII grossly intact, motor grossly intact throughout PSYCH:  Cognitively intact, oriented to person place and time    EKG:  EKG is ordered today. The ekg ordered today demonstratessinus rhythm. Rate 68 bpm. 2 PVCs.  cent Labs: 08/15/2016: ALT 19; BUN 16; Creatinine, Ser 0.63; Hemoglobin 12.6; Magnesium 2.1; Platelets 327; Potassium 3.9; Sodium 137; TSH 14.989    Lipid Panel No results found for: CHOL, TRIG, HDL, CHOLHDL, VLDL, LDLCALC, LDLDIRECT    Wt Readings from Last 3 Encounters:  08/08/16 (!) 160.1 kg (353 lb)  07/29/16 (!) 155.7 kg (343 lb 4 oz)  06/12/16 (!) 151.3 kg (333 lb 8 oz)      ASSESSMENT AND PLAN:  # PVCs: [name] is completely asymptomatic. However, she does continue to have frequent PVCs. She has no evidence of heart failure on exam. I suspect  that her PVCs may be related to high caffeine intake. However, she did have several on the day of her laparoscopic procedure at which time she had not had any caffeine. Therefore we will obtain laboratory testing to include a CBC, comp to metabolic panel, magnesium, TSH, and free T4. We will also get an echo to ensure that she does not have evidence of heart failure. Given that she also has elevated blood pressure, we will start carvedilol 6.25 mg twice daily.  She does endorse snoring and daytime somnolence. Therefore we will also get a sleep study to see a sleep apnea could be causing her ectopy.  # Hypertension: Start carvedilol 6.25mg  bid.  # Morbid obesity: Patient is considering gastric sleeve.  Recommended increased physical exercise.     Current medicines are reviewed at length with the patient today.  The patient does not have concerns regarding medicines.  The following changes have been made:  Carvedilol 6.25mg  bid  Labs/ tests ordered today include:   No orders of the defined types  were placed in this encounter.    Disposition:   FU with Taylor Guia C. Duke Salviaandolph, MD, [facility] in 1 month    This note was written with the assistance of speech recognition software.  Please excuse any transcriptional errors.  Signed, Taylor Roselle C. Duke Salviaandolph, MD, [facility]  09/13/2016 10:51 AM    [facility]

## 2016-10-01 DIAGNOSIS — H5213 Myopia, bilateral: Secondary | ICD-10-CM | POA: Diagnosis not present

## 2016-10-10 NOTE — Telephone Encounter (Signed)
Closed Encounter  °

## 2016-10-18 ENCOUNTER — Telehealth: Payer: Self-pay | Admitting: *Deleted

## 2016-10-18 NOTE — Telephone Encounter (Signed)
Patient was a no show for her scheduled sleep study

## 2016-12-09 DIAGNOSIS — R002 Palpitations: Secondary | ICD-10-CM | POA: Diagnosis not present

## 2016-12-09 DIAGNOSIS — E039 Hypothyroidism, unspecified: Secondary | ICD-10-CM | POA: Diagnosis not present

## 2016-12-09 DIAGNOSIS — F419 Anxiety disorder, unspecified: Secondary | ICD-10-CM | POA: Diagnosis not present

## 2016-12-29 ENCOUNTER — Encounter: Payer: 59 | Admitting: Family

## 2016-12-29 NOTE — Progress Notes (Signed)
Based on what you shared with me it looks like you have a serious condition that should be evaluated in a face to face office visit.  NOTE: Even if you have entered your credit card information for this eVisit, you will not be charged.   I am sorry we can prescribe for anyone under 8910 years old.  If you are having a true medical emergency please call 911.  If you need an urgent face to face visit, [facility] has four urgent care centers for your convenience.  If you need care fast and have a high deductible or no insurance consider:   [email or web address]  [phone]  [address], [address] [place], [place] [address] 8 am to 8 pm Monday-Friday 10 am to 4 pm Saturday-Sunday   The following sites will take your  insurance:    . [facility]  [phone] Get Driving Directions Find a Provider at this Location  [address] [place], [place] [address] . 10 am to 8 pm Monday-Friday . 12 pm to 8 pm Saturday-Sunday   . [facility]  [phone] Get Driving Directions Find a Provider at this Location  [address], [address] [place], [place] [address] . 8 am to 8 pm Monday-Friday . 9 am to 6 pm Saturday . 11 am to 6 pm Sunday   . [facility] [place]  [phone] Get Driving Directions  [address] [place], [place] [address] . 8 am to 8 pm Monday-Friday . 8 am to 4 pm Saturday-Sunday   Your e-visit answers were reviewed by a board certified advanced clinical practitioner to complete your personal care plan.  Thank you for using e-Visits.

## 2017-02-10 DIAGNOSIS — I10 Essential (primary) hypertension: Secondary | ICD-10-CM | POA: Diagnosis not present

## 2017-02-10 DIAGNOSIS — F419 Anxiety disorder, unspecified: Secondary | ICD-10-CM | POA: Diagnosis not present

## 2017-02-10 DIAGNOSIS — E039 Hypothyroidism, unspecified: Secondary | ICD-10-CM | POA: Diagnosis not present

## 2017-02-20 ENCOUNTER — Other Ambulatory Visit
Admission: RE | Admit: 2017-02-20 | Discharge: 2017-02-20 | Disposition: A | Discharge status: Home / Self Care | Payer: 59 | Source: Ambulatory Visit | Attending: Internal Medicine | Admitting: Internal Medicine

## 2017-02-20 DIAGNOSIS — E039 Hypothyroidism, unspecified: Secondary | ICD-10-CM | POA: Diagnosis not present

## 2017-02-20 LAB — TSH: TSH: 7.389 u[IU]/mL — AB (ref 0.350–4.500)

## 2017-02-22 LAB — T4: T4 TOTAL: 6.6 ug/dL (ref 4.5–12.0)

## 2017-03-02 ENCOUNTER — Encounter: Payer: Self-pay | Admitting: Emergency Medicine

## 2017-03-02 ENCOUNTER — Emergency Department
Admission: EM | Admit: 2017-03-02 | Discharge: 2017-03-02 | Disposition: A | Discharge status: Home / Self Care | Payer: 59 | Attending: Emergency Medicine | Admitting: Emergency Medicine

## 2017-03-02 DIAGNOSIS — I10 Essential (primary) hypertension: Secondary | ICD-10-CM | POA: Insufficient documentation

## 2017-03-02 DIAGNOSIS — Z79899 Other long term (current) drug therapy: Secondary | ICD-10-CM | POA: Diagnosis not present

## 2017-03-02 DIAGNOSIS — E039 Hypothyroidism, unspecified: Secondary | ICD-10-CM | POA: Diagnosis not present

## 2017-03-02 DIAGNOSIS — W57XXXA Bitten or stung by nonvenomous insect and other nonvenomous arthropods, initial encounter: Secondary | ICD-10-CM | POA: Diagnosis not present

## 2017-03-02 DIAGNOSIS — Y939 Activity, unspecified: Secondary | ICD-10-CM | POA: Insufficient documentation

## 2017-03-02 DIAGNOSIS — S1086XA Insect bite of other specified part of neck, initial encounter: Secondary | ICD-10-CM | POA: Diagnosis not present

## 2017-03-02 DIAGNOSIS — Y998 Other external cause status: Secondary | ICD-10-CM | POA: Insufficient documentation

## 2017-03-02 DIAGNOSIS — Y929 Unspecified place or not applicable: Secondary | ICD-10-CM | POA: Insufficient documentation

## 2017-03-02 DIAGNOSIS — L089 Local infection of the skin and subcutaneous tissue, unspecified: Secondary | ICD-10-CM | POA: Diagnosis not present

## 2017-03-02 DIAGNOSIS — Z87891 Personal history of nicotine dependence: Secondary | ICD-10-CM | POA: Insufficient documentation

## 2017-03-02 MED ORDER — NAPROXEN 500 MG PO TABS
500.0000 mg | ORAL_TABLET | Freq: Once | ORAL | Status: AC
  Administered 2017-03-02: 500 mg via ORAL
  Filled 2017-03-02: qty 1

## 2017-03-02 MED ORDER — DOXYCYCLINE HYCLATE 100 MG PO CAPS: 100.0000 mg | ORAL_CAPSULE | Freq: Two times a day (BID) | ORAL | 0 refills | Status: DC

## 2017-03-02 MED ORDER — DOXYCYCLINE HYCLATE 100 MG PO TABS
100.0000 mg | ORAL_TABLET | Freq: Once | ORAL | Status: AC
  Administered 2017-03-02: 100 mg via ORAL
  Filled 2017-03-02: qty 1

## 2017-03-02 MED ORDER — NAPROXEN 500 MG PO TABS: 500.0000 mg | ORAL_TABLET | Freq: Two times a day (BID) | ORAL | 0 refills | Status: DC

## 2017-03-02 NOTE — ED Provider Notes (Signed)
[facility] Emergency Department Provider Note  ____________________________________________  Time seen: Approximately 7:45 AM  I have reviewed the triage vital signs and the nursing notes.   HISTORY  Chief Complaint Insect Bite   HPI Taylor [name] is a 31 y.o. female who presents to the emergency department for evaluation of a red, swollen, tender area on the back of her neck. She states that symptoms started about 3 days ago and the pain has continued to worsen and is now spreading to the left side of her face and to her shoulder.She has taken Tylenol without relief.  Past Medical History:  Diagnosis Date  . Anxiety   . Complication of anesthesia    Hard to put under anesthesia, combative when waking up  . Depression   . Essential hypertension 08/08/2016  . H/O back injury    broken back age 60  . Hypertension    with pregnancy  . Hypothyroidism     Patient Active Problem List   Diagnosis Date Noted  . Essential hypertension 08/08/2016  . PVCs (premature ventricular contractions) 08/06/2016  . Cholecystitis 06/03/2016  . Abdominal pain 05/28/2016  . Gallstones   . Normal pregnancy 11/21/2014  . Echogenic intracardiac focus of fetus on prenatal ultrasound   . Obesity complicating pregnancy   . Pre-existing essential hypertension complicating pregnancy in third trimester   . [redacted] weeks gestation of pregnancy   . [redacted] weeks gestation of pregnancy   . Chronic hypertension complicating or reason for care during pregnancy   . Obesity in pregnancy, antepartum   . [redacted] weeks gestation of pregnancy     Past Surgical History:  Procedure Laterality Date  . CESAREAN SECTION N/A 11/22/2014   Procedure: CESAREAN SECTION;  Surgeon: Turner Daniels, MD;  Location: [facility];  Service: Obstetrics;  Laterality: N/A;  . CHOLECYSTECTOMY N/A 05/29/2016   Procedure: LAPAROSCOPIC CHOLECYSTECTOMY;  Surgeon: Tiney Rouge III, MD;  Location: [facility] ORS;  Service: General;   Laterality: N/A;  . LAPAROSCOPY Left 08/06/2016   Procedure: LAPAROSCOPY DIAGNOSTIC with drainage of left ovarian cyst;  Surgeon: Marcelle Overlie, MD;  Location: [facility];  Service: Gynecology;  Laterality: Left;  . WISDOM TOOTH EXTRACTION      Prior to Admission medications   Medication Sig Start Date End Date Taking? Authorizing Provider  ALPRAZolam (XANAX) 0.25 MG tablet Take 0.25 mg by mouth 2 (two) times daily as needed for anxiety.    [provider]  carvedilol (COREG) 6.25 MG tablet Take 1 tablet (6.25 mg total) by mouth 2 (two) times daily. 08/08/16   Chilton Si, MD  doxycycline (VIBRAMYCIN) 100 MG capsule Take 1 capsule (100 mg total) by mouth 2 (two) times daily. 03/02/17   Bobbiejo Ishikawa B, FNP  FLUoxetine (PROZAC) 20 MG tablet Take 20 mg by mouth daily.    [provider]  levothyroxine (SYNTHROID, LEVOTHROID) 50 MCG tablet Take 1 tablet by mouth daily. 03/20/16   [provider]  naproxen (NAPROSYN) 500 MG tablet Take 1 tablet (500 mg total) by mouth 2 (two) times daily with a meal. 03/02/17   Eriverto Byrnes B, FNP  oxyCODONE-acetaminophen (PERCOCET) 10-325 MG tablet Take 1-2 tablets by mouth every 4 (four) hours as needed for pain.    [provider]    Allergies Keflex [cephalexin]  Family History  Problem Relation Age of Onset  . Hypertension Mother   . Hypertension Father   . CAD Father   . Heart disease Maternal Grandmother   .  Hypertension Maternal Grandmother   . Stroke Maternal Grandmother   . Cancer Maternal Grandmother        breast colon  . Atrial fibrillation Maternal Grandmother   . Valvular heart disease Maternal Grandmother   . Hypertension Maternal Grandfather   . Diabetes Maternal Grandfather   . Hypertension Paternal Grandmother   . Cancer Paternal Grandmother        kidney  . Hypertension Paternal Grandfather     Social History Social History  Substance Use Topics  . Smoking status: Former Smoker     Packs/day: 1.00    Years: 10.00    Types: Cigarettes    Quit date: 07/29/2014  . Smokeless tobacco: Never Used  . Alcohol use No    Review of Systems  Constitutional: Well appearing.  Respiratory: Negative for cough or shortness of breath.  Musculoskeletal: Negative for bodyaches  Skin: Positive for lesion Neurological: Negative for paresthesias ____________________________________________   PHYSICAL EXAM:  VITAL SIGNS: ED Triage Vitals  Enc Vitals Group     BP 03/02/17 0722 (!) 157/101     Pulse Rate 03/02/17 0721 (!) 107     Resp 03/02/17 0721 16     Temp 03/02/17 0721 98.4 F (36.9 C)     Temp src --      SpO2 03/02/17 0721 100 %     Weight 03/02/17 0721 (!) 350 lb (158.8 kg)     Height 03/02/17 0721 5\' 7"  (1.702 m)     Head Circumference --      Peak Flow --      Pain Score 03/02/17 0719 8     Pain Loc --      Pain Edu? --      Excl. in GC? --      Constitutional: Well appearing. Eyes: Conjunctivae are clear and without drainage Nose: No rhinorrhea Mouth/Throat: Airway patent. Oropharynx appears normal Neck: No stridor. No meningismus. Lymphatic: No cervical adenopathy  Respiratory: Respirations even and unlabored. Musculoskeletal: Full range of motion throughout. Neurologic: Alert and oriented. GCS of 15. No decreased motor or sensation Skin: Superficial skin Lesion noted on the posterior aspect of the neck on the right side. Area is approximately 2 cm in diameter with a central area of necrosis. Area is erythematous and mildly fluctuant. No lymphangitis.  ____________________________________________   LABS (all labs ordered are listed, but only abnormal results are displayed)  Labs Reviewed - No data to display ____________________________________________  EKG   ____________________________________________  RADIOLOGY   ____________________________________________   PROCEDURES  Procedure(s) performed:  None ____________________________________________   INITIAL IMPRESSION / ASSESSMENT AND PLAN / ED COURSE  [name] is a 31 y.o. female who presents to the emergency department for evaluation and treatment of an insect bite to the back of her neck.Area does appear as potentially a spider bite and she will be treated with doxycycline and Naprosyn. She was instructed to follow-up with her primary care provider for symptoms that are not improving over the next 2 or 3 days. She was instructed to return to the emergency department for symptoms that change or worsen if unable schedule an appointment.   Pertinent labs & imaging results that were available during my care of the patient were reviewed by me and considered in my medical decision making (see chart for details). ____________________________________________   FINAL CLINICAL IMPRESSION(S) / ED DIAGNOSES  Final diagnoses:  Bug bite with infection, initial encounter    Discharge Medication List as of 03/02/2017  7:57 AM  START taking these medications   Details  doxycycline (VIBRAMYCIN) 100 MG capsule Take 1 capsule (100 mg total) by mouth 2 (two) times daily., Starting Sun 03/02/2017, Print    naproxen (NAPROSYN) 500 MG tablet Take 1 tablet (500 mg total) by mouth 2 (two) times daily with a meal., Starting Sun 03/02/2017, Print        If controlled substance prescribed during this visit, 12 month history viewed on the [facility] prior to issuing an initial prescription for Schedule II or III opiod.   Note:  This document was prepared using Dragon voice recognition software and may include unintentional dictation errors.    Chinita Pesterriplett, Sagal Gayton B, FNP 03/02/17 [id number]    Jeanmarie PlantMcShane, James A, MD 03/02/17 [phone]

## 2017-03-02 NOTE — ED Notes (Signed)
See triage note  States she felt something bite her on the back of neck about 3 days ago  Red swollen area noted   States pain is spreading to left side of face and into shoulder area

## 2017-03-02 NOTE — ED Triage Notes (Signed)
Possible spider bite to back of head.

## 2017-04-11 ENCOUNTER — Other Ambulatory Visit
Admission: RE | Admit: 2017-04-11 | Discharge: 2017-04-11 | Disposition: A | Discharge status: Home / Self Care | Payer: 59 | Source: Ambulatory Visit | Attending: Internal Medicine | Admitting: Internal Medicine

## 2017-04-11 DIAGNOSIS — E039 Hypothyroidism, unspecified: Secondary | ICD-10-CM | POA: Insufficient documentation

## 2017-04-11 LAB — TSH: TSH: 6.55 u[IU]/mL — AB (ref 0.350–4.500)

## 2017-04-14 DIAGNOSIS — I10 Essential (primary) hypertension: Secondary | ICD-10-CM | POA: Diagnosis not present

## 2017-04-14 DIAGNOSIS — F419 Anxiety disorder, unspecified: Secondary | ICD-10-CM | POA: Diagnosis not present

## 2017-04-14 DIAGNOSIS — Z79899 Other long term (current) drug therapy: Secondary | ICD-10-CM | POA: Diagnosis not present

## 2017-04-14 DIAGNOSIS — E039 Hypothyroidism, unspecified: Secondary | ICD-10-CM | POA: Diagnosis not present

## 2017-05-02 ENCOUNTER — Telehealth: Payer: 59 | Admitting: Family

## 2017-05-02 DIAGNOSIS — N390 Urinary tract infection, site not specified: Secondary | ICD-10-CM | POA: Diagnosis not present

## 2017-05-02 MED ORDER — NITROFURANTOIN MONOHYD MACRO 100 MG PO CAPS: 100.0000 mg | ORAL_CAPSULE | Freq: Two times a day (BID) | ORAL | 0 refills | Status: DC

## 2017-05-02 NOTE — Progress Notes (Signed)
Thank you for the details you put in the comment boxes. Those details really help us take better care of you.   We are sorry that you are not feeling well.  Here is how we plan to help!  Based on what you shared with me it looks like you most likely have a simple urinary tract infection.  A UTI (Urinary Tract Infection) is a bacterial infection of the bladder.  Most cases of urinary tract infections are simple to treat but a key part of your care is to encourage you to drink plenty of fluids and watch your symptoms carefully.  I have prescribed MacroBid 100 mg twice a day for 5 days.  Your symptoms should gradually improve. Call us if the burning in your urine worsens, you develop worsening fever, back pain or pelvic pain or if your symptoms do not resolve after completing the antibiotic.  Urinary tract infections can be prevented by drinking plenty of water to keep your body hydrated.  Also be sure when you wipe, wipe from front to back and don't hold it in!  If possible, empty your bladder every 4 hours.  Your e-visit answers were reviewed by a board certified advanced clinical practitioner to complete your personal care plan.  Depending on the condition, your plan could have included both over the counter or prescription medications.  If there is a problem please reply  once you have received a response from your provider.  Your safety is important to us.  If you have drug allergies check your prescription carefully.    You can use MyChart to ask questions about today's visit, request a non-urgent call back, or ask for a work or school excuse for 24 hours related to this e-Visit. If it has been greater than 24 hours you will need to follow up with your provider, or enter a new e-Visit to address those concerns.   You will get an e-mail in the next two days asking about your experience.  I hope that your e-visit has been valuable and will speed your recovery. Thank you for using  e-visits.    

## 2017-06-19 ENCOUNTER — Other Ambulatory Visit: Payer: Self-pay | Admitting: Surgery

## 2017-06-19 ENCOUNTER — Other Ambulatory Visit (HOSPITAL_COMMUNITY): Payer: Self-pay | Admitting: Surgery

## 2017-06-23 ENCOUNTER — Other Ambulatory Visit: Payer: Self-pay | Admitting: Surgery

## 2017-06-25 DIAGNOSIS — E039 Hypothyroidism, unspecified: Secondary | ICD-10-CM | POA: Diagnosis not present

## 2017-06-25 DIAGNOSIS — F419 Anxiety disorder, unspecified: Secondary | ICD-10-CM | POA: Diagnosis not present

## 2017-06-25 DIAGNOSIS — I10 Essential (primary) hypertension: Secondary | ICD-10-CM | POA: Diagnosis not present

## 2017-06-28 ENCOUNTER — Other Ambulatory Visit
Admission: RE | Admit: 2017-06-28 | Discharge: 2017-06-28 | Disposition: A | Discharge status: Home / Self Care | Payer: 59 | Source: Ambulatory Visit | Attending: Internal Medicine | Admitting: Internal Medicine

## 2017-06-28 ENCOUNTER — Other Ambulatory Visit
Admission: RE | Admit: 2017-06-28 | Discharge: 2017-06-28 | Disposition: A | Discharge status: Home / Self Care | Payer: 59 | Source: Ambulatory Visit | Attending: Surgery | Admitting: Surgery

## 2017-06-28 DIAGNOSIS — E039 Hypothyroidism, unspecified: Secondary | ICD-10-CM | POA: Diagnosis not present

## 2017-06-28 DIAGNOSIS — R002 Palpitations: Secondary | ICD-10-CM | POA: Insufficient documentation

## 2017-06-28 LAB — CBC WITH DIFFERENTIAL/PLATELET
BASOS ABS: 0 10*3/uL (ref 0–0.1)
Basophils Relative: 1 %
Eosinophils Absolute: 0.2 10*3/uL (ref 0–0.7)
Eosinophils Relative: 2 %
HEMATOCRIT: 36.6 % (ref 35.0–47.0)
HEMOGLOBIN: 12.1 g/dL (ref 12.0–16.0)
LYMPHS PCT: 24 %
Lymphs Abs: 1.6 10*3/uL (ref 1.0–3.6)
MCH: 27.9 pg (ref 26.0–34.0)
MCHC: 33.2 g/dL (ref 32.0–36.0)
MCV: 84 fL (ref 80.0–100.0)
Monocytes Absolute: 0.4 10*3/uL (ref 0.2–0.9)
Monocytes Relative: 7 %
NEUTROS ABS: 4.3 10*3/uL (ref 1.4–6.5)
NEUTROS PCT: 66 %
PLATELETS: 329 10*3/uL (ref 150–440)
RBC: 4.35 MIL/uL (ref 3.80–5.20)
RDW: 15.5 % — ABNORMAL HIGH (ref 11.5–14.5)
WBC: 6.5 10*3/uL (ref 3.6–11.0)

## 2017-06-28 LAB — COMPREHENSIVE METABOLIC PANEL
ALT: 25 U/L (ref 14–54)
AST: 23 U/L (ref 15–41)
Albumin: 3.7 g/dL (ref 3.5–5.0)
Alkaline Phosphatase: 70 U/L (ref 38–126)
Anion gap: 7 (ref 5–15)
BUN: 14 mg/dL (ref 6–20)
CHLORIDE: 104 mmol/L (ref 101–111)
CO2: 26 mmol/L (ref 22–32)
CREATININE: 0.66 mg/dL (ref 0.44–1.00)
Calcium: 8.9 mg/dL (ref 8.9–10.3)
GFR calc Af Amer: >60 mL/min (ref >60)
Glucose, Bld: 93 mg/dL (ref 65–99)
Potassium: 4 mmol/L (ref 3.5–5.1)
Sodium: 137 mmol/L (ref 135–145)
Total Bilirubin: 0.5 mg/dL (ref 0.3–1.2)
Total Protein: 7.5 g/dL (ref 6.5–8.1)

## 2017-06-28 LAB — VITAMIN B12: VITAMIN B 12: 344 pg/mL (ref 180–914)

## 2017-06-28 LAB — LIPID PANEL
Cholesterol: 175 mg/dL (ref 0–200)
HDL: 36 mg/dL — AB (ref >40)
LDL Cholesterol: 111 mg/dL — ABNORMAL HIGH (ref 0–99)
Total CHOL/HDL Ratio: 4.9 RATIO
Triglycerides: 142 mg/dL (ref <150)
VLDL: 28 mg/dL (ref 0–40)

## 2017-06-28 LAB — BASIC METABOLIC PANEL
Anion gap: 6 (ref 5–15)
BUN: 14 mg/dL (ref 6–20)
CO2: 27 mmol/L (ref 22–32)
Calcium: 8.9 mg/dL (ref 8.9–10.3)
Chloride: 105 mmol/L (ref 101–111)
Creatinine, Ser: 0.63 mg/dL (ref 0.44–1.00)
GFR calc non Af Amer: >60 mL/min (ref >60)
Glucose, Bld: 93 mg/dL (ref 65–99)
POTASSIUM: 4 mmol/L (ref 3.5–5.1)
SODIUM: 138 mmol/L (ref 135–145)

## 2017-06-28 LAB — TSH
TSH: 5.096 u[IU]/mL — AB (ref 0.350–4.500)
TSH: 5.328 u[IU]/mL — AB (ref 0.350–4.500)

## 2017-06-28 LAB — HEMOGLOBIN A1C
HEMOGLOBIN A1C: 5.5 % (ref 4.8–5.6)
MEAN PLASMA GLUCOSE: 111.15 mg/dL

## 2017-06-28 LAB — HCG, QUANTITATIVE, PREGNANCY: hCG, Beta Chain, Quant, S: 1 m[IU]/mL (ref <5)

## 2017-06-28 LAB — IRON: IRON: 38 ug/dL (ref 28–170)

## 2017-06-29 DIAGNOSIS — S82441A Displaced spiral fracture of shaft of right fibula, initial encounter for closed fracture: Secondary | ICD-10-CM | POA: Diagnosis not present

## 2017-06-29 DIAGNOSIS — S82401A Unspecified fracture of shaft of right fibula, initial encounter for closed fracture: Secondary | ICD-10-CM | POA: Diagnosis not present

## 2017-06-29 DIAGNOSIS — S92901A Unspecified fracture of right foot, initial encounter for closed fracture: Secondary | ICD-10-CM | POA: Diagnosis not present

## 2017-06-29 DIAGNOSIS — S82891A Other fracture of right lower leg, initial encounter for closed fracture: Secondary | ICD-10-CM | POA: Diagnosis not present

## 2017-06-29 LAB — THYROID PEROXIDASE ANTIBODY: THYROID PEROXIDASE ANTIBODY: 255 [IU]/mL — AB (ref 0–34)

## 2017-06-29 LAB — T4: T4 TOTAL: 7.3 ug/dL (ref 4.5–12.0)

## 2017-06-30 LAB — VITAMIN D 25 HYDROXY (VIT D DEFICIENCY, FRACTURES): VIT D 25 HYDROXY: 18.3 ng/mL — AB (ref 30.0–100.0)

## 2017-06-30 LAB — VITAMIN B1: VITAMIN B1 (THIAMINE): 93.2 nmol/L (ref 66.5–200.0)

## 2017-07-01 ENCOUNTER — Encounter: Payer: Self-pay | Admitting: Podiatry

## 2017-07-01 ENCOUNTER — Ambulatory Visit (INDEPENDENT_AMBULATORY_CARE_PROVIDER_SITE_OTHER): Payer: 59 | Admitting: Podiatry

## 2017-07-01 VITALS — BP 124/79 | HR 93

## 2017-07-01 DIAGNOSIS — S8264XA Nondisplaced fracture of lateral malleolus of right fibula, initial encounter for closed fracture: Secondary | ICD-10-CM

## 2017-07-01 DIAGNOSIS — S92324A Nondisplaced fracture of second metatarsal bone, right foot, initial encounter for closed fracture: Secondary | ICD-10-CM | POA: Diagnosis not present

## 2017-07-01 DIAGNOSIS — R269 Unspecified abnormalities of gait and mobility: Secondary | ICD-10-CM

## 2017-07-01 DIAGNOSIS — M79671 Pain in right foot: Secondary | ICD-10-CM

## 2017-07-01 LAB — MISC LABCORP TEST (SEND OUT): Labcorp test code: 180836

## 2017-07-01 LAB — H.PYLORI BREATH TEST (REFLEX): H. PYLORI BREATH TEST: NEGATIVE

## 2017-07-01 LAB — H. PYLORI BREATH TEST

## 2017-07-02 ENCOUNTER — Encounter: Payer: 59 | Attending: Surgery | Admitting: Registered"

## 2017-07-02 ENCOUNTER — Encounter: Payer: Self-pay | Admitting: Registered"

## 2017-07-02 DIAGNOSIS — I10 Essential (primary) hypertension: Secondary | ICD-10-CM | POA: Insufficient documentation

## 2017-07-02 DIAGNOSIS — F419 Anxiety disorder, unspecified: Secondary | ICD-10-CM | POA: Diagnosis not present

## 2017-07-02 DIAGNOSIS — Z6841 Body Mass Index (BMI) 40.0 and over, adult: Secondary | ICD-10-CM | POA: Diagnosis not present

## 2017-07-02 DIAGNOSIS — Z79899 Other long term (current) drug therapy: Secondary | ICD-10-CM | POA: Diagnosis not present

## 2017-07-02 DIAGNOSIS — E079 Disorder of thyroid, unspecified: Secondary | ICD-10-CM | POA: Diagnosis not present

## 2017-07-02 DIAGNOSIS — Z713 Dietary counseling and surveillance: Secondary | ICD-10-CM | POA: Diagnosis not present

## 2017-07-02 DIAGNOSIS — Z87891 Personal history of nicotine dependence: Secondary | ICD-10-CM | POA: Insufficient documentation

## 2017-07-02 DIAGNOSIS — E669 Obesity, unspecified: Secondary | ICD-10-CM

## 2017-07-02 NOTE — Progress Notes (Signed)
Pre-Op Assessment Visit:  Pre-Operative Sleeve gastrectomy Surgery  Medical Nutrition Therapy:  Appt start time: 8:00  End time:  8:56  Patient was seen on 07/02/2017 for Pre-Operative Nutrition Assessment. Assessment and letter of approval faxed to [facility] Bariatric Surgery Program coordinator on 07/02/2017.   Pt expectation of surgery: being able to keep up with 31 year old daughter, below a size 16, being healthier, not being out of breath after going up stairs, to be more active  Pt expectation of Dietitian: be straight-forward (no sugar-coating), and push as hard as can push  Start weight at [facility]: 356.0 BMI: 57.03   Pt states she has to have caffeine in the morning. Pt states she eats with 63 year old daughter and has to be done when daughter is done eating or its a problem.  Pt states she lets her daughters pick out meals during mealtimes.   Per insurance, pt needs 0 SWL visits prior to surgery.    24 hr Dietary Recall: First Meal: skips Snack: crackers and cheese Second Meal: chicken nuggets or peanut butter sandwich or [place], cheese roll-ups Snack: none Third Meal: chicken, rice or hamburger or steak or deer meat, green beans, potatoes, mac and cheese, mushrooms, salad Snack: sometimes ice cream Beverages: coffee, coke, water, flavor pack  Encouraged to engage in 75 minutes of moderate physical activity including cardiovascular and weight baring weekly  Handouts given during visit include:  . Pre-Op Goals . Bariatric Surgery Protein Shakes . Vitamin and Mineral Recommendations  During the appointment today the following Pre-Op Goals were reviewed with the patient: . Maintain or lose weight as instructed by your surgeon . Make healthy food choices . Begin to limit portion sizes . Limited concentrated sugars and fried foods . Keep fat/sugar in the single digits per serving on          food labels . Practice CHEWING your food  (aim for 30 chews per bite  or until applesauce consistency) . Practice not drinking 15 minutes before, during, and 30 minutes after each meal/snack . Avoid all carbonated beverages  . Avoid/limit caffeinated beverages  . Avoid all sugar-sweetened beverages . Consume 3 meals per day; eat every 3-5 hours . Make a list of non-food related activities . Aim for 64-100 ounces of FLUID daily  . Aim for at least 60-80 grams of PROTEIN daily . Look for a liquid protein source that contain ?15 g protein and ?5 g carbohydrate  (ex: shakes, drinks, shots) . Physical activity is an important part of a healthy lifestyle so keep it moving!  Follow diet recommendations listed below Energy and Macronutrient Recommendations: Calories: 1800 Carbohydrate: 200 Protein: 135 Fat: 50  Demonstrated degree of understanding via:  Teach Back   Teaching Method Utilized:  Visual Auditory Hands on  Barriers to learning/adherence to lifestyle change: family life  Patient to call the Nutrition and Diabetes Education Services to enroll in Pre-Op and Post-Op Nutrition Education when surgery date is scheduled.

## 2017-07-02 NOTE — Progress Notes (Addendum)
Subjective:  Patient ID: Taylor [name], female    DOB: Jun 04, 1986,  MRN: [id number] HPI Chief Complaint  Patient presents with  . Foot Injury    Saturday 06/28/17 tripped over a baby gate and got foot tangled in a rocking chair  went to ER for xrays on early  y.o. female presents with the above complaint. States she fell over a baby gate. Was seen at [facility] and given a splint and advised follow up here. Reports pain and swelling of the R foot. Denies other issues.  Past Medical History:  Diagnosis Date  . Anxiety   . Complication of anesthesia    Hard to put under anesthesia, combative when waking up  . Depression   . Essential hypertension 08/08/2016  . H/O back injury    broken back age 18  . Hypertension    with pregnancy  . Hypothyroidism    Past Surgical History:  Procedure Laterality Date  . CESAREAN SECTION N/A 11/22/2014   Procedure: CESAREAN SECTION;  Surgeon: Turner Daniels, MD;  Location: [facility];  Service: Obstetrics;  Laterality: N/A;  . CHOLECYSTECTOMY N/A 05/29/2016   Procedure: LAPAROSCOPIC CHOLECYSTECTOMY;  Surgeon: Tiney Rouge III, MD;  Location: [facility] ORS;  Service: General;  Laterality: N/A;  . LAPAROSCOPY Left 08/06/2016   Procedure: LAPAROSCOPY DIAGNOSTIC with drainage of left ovarian cyst;  Surgeon: Marcelle Overlie, MD;  Location: [facility];  Service: Gynecology;  Laterality: Left;  . WISDOM TOOTH EXTRACTION      Current Outpatient Prescriptions:  .  ALPRAZolam (XANAX) 0.25 MG tablet, Take 0.25 mg by mouth 2 (two) times daily as needed for anxiety., Disp: , Rfl:  .  FLUoxetine (PROZAC) 20 MG tablet, Take 20 mg by mouth daily., Disp: , Rfl:  .  levothyroxine (SYNTHROID, LEVOTHROID) 50 MCG tablet, Take 1 tablet by mouth daily., Disp: , Rfl: 4 .  oxyCODONE (OXY IR/ROXICODONE) 5 MG immediate release tablet, Take 5 mg by mouth every 6 (six) hours as needed. for pain, Disp: , Rfl: 0 .  carvedilol (COREG) 6.25 MG tablet, Take 1 tablet (6.25 mg  total) by mouth 2 (two) times daily. (Patient not taking: Reported on 07/01/2017), Disp: 60 tablet, Rfl: 3 .  doxycycline (VIBRAMYCIN) 100 MG capsule, Take 1 capsule (100 mg total) by mouth 2 (two) times daily. (Patient not taking: Reported on 07/01/2017), Disp: 20 capsule, Rfl: 0 .  hydrochlorothiazide (MICROZIDE) 12.5 MG capsule, Take 12.5 mg by mouth daily., Disp: , Rfl:  .  naproxen (NAPROSYN) 500 MG tablet, Take 1 tablet (500 mg total) by mouth 2 (two) times daily with a meal. (Patient not taking: Reported on 07/01/2017), Disp: 30 tablet, Rfl: 0 .  nitrofurantoin, macrocrystal-monohydrate, (MACROBID) 100 MG capsule, Take 1 capsule (100 mg total) by mouth 2 (two) times daily. (Patient not taking: Reported on 07/01/2017), Disp: 10 capsule, Rfl: 0 .  oxyCODONE-acetaminophen (PERCOCET) 10-325 MG tablet, Take 1-2 tablets by mouth every 4 (four) hours as needed for pain., Disp: , Rfl:   Allergies  Allergen Reactions  . Keflex [Cephalexin] Anaphylaxis   Review of Systems Objective:   Vitals:   07/01/17 1529  BP: 124/79  Pulse: 93   General AA&O x3. Normal mood and affect.  Vascular Dorsalis pedis and posterior tibial pulses 2/4 bilat. Brisk capillary refill to all digits. Pedal hair present.  Neurologic Epicritic sensation grossly intact.  Dermatologic No open lesions. Interspaces clear of maceration. Nails well groomed and normal in appearance.  Orthopedic:  MMT 5/5 in dorsiflexion, plantarflexion, inversion, and eversion. Normal joint ROM without pain or crepitus. POP right fibula, right medial ankle POP right second third metatarsal bases   Radiographs: Taken and reviewed. R Fibular fracture without displacement. Nondisplaced. No syndesmotic gapping Right second metatarsal base fracture without displacement. Assessment & Plan:  Patient was evaluated and treated and all questions answered.  R Lateral Malleolus Fracture -XR reviewed from ED visit. Isolated lateral malleolus fracture  without syndesmotic widening. -Soft cast consisting of Unna boot and Coban applied for stabilization. -Dispensed CAM boot for immobilization and protection. Patient allowed to WB in CAM boot. -Will need WB X-ray at next visit.  R 2nd Metatarsal Base Fx -XR Reviewed. Non-displaced. -Soft cast applied as above. -WBAT in CAM boot -Will need WB X-Ray at next visit. Will get contralateral side XR for comparison as well.  Return in about 2 weeks (around 07/15/2017) for f/u Ankle/Lisfranc Fx.

## 2017-07-14 ENCOUNTER — Ambulatory Visit (INDEPENDENT_AMBULATORY_CARE_PROVIDER_SITE_OTHER): Payer: 59 | Admitting: Podiatry

## 2017-07-14 ENCOUNTER — Encounter: Payer: Self-pay | Admitting: Podiatry

## 2017-07-14 ENCOUNTER — Ambulatory Visit (INDEPENDENT_AMBULATORY_CARE_PROVIDER_SITE_OTHER): Payer: 59

## 2017-07-14 DIAGNOSIS — S8264XD Nondisplaced fracture of lateral malleolus of right fibula, subsequent encounter for closed fracture with routine healing: Secondary | ICD-10-CM | POA: Diagnosis not present

## 2017-07-14 DIAGNOSIS — S92324D Nondisplaced fracture of second metatarsal bone, right foot, subsequent encounter for fracture with routine healing: Secondary | ICD-10-CM

## 2017-07-15 ENCOUNTER — Ambulatory Visit: Payer: 59 | Admitting: Podiatry

## 2017-07-15 NOTE — Progress Notes (Signed)
   Subjective:    Patient ID: Taylor [name], female    DOB: [birth year], 31 y.o.   MRN: [id number]  HPI Chief Complaint  Patient presents with  . Ankle Injury    fu right ankle fracture     31 y.o. female returns for the above complaint. Reports pain is gone down since last visit. Reports more pain in her foot than her ankle. Ports compliance with her boot.    Review of Systems     Objective:   Physical Exam  General AA&O x3. Normal mood and affect.  Vascular Dorsalis pedis and posterior tibial pulses 2/4 bilat. Brisk capillary refill to all digits. Pedal hair present.  Neurologic Epicritic sensation grossly intact.  Dermatologic No open lesions. Interspaces clear of maceration. Nails well groomed and normal in appearance.  Orthopedic: MMT 5/5 in dorsiflexion, plantarflexion, inversion, and eversion. Normal joint ROM without pain or crepitus. Tenderness to palpation right fibula  Tenderness to palpation right second metatarsal    Radiographs: Taken and reviewed no change in alignment no syndesmotic widening of the ankle. No Lisfranc diastasis.    Assessment & Plan:  Patient was evaluated and treated all questions answered  Right fibular fracture, R 2nd Met base fx -XR reviewed as above -Continue WBAT in boot. -F/u in 2 week for new XR. -Discussed being out of work for 2 more weeks due to injury as she cannot stand all shift.

## 2017-07-21 ENCOUNTER — Telehealth: Payer: Self-pay | Admitting: Podiatry

## 2017-07-21 NOTE — Telephone Encounter (Signed)
Patient's employer can facilitate her performing sedentary work and only working Monday, Wednesday and Friday.  Would she be cleared by you to do this?

## 2017-07-22 ENCOUNTER — Encounter: Payer: Self-pay | Admitting: Podiatry

## 2017-07-22 NOTE — Telephone Encounter (Signed)
Patient notified

## 2017-07-22 NOTE — Telephone Encounter (Signed)
That would be fine. Please inform. Thanks!

## 2017-07-30 ENCOUNTER — Telehealth: Payer: 59 | Admitting: Family

## 2017-07-30 DIAGNOSIS — H04301 Unspecified dacryocystitis of right lacrimal passage: Secondary | ICD-10-CM | POA: Diagnosis not present

## 2017-07-30 MED ORDER — CLINDAMYCIN HCL 300 MG PO CAPS: 300.0000 mg | ORAL_CAPSULE | Freq: Three times a day (TID) | ORAL | 0 refills | Status: DC

## 2017-07-30 NOTE — Progress Notes (Signed)
We are sorry that you are not feeling well.  Here is how we plan to help!  Based on what you have shared with me it looks like you have infection of your tear duct. I have sent in Clindaymycin to your pharmacy.   It is typically spread through direct contact with secretions, or contaminated objects or surfaces that one may have touched.  Strict handwashing is suggested with soap and water is urged.  If not available, use alcohol based had sanitizer.  Avoid unnecessary touching of the eye.  If you wear contact lenses, you will need to refrain from wearing them until you see no white discharge from the eye for at least 24 hours after being on medication.  You should see symptom improvement in 1-2 days after starting the medication regimen.  Call us if symptoms are not improved in 1-2 days.  Home Care:  Wash your hands often!  Do not wear your contacts until you complete your treatment plan.  Avoid sharing towels, bed linen, personal items with a person who has pink eye.  See attention for anyone in your home with similar symptoms.  Get Help Right Away If:  Your symptoms do not improve.  You develop blurred or loss of vision.  Your symptoms worsen (increased discharge, pain or redness)  Your e-visit answers were reviewed by a board certified advanced clinical practitioner to complete your personal care plan.  Depending on the condition, your plan could have included both over the counter or prescription medications.  If there is a problem please reply  once you have received a response from your provider.  Your safety is important to us.  If you have drug allergies check your prescription carefully.    You can use MyChart to ask questions about today's visit, request a non-urgent call back, or ask for a work or school excuse for 24 hours related to this e-Visit. If it has been greater than 24 hours you will need to follow up with your provider, or enter a new e-Visit to address those  concerns.   You will get an e-mail in the next two days asking about your experience.  I hope that your e-visit has been valuable and will speed your recovery. Thank you for using e-visits.

## 2017-08-04 ENCOUNTER — Ambulatory Visit: Payer: 59 | Admitting: Podiatry

## 2017-08-11 ENCOUNTER — Encounter: Payer: Self-pay | Admitting: Podiatry

## 2017-08-11 ENCOUNTER — Ambulatory Visit (INDEPENDENT_AMBULATORY_CARE_PROVIDER_SITE_OTHER): Payer: 59 | Admitting: Podiatry

## 2017-08-11 ENCOUNTER — Encounter: Payer: 59 | Attending: Surgery | Admitting: Registered"

## 2017-08-11 ENCOUNTER — Ambulatory Visit (INDEPENDENT_AMBULATORY_CARE_PROVIDER_SITE_OTHER): Payer: 59

## 2017-08-11 DIAGNOSIS — I10 Essential (primary) hypertension: Secondary | ICD-10-CM | POA: Insufficient documentation

## 2017-08-11 DIAGNOSIS — Z713 Dietary counseling and surveillance: Secondary | ICD-10-CM | POA: Diagnosis not present

## 2017-08-11 DIAGNOSIS — E079 Disorder of thyroid, unspecified: Secondary | ICD-10-CM | POA: Insufficient documentation

## 2017-08-11 DIAGNOSIS — S92324D Nondisplaced fracture of second metatarsal bone, right foot, subsequent encounter for fracture with routine healing: Secondary | ICD-10-CM | POA: Diagnosis not present

## 2017-08-11 DIAGNOSIS — Z79899 Other long term (current) drug therapy: Secondary | ICD-10-CM | POA: Diagnosis not present

## 2017-08-11 DIAGNOSIS — S8264XD Nondisplaced fracture of lateral malleolus of right fibula, subsequent encounter for closed fracture with routine healing: Secondary | ICD-10-CM

## 2017-08-11 DIAGNOSIS — Z6841 Body Mass Index (BMI) 40.0 and over, adult: Secondary | ICD-10-CM | POA: Diagnosis not present

## 2017-08-11 DIAGNOSIS — Z9889 Other specified postprocedural states: Secondary | ICD-10-CM

## 2017-08-11 DIAGNOSIS — Z87891 Personal history of nicotine dependence: Secondary | ICD-10-CM | POA: Diagnosis not present

## 2017-08-11 DIAGNOSIS — F419 Anxiety disorder, unspecified: Secondary | ICD-10-CM | POA: Diagnosis not present

## 2017-08-11 DIAGNOSIS — E669 Obesity, unspecified: Secondary | ICD-10-CM

## 2017-08-12 NOTE — Progress Notes (Signed)
  Subjective:  Patient ID: Taylor [name], female    DOB: 02/22/1986,  MRN: [id number]  Chief Complaint  Patient presents with  . Foot Injury    fu fx right foot and ankle   still a lot of pain across the top of the foot and in the arch    31 y.o. female returns for the above complaint. Reports continued pain across the top of the foot. Not having pain in the ankle at this time.  Objective:  There were no vitals filed for this visit. General AA&O x3. Normal mood and affect.  Vascular Pedal pulses palpable.  Neurologic Epicritic sensation grossly intact.  Dermatologic No open lesions. Skin normal texture and turgor.  Orthopedic: Pain on palpation R dorsal foot. No pain with 2nd piano key test. No pain on palpation of the fibula.   Radiographs: family fracture noted with small callus formation fracture line still evident. Metatarsal base fractures healing without significant callus formation overall alignment maintained.  Assessment & Plan:  Patient was evaluated and treated and all questions answered.  Status post R fibular and 2nd metatarsal base fxs -XR reviewed as above. -Not fully healed. -Continue WBAT in CAM boot.  F/u in 2 weeks with new XRs.  Return in about 2 weeks (around 08/25/2017) for fracture f/u.

## 2017-08-12 NOTE — Progress Notes (Signed)
  Pre-Operative Nutrition Class:  Appt start time: 8:15   End time:  9:15.  Patient was seen on 08/11/2017 for Pre-Operative Bariatric Surgery Education [facility]   Surgery date: TBD Surgery type: Sleeve gastrectomy Start weight at [facility]: 356.0 Weight today: 359.0   Samples given per MNT protocol. Patient educated on appropriate usage: Bariatric Advantage Multivitamin Lot # [id number] Exp: 04/2018  Bariatric Advantage Calcium Citrate (chocolate) Lot # [id number] Exp: 07/17/2018  Bariatric Advantage Calcium Citrate (lemon) Lot # [id number] Exp: 06/26/2018  Renee Pain Protein Shake Lot # [id number] Exp: 03/08/2018   The following the learning objectives were met by the patient during this course:  Identify Pre-Op Dietary Goals and will begin 2 weeks pre-operatively  Identify appropriate sources of fluids and proteins   State protein recommendations and appropriate sources pre and post-operatively  Identify Post-Operative Dietary Goals and will follow for 2 weeks post-operatively  Identify appropriate multivitamin and calcium sources  Describe the need for physical activity post-operatively and will follow MD recommendations  State when to call healthcare provider regarding medication questions or post-operative complications  Handouts given during class include:  Pre-Op Bariatric Surgery Diet Handout  Protein Shake Handout  Post-Op Bariatric Surgery Nutrition Handout  BELT Program Information Flyer  Support Group Information Flyer  [facility] Bariatric Supplements Price List  Follow-Up Plan: Patient will follow-up at [place] 2 weeks post operatively for diet advancement per MD.

## 2017-08-20 ENCOUNTER — Ambulatory Visit
Admission: RE | Admit: 2017-08-20 | Discharge: 2017-08-20 | Disposition: A | Discharge status: Home / Self Care | Payer: 59 | Source: Ambulatory Visit | Attending: Surgery | Admitting: Surgery

## 2017-08-20 DIAGNOSIS — R918 Other nonspecific abnormal finding of lung field: Secondary | ICD-10-CM | POA: Diagnosis not present

## 2017-08-20 DIAGNOSIS — K219 Gastro-esophageal reflux disease without esophagitis: Secondary | ICD-10-CM | POA: Insufficient documentation

## 2017-08-20 IMAGING — CR DG CHEST 2V
1 series · 2 of 2 positions shown · non-contrast
Comparison: [DATE]

CLINICAL DATA: Preoperative evaluation for bariatric surgery.
Morbid obesity. Hypertension.

EXAM:
CHEST  2 VIEW

[Series 1: dg chest 2 view · 0.14mm/px · 2 of 2 slices shown]
[im 1/2]
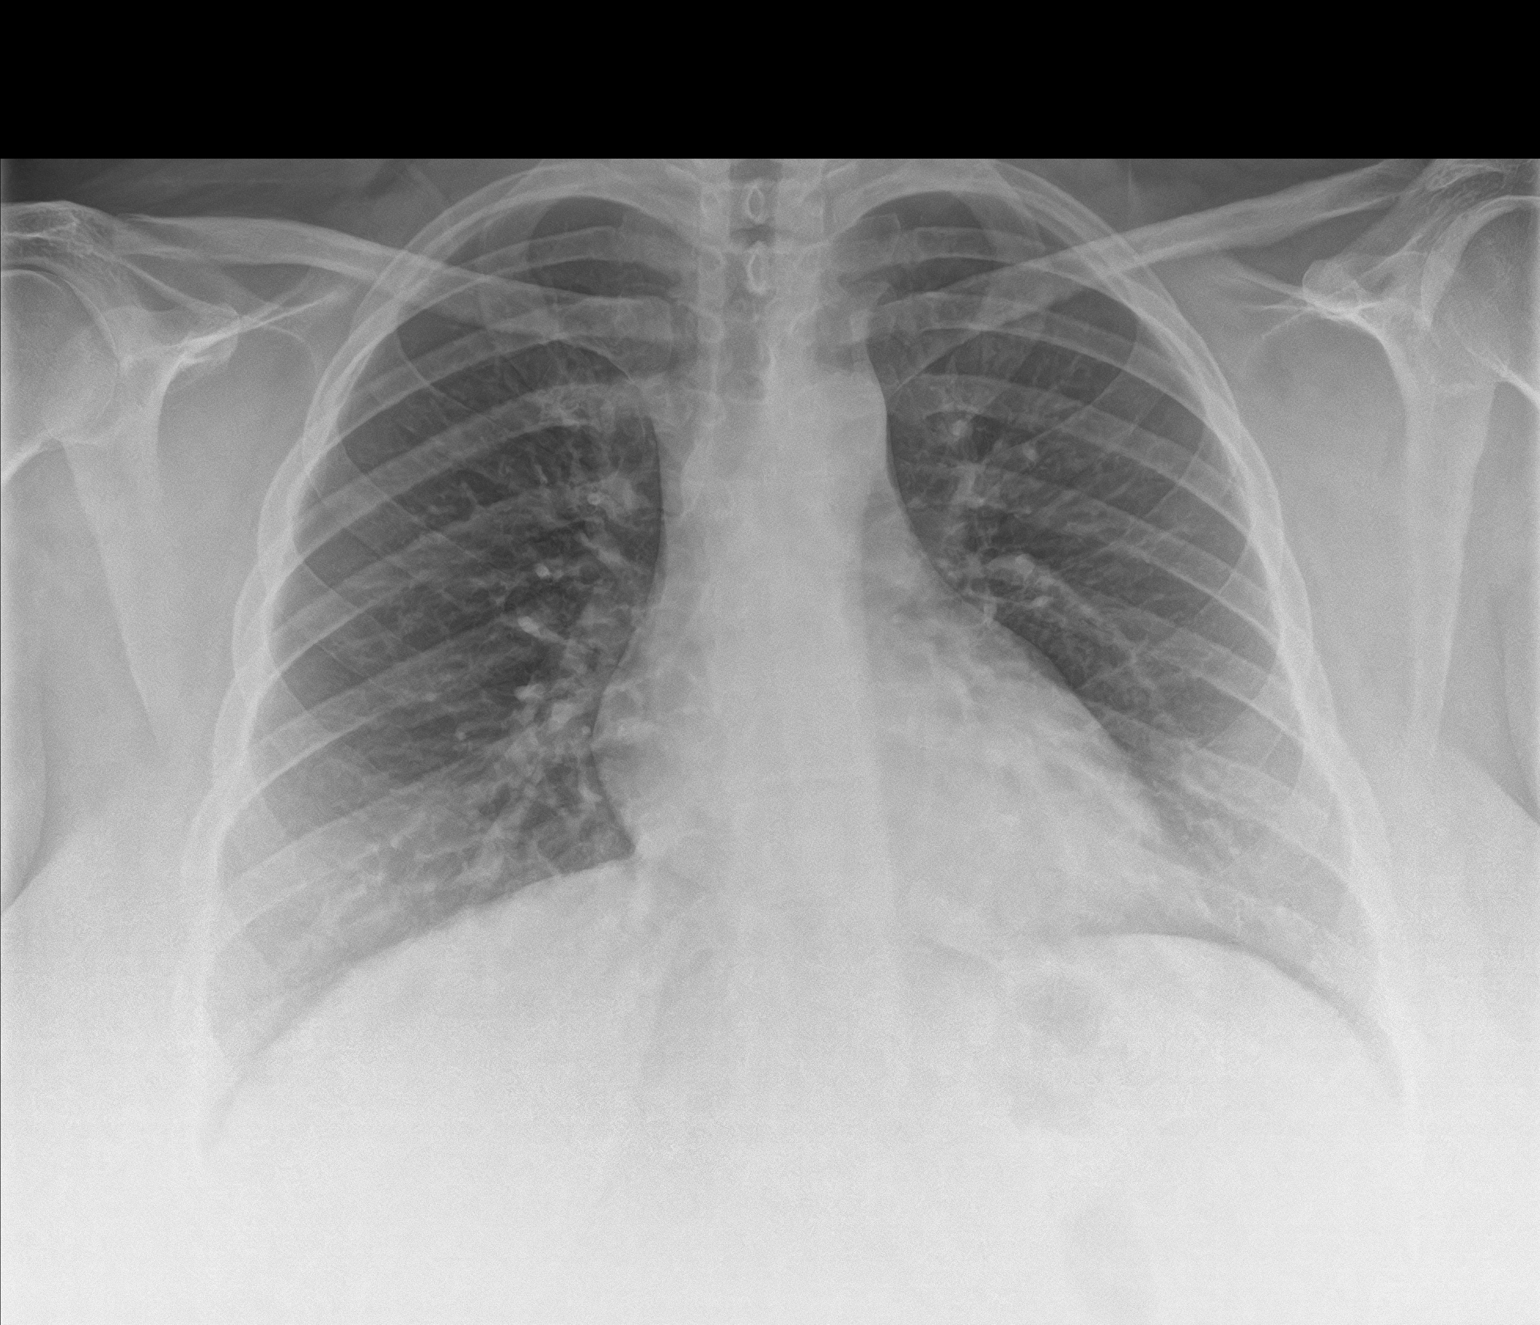
[im 2/2]
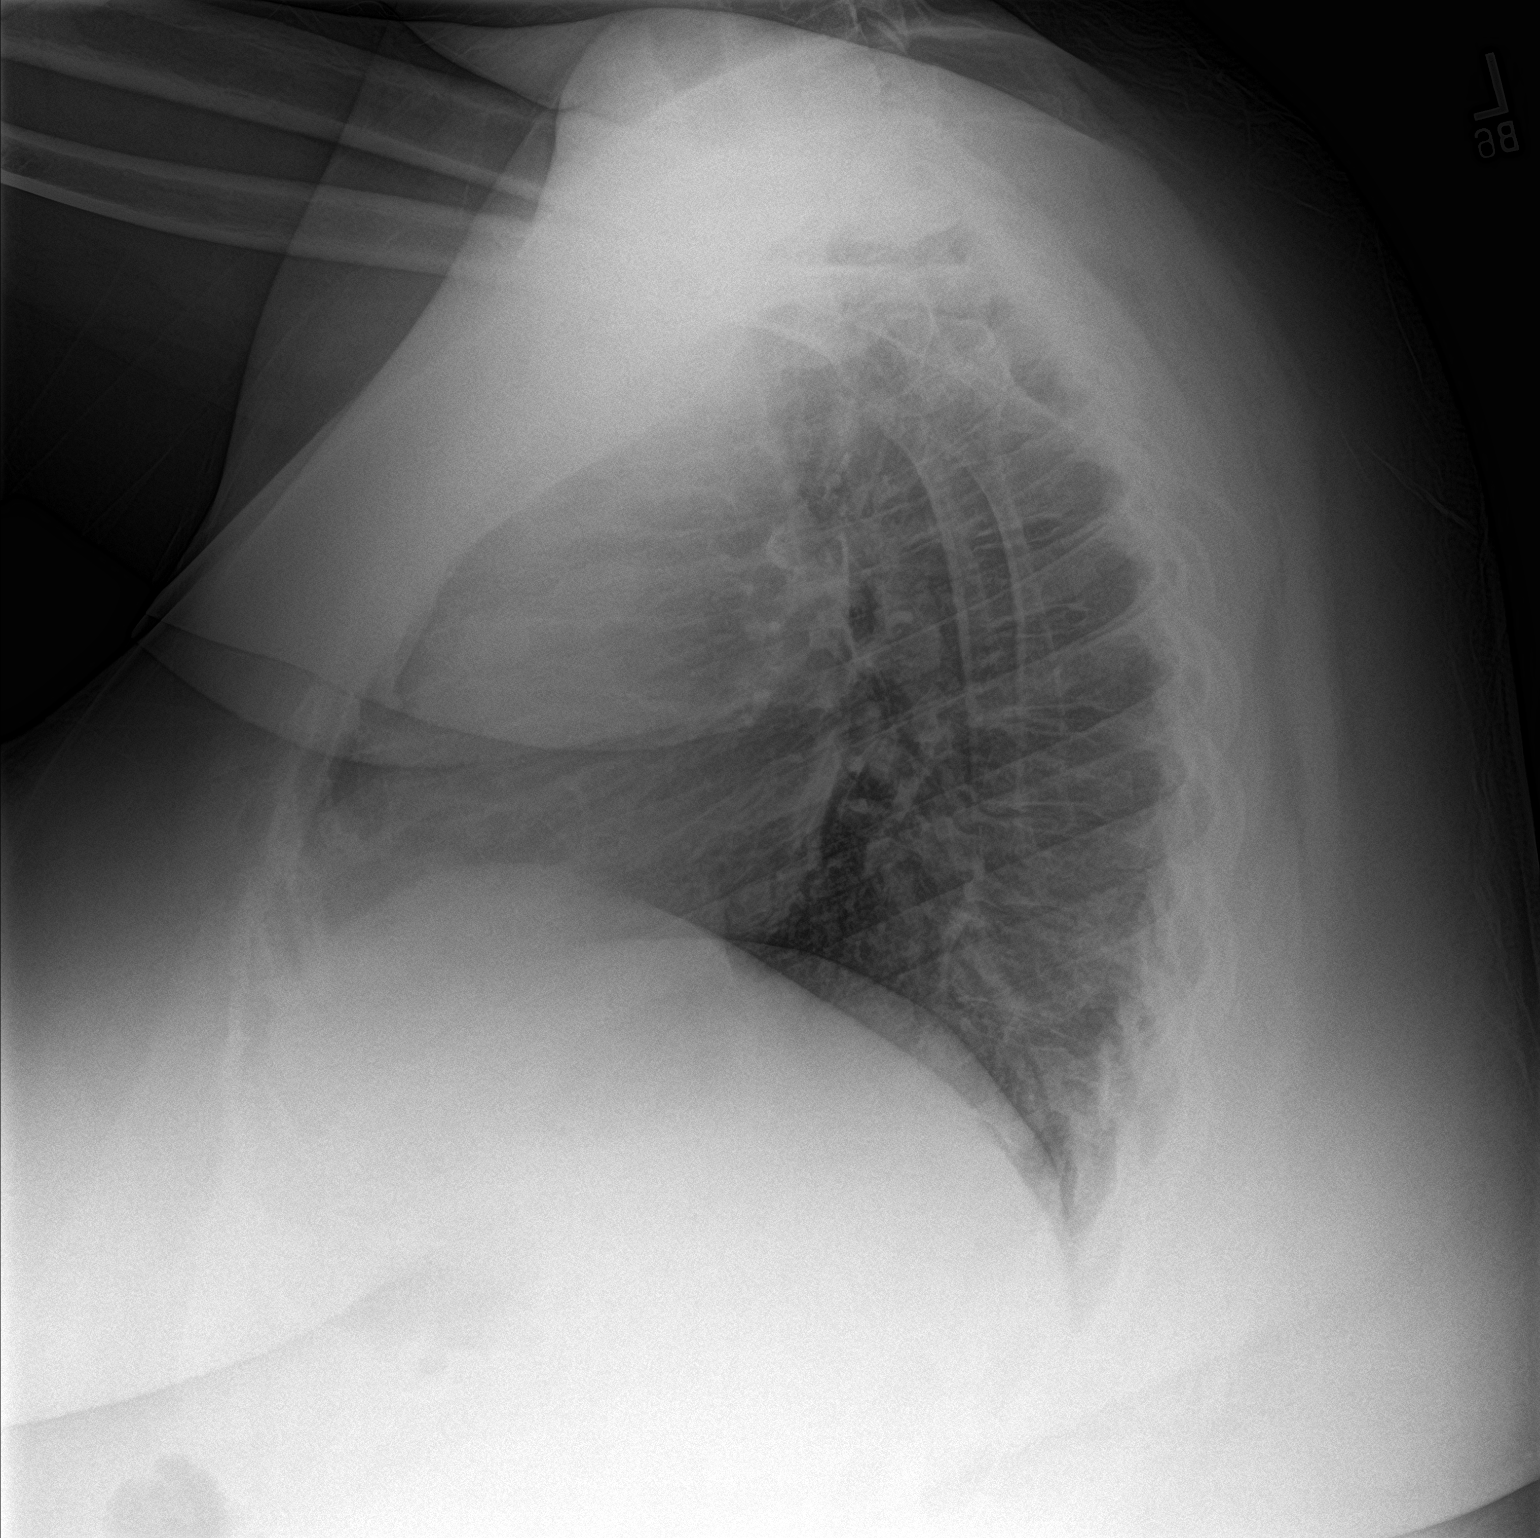

[2 of 2 positions shown; findings below may reference images not displayed]

FINDINGS: Lungs are clear. The heart size and pulmonary vascularity are
normal. No adenopathy. No evident bone lesions.
IMPRESSION: No edema or consolidation.

## 2017-08-20 IMAGING — RF DG UGI W/ KUB
9 of 15 series · 13 of 21 positions shown · non-contrast
Comparison: None.

CLINICAL DATA: Obesity.  Preop gastric sleeve procedure.

EXAM:
UPPER GI SERIES WITH KUB
TECHNIQUE: After obtaining a scout radiograph a routine upper GI series was
performed using thin and high density barium.
FLUOROSCOPY TIME:  Fluoroscopy Time:  0.7 minutes
Radiation Exposure Index (if provided by the fluoroscopic device):
68.2 mGy
Number of Acquired Spot Images: 6

[Series 1: t abdomen supine · 0.15mm/px · 1 of 1 slices shown]
[im 1/1]
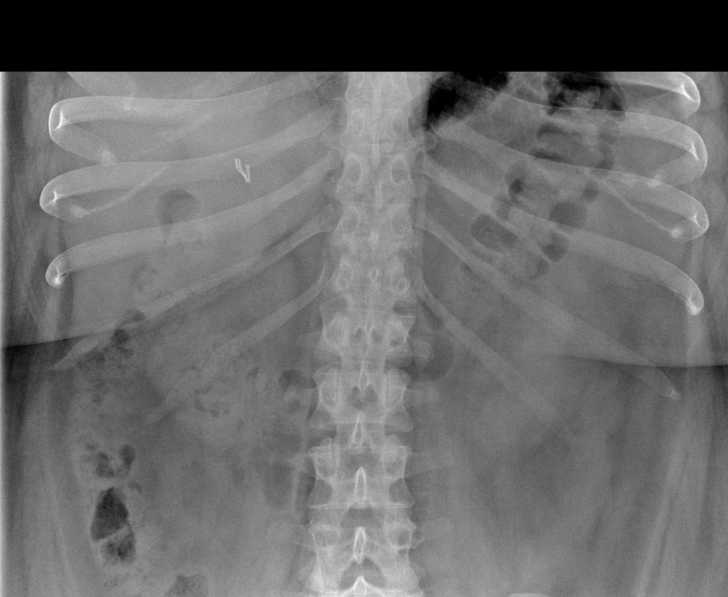

[Series 3: cp_standard · 0.51mm/px · 3 of 33 frames shown (1 of 5)]
[frame 5/33]
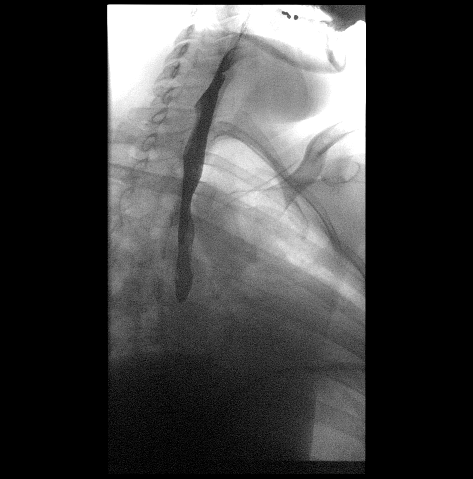
[frame 17/33]
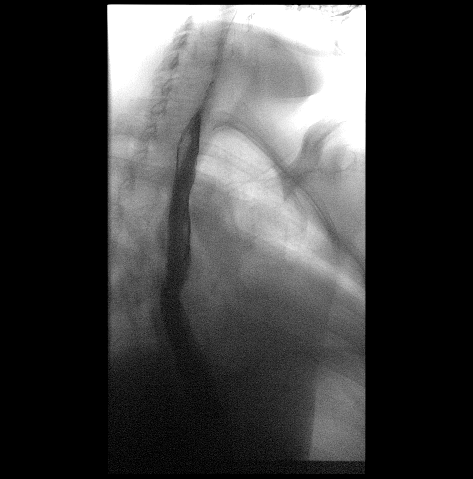
[frame 29/33]
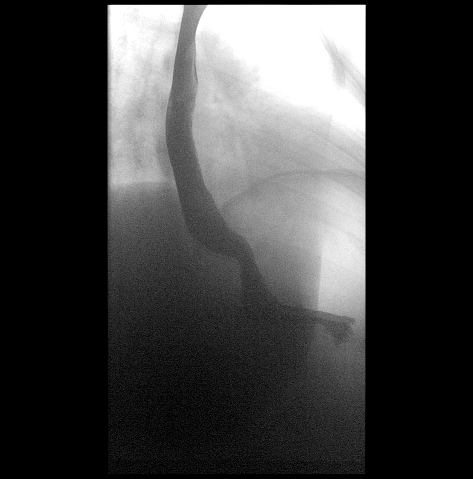

[Series 5: cp_standard · 0.18mm/px · 1 of 1 slices shown (2 of 5)]
[im 1/1]
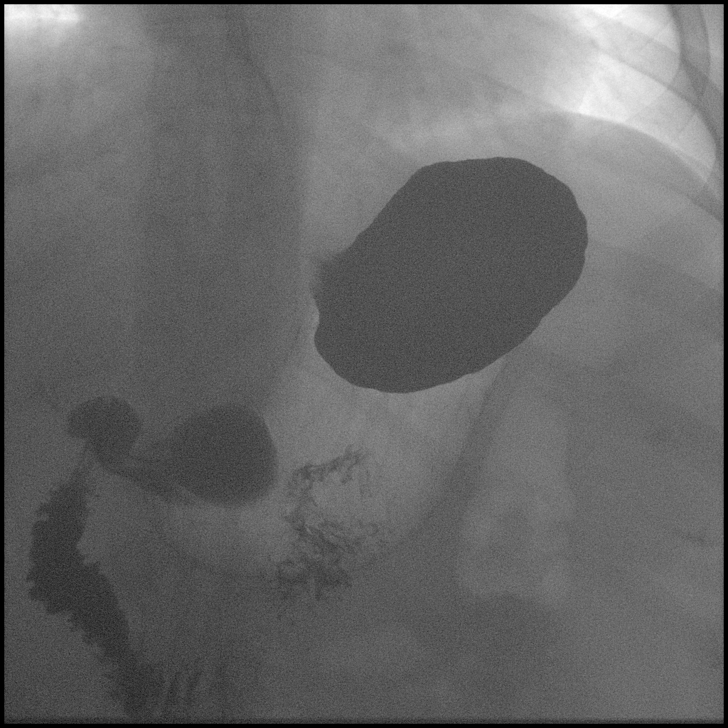

[Series 6: cp_standard · 0.18mm/px · 1 of 1 slices shown (3 of 5)]
[im 1/1]
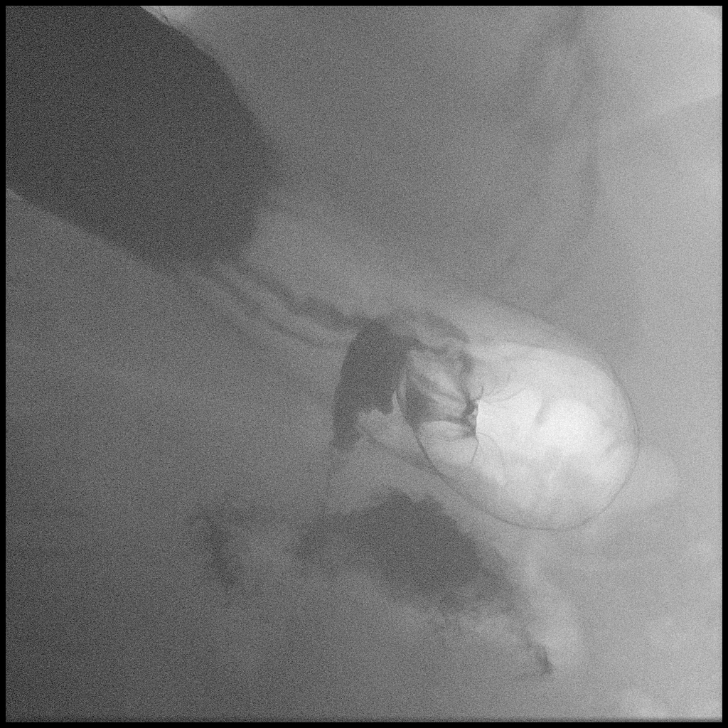

[Series 8: fluoro_barium singleshot_bw · 0.18mm/px · 1 of 1 slices shown (1 of 3)]
[im 1/1]
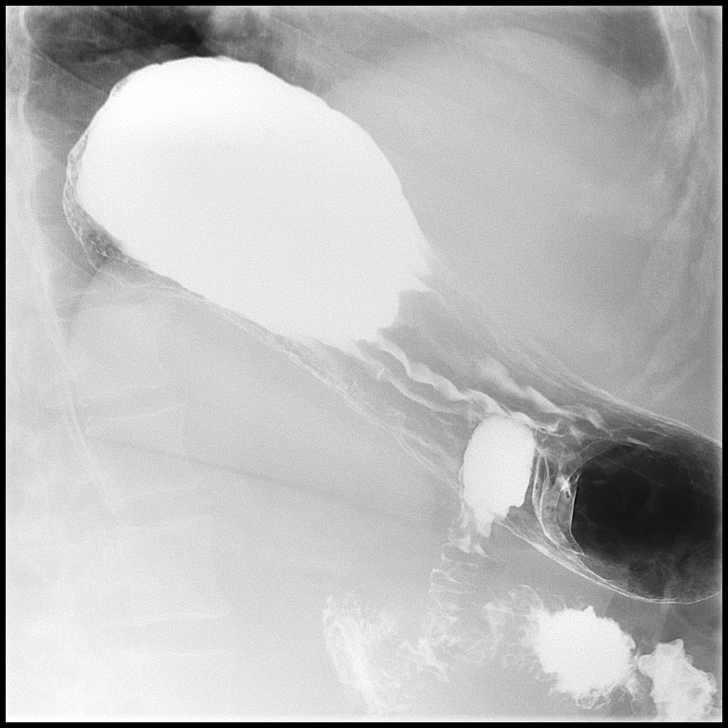

[Series 10: fluoro_barium singleshot_bw · 0.18mm/px · 1 of 1 slices shown (2 of 3)]
[im 1/1]
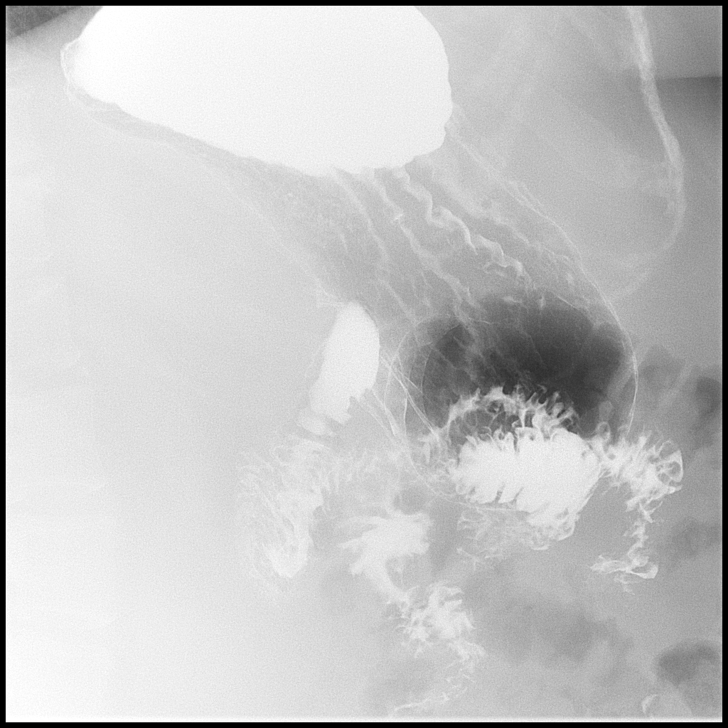

[Series 11: fluoro_barium singleshot_bw · 0.18mm/px · 1 of 1 slices shown (3 of 3)]
[im 1/1]
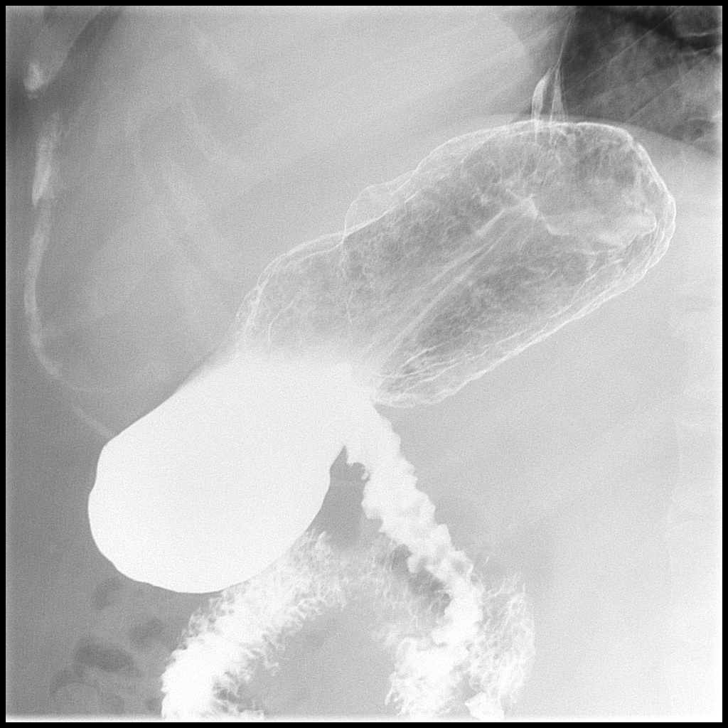

[Series 13: cp_standard · 0.53mm/px · 3 of 20 frames shown (4 of 5)]
[frame 4/20]
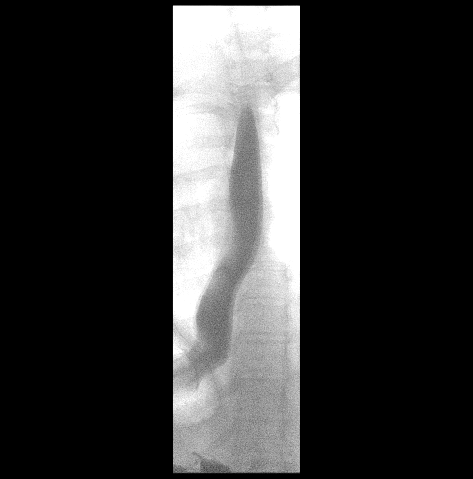
[frame 7/20]
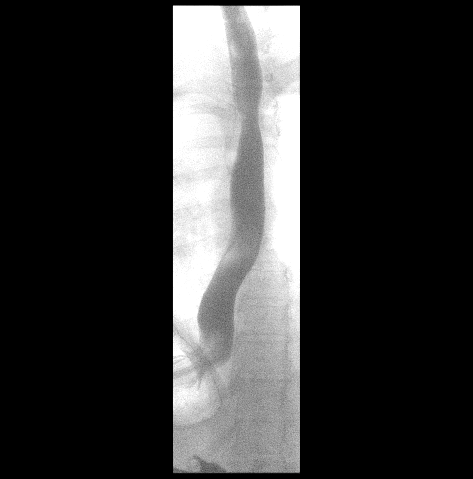
[frame 18/20]
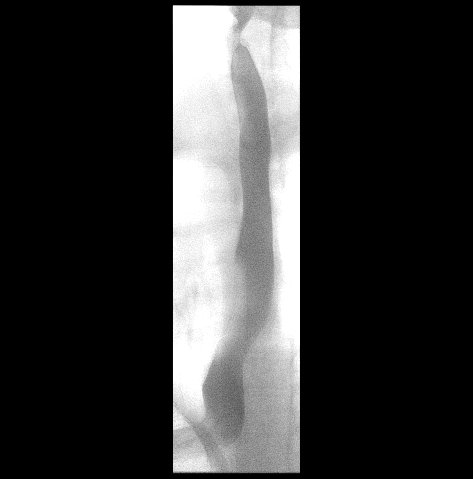

[Series 15: cp_standard · 0.27mm/px · 1 of 1 slices shown (5 of 5)]
[im 1/1]
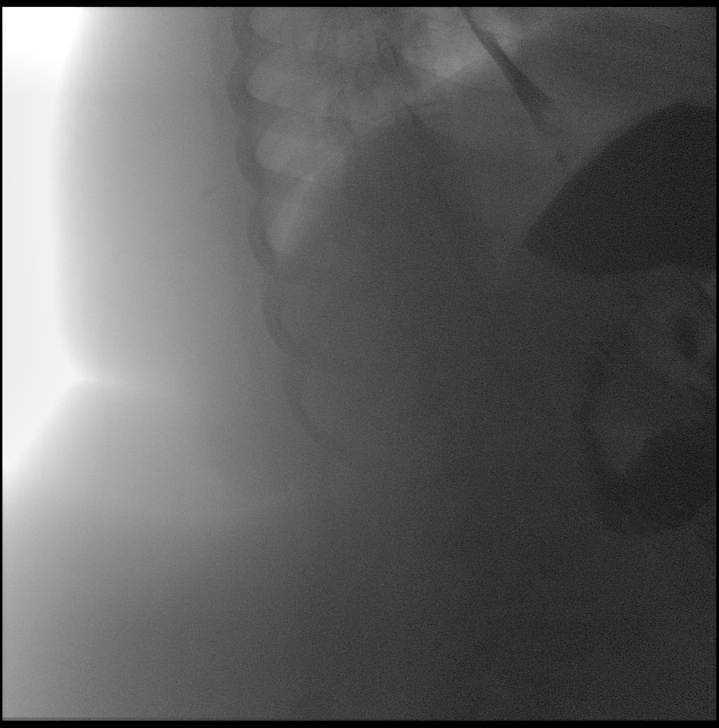

[13 of 21 positions shown; findings below may reference images not displayed]

FINDINGS: KUB:

There is no bowel dilatation to suggest obstruction. There is no
evidence of pneumoperitoneum, portal venous gas or pneumatosis.

There are no pathologic calcifications along the expected course of
the ureters. There is a T-type IUD noted in the pelvis.

The osseous structures are unremarkable.

UPPER GI:

Examination of the esophagus demonstrated normal esophageal
motility. Normal esophageal morphology without evidence of
esophagitis or ulceration. No esophageal stricture, diverticula, or
mass lesion. No evidence of hiatal hernia. Mild spontaneous
gastroesophageal reflux.

Examination of the stomach demonstrated normal rugal folds and areae
gastricae. The gastric mucosa appeared unremarkable without evidence
of ulceration, scarring, or mass lesion. Gastric motility and
emptying was normal. Fluoroscopic examination of the duodenum
demonstrates normal motility and morphology without evidence of
ulceration or mass lesion.
IMPRESSION: 1. Mild spontaneous gastroesophageal reflux.
2. Otherwise normal upper GI.

## 2017-08-26 ENCOUNTER — Ambulatory Visit (INDEPENDENT_AMBULATORY_CARE_PROVIDER_SITE_OTHER): Payer: 59

## 2017-08-26 ENCOUNTER — Ambulatory Visit: Payer: 59 | Admitting: Podiatry

## 2017-08-26 DIAGNOSIS — S8264XD Nondisplaced fracture of lateral malleolus of right fibula, subsequent encounter for closed fracture with routine healing: Secondary | ICD-10-CM | POA: Diagnosis not present

## 2017-08-26 DIAGNOSIS — S92324D Nondisplaced fracture of second metatarsal bone, right foot, subsequent encounter for fracture with routine healing: Secondary | ICD-10-CM | POA: Diagnosis not present

## 2017-09-07 NOTE — Progress Notes (Signed)
  Subjective:  Patient ID: Taylor [name], female    DOB: 08/29/1986,  MRN: [id number]  Chief Complaint  Patient presents with  . Ankle Injury    fu fx ankle and foot  no longer any pain    31 y.o. female returns for the above complaint.  Not having any pain in the right foot  Objective:  There were no vitals filed for this visit. General AA&O x3. Normal mood and affect.  Vascular Pedal pulses palpable.  Neurologic Epicritic sensation grossly intact.  Dermatologic No open lesions. Skin normal texture and turgor.  Orthopedic: No pain to palpation at right midfoot or at the right fibula   Radiograph taken and reviewed: Right fibula with callus formation.  Metatarsal base healing well  Assessment & Plan:  Patient was evaluated and treated and all questions answered.  Status post right fibular and second metatarsal base fractures -X-rays reviewed as above -No pain on exam. -Advised to transition out of cam boot  Follow-up in 6 weeks for new x-rays

## 2017-09-08 ENCOUNTER — Ambulatory Visit: Payer: 59 | Admitting: Podiatry

## 2017-09-10 ENCOUNTER — Other Ambulatory Visit: Payer: Self-pay | Admitting: Surgery

## 2017-09-10 ENCOUNTER — Ambulatory Visit: Payer: Self-pay | Admitting: Surgery

## 2017-09-11 NOTE — Patient Instructions (Signed)
Taylor RaymondRebecca A [name]  09/11/2017   Your procedure is scheduled on: 09/16/2017    Report [facility] Main  Entrance [place]  elevators to 3rd floor to  Short Stay Center at   0800 AM.    Call this number if you have problems the morning of surgery [phone]    Remember: ONLY 1 PERSON MAY GO WITH YOU TO SHORT STAY TO GET  READY MORNING OF YOUR SURGERY.  Do not eat food or drink liquids :After Midnight.     Take these medicines the morning of surgery with A SIP OF WATER: Synthroid, Prozac                                 You may not have any metal on your body including hair pins and              piercings  Do not wear jewelry, make-up, lotions, powders or perfumes, deodorant             Do not wear nail polish.  Do not shave  48 hours prior to surgery.                Do not bring valuables to the hospital. [facility] IS NOT             RESPONSIBLE   FOR VALUABLES.  Contacts, dentures or bridgework may not be worn into surgery.  Leave suitcase in the car. After surgery it may be brought to your room.                       Please read over the following fact sheets you were given: _____________________________________________________________________             [facility] - Preparing for Surgery Before surgery, you can play an important role.  Because skin is not sterile, your skin needs to be as free of germs as possible.  You can reduce the number of germs on your skin by washing with CHG (chlorahexidine gluconate) soap before surgery.  CHG is an antiseptic cleaner which kills germs and bonds with the skin to continue killing germs even after washing. Please DO NOT use if you have an allergy to CHG or antibacterial soaps.  If your skin becomes reddened/irritated stop using the CHG and inform your nurse when you arrive at Short Stay. Do not shave (including legs and underarms) for at least 48 hours prior to the first CHG shower.  You may shave your  face/neck. Please follow these instructions carefully:  1.  Shower with CHG Soap the night before surgery and the  morning of Surgery.  2.  If you choose to wash your hair, wash your hair first as usual with your  normal  shampoo.  3.  After you shampoo, rinse your hair and body thoroughly to remove the  shampoo.                           4.  Use CHG as you would any other liquid soap.  You can apply chg directly  to the skin and wash                       Gently with a scrungie or clean  washcloth.  5.  Apply the CHG Soap to your body ONLY FROM THE NECK DOWN.   Do not use on face/ open                           Wound or open sores. Avoid contact with eyes, ears mouth and genitals (private parts).                       Wash face,  Genitals (private parts) with your normal soap.             6.  Wash thoroughly, paying special attention to the area where your surgery  will be performed.  7.  Thoroughly rinse your body with warm water from the neck down.  8.  DO NOT shower/wash with your normal soap after using and rinsing off  the CHG Soap.                9.  Pat yourself dry with a clean towel.            10.  Wear clean pajamas.            11.  Place clean sheets on your bed the night of your first shower and do not  sleep with pets. Day of Surgery : Do not apply any lotions/deodorants the morning of surgery.  Please wear clean clothes to the hospital/surgery center.  FAILURE TO FOLLOW THESE INSTRUCTIONS MAY RESULT IN THE CANCELLATION OF YOUR SURGERY PATIENT SIGNATURE_________________________________  NURSE SIGNATURE__________________________________  ________________________________________________________________________   Adam Phenix  An incentive spirometer is a tool that can help keep your lungs clear and active. This tool measures how well you are filling your lungs with each breath. Taking long deep breaths may help reverse or decrease the chance of developing breathing  (pulmonary) problems (especially infection) following:  A long period of time when you are unable to move or be active. BEFORE THE PROCEDURE   If the spirometer includes an indicator to show your best effort, your nurse or respiratory therapist will set it to a desired goal.  If possible, sit up straight or lean slightly forward. Try not to slouch.  Hold the incentive spirometer in an upright position. INSTRUCTIONS FOR USE  1. Sit on the edge of your bed if possible, or sit up as far as you can in bed or on a chair. 2. Hold the incentive spirometer in an upright position. 3. Breathe out normally. 4. Place the mouthpiece in your mouth and seal your lips tightly around it. 5. Breathe in slowly and as deeply as possible, raising the piston or the ball toward the top of the column. 6. Hold your breath for 3-5 seconds or for as long as possible. Allow the piston or ball to fall to the bottom of the column. 7. Remove the mouthpiece from your mouth and breathe out normally. 8. Rest for a few seconds and repeat Steps 1 through 7 at least 10 times every 1-2 hours when you are awake. Take your time and take a few normal breaths between deep breaths. 9. The spirometer may include an indicator to show your best effort. Use the indicator as a goal to work toward during each repetition. 10. After each set of 10 deep breaths, practice coughing to be sure your lungs are clear. If you have an incision (the cut made at the time of surgery), support your incision when coughing by  placing a pillow or rolled up towels firmly against it. Once you are able to get out of bed, walk around indoors and cough well. You may stop using the incentive spirometer when instructed by your caregiver.  RISKS AND COMPLICATIONS  Take your time so you do not get dizzy or light-headed.  If you are in pain, you may need to take or ask for pain medication before doing incentive spirometry. It is harder to take a deep breath if you  are having pain. AFTER USE  Rest and breathe slowly and easily.  It can be helpful to keep track of a log of your progress. Your caregiver can provide you with a simple table to help with this. If you are using the spirometer at home, follow these instructions: [place] IF:   You are having difficultly using the spirometer.  You have trouble using the spirometer as often as instructed.  Your pain medication is not giving enough relief while using the spirometer.  You develop fever of 100.5 F (38.1 C) or higher. SEEK IMMEDIATE MEDICAL CARE IF:   You cough up bloody sputum that had not been present before.  You develop fever of 102 F (38.9 C) or greater.  You develop worsening pain at or near the incision site. MAKE SURE YOU:   Understand these instructions.  Will watch your condition.  Will get help right away if you are not doing well or get worse. Document Released: 02/10/2007 Document Revised: 12/23/2011 Document Reviewed: 04/13/2007 [facility] Patient Information 2014 [place], [place].   ________________________________________________________________________

## 2017-09-12 ENCOUNTER — Other Ambulatory Visit: Payer: Self-pay | Admitting: *Deleted

## 2017-09-12 ENCOUNTER — Other Ambulatory Visit: Payer: Self-pay

## 2017-09-12 ENCOUNTER — Encounter (HOSPITAL_COMMUNITY): Payer: Self-pay

## 2017-09-12 ENCOUNTER — Encounter (HOSPITAL_COMMUNITY)
Admission: RE | Admit: 2017-09-12 | Discharge: 2017-09-12 | Disposition: A | Discharge status: Home / Self Care | Payer: 59 | Source: Ambulatory Visit | Attending: Surgery | Admitting: Surgery

## 2017-09-12 DIAGNOSIS — Z01818 Encounter for other preprocedural examination: Secondary | ICD-10-CM | POA: Insufficient documentation

## 2017-09-12 DIAGNOSIS — Z01812 Encounter for preprocedural laboratory examination: Secondary | ICD-10-CM | POA: Insufficient documentation

## 2017-09-12 DIAGNOSIS — Z6841 Body Mass Index (BMI) 40.0 and over, adult: Secondary | ICD-10-CM | POA: Insufficient documentation

## 2017-09-12 DIAGNOSIS — E669 Obesity, unspecified: Secondary | ICD-10-CM | POA: Diagnosis not present

## 2017-09-12 HISTORY — DX: Gastro-esophageal reflux disease without esophagitis: K21.9

## 2017-09-12 LAB — COMPREHENSIVE METABOLIC PANEL
ALBUMIN: 3.5 g/dL (ref 3.5–5.0)
ALK PHOS: 73 U/L (ref 38–126)
ALT: 29 U/L (ref 14–54)
ANION GAP: 6 (ref 5–15)
AST: 22 U/L (ref 15–41)
BILIRUBIN TOTAL: 0.3 mg/dL (ref 0.3–1.2)
BUN: 14 mg/dL (ref 6–20)
CALCIUM: 8.8 mg/dL — AB (ref 8.9–10.3)
CO2: 26 mmol/L (ref 22–32)
CREATININE: 0.62 mg/dL (ref 0.44–1.00)
Chloride: 105 mmol/L (ref 101–111)
GFR calc Af Amer: >60 mL/min (ref >60)
GFR calc non Af Amer: >60 mL/min (ref >60)
GLUCOSE: 101 mg/dL — AB (ref 65–99)
Potassium: 4.1 mmol/L (ref 3.5–5.1)
Sodium: 137 mmol/L (ref 135–145)
TOTAL PROTEIN: 7.2 g/dL (ref 6.5–8.1)

## 2017-09-12 LAB — CBC WITH DIFFERENTIAL/PLATELET
BASOS PCT: 0 %
Basophils Absolute: 0 10*3/uL (ref 0.0–0.1)
Eosinophils Absolute: 0.1 10*3/uL (ref 0.0–0.7)
Eosinophils Relative: 2 %
HEMATOCRIT: 37.1 % (ref 36.0–46.0)
HEMOGLOBIN: 11.9 g/dL — AB (ref 12.0–15.0)
LYMPHS ABS: 1.5 10*3/uL (ref 0.7–4.0)
Lymphocytes Relative: 25 %
MCH: 27.8 pg (ref 26.0–34.0)
MCHC: 32.1 g/dL (ref 30.0–36.0)
MCV: 86.7 fL (ref 78.0–100.0)
MONOS PCT: 6 %
Monocytes Absolute: 0.4 10*3/uL (ref 0.1–1.0)
NEUTROS ABS: 4 10*3/uL (ref 1.7–7.7)
NEUTROS PCT: 67 %
Platelets: 305 10*3/uL (ref 150–400)
RBC: 4.28 MIL/uL (ref 3.87–5.11)
RDW: 14.6 % (ref 11.5–15.5)
WBC: 6 10*3/uL (ref 4.0–10.5)

## 2017-09-12 MED FILL — oxyCODONE HCL 5 MG/5ML SOLN: 5 | 2 days supply | Qty: 120 | Fill #0

## 2017-09-12 NOTE — Pre-Procedure Instructions (Signed)
Spoke with Taylor [name] in Forensic psychologistportable equipment request baribed.

## 2017-09-12 NOTE — Patient Outreach (Signed)
[facility] Care Management  09/12/2017  Tommie RaymondRebecca A Mcmurtrey 11/13/1985 [id number]   Subjective: Telephone call to patient's home  number, no answer, left HIPAA compliant voicemail message, and requested call back.    Objective: Per KPN (Knowledge Performance Now, point of care tool) and chart review,  Patient to be admitted on 09/16/17 for LAPAROSCOPIC GASTRIC SLEEVE RESECTION, [facility]O [facility].   Patient also has a history of hypertension and hypothyroidism.       Assessment: Received [facility] Preoperative / Transition of care referral on 09/12/17.    Preoperative call follow up pending patient contact.     Plan: RNCM will call patient for 2nd telephone outreach attempt, preoperative call follow up, within 10 business days if no return call.     Areg Bialas H. Gardiner Barefootooper RN, BSN, CCM [facility] Care Management [facility] Telephonic CM Phone: [phone] Fax: [phone]

## 2017-09-15 ENCOUNTER — Ambulatory Visit: Payer: Self-pay | Admitting: *Deleted

## 2017-09-15 NOTE — H&P (Signed)
Taylor [name] 09/10/2017 3:04 PM Location: [facility] Patient #: [id number] DOB: 05/15/1986 Single / [name] / Race: White Female   History of Present Illness Taylor [name]([name] B. Daphine DeutscherMartin MD; 09/10/2017 3:51 PM) The patient is a 31 year old female who presents for a bariatric surgery evaluation. The patient is a 31 year old female who presents for a bariatric surgery evaluation. [name] Lives in [place] and works at [employer]. She has completed our online seminar and is interested in a sleeve gastrectomy. She knows a couple patients that I have operated on that have done well. Her BMI is 58. She snores sometimes has no narcolepsy. She denies GERD and denies DVT. Her abdominal surgeries include C-section and a complicated laparoscopic cholecystectomy requiring a couple of drains and a six-day hospitalization.  We discussed laparoscopic sleeve gastrectomy in some detail including the risks benefits and complications. Workup includes an UGI that showed some spontaneous reflux but no hiatal hernia. She was called and notified of approval and is tentatively scheduled for next Tuesday, Dec 4th. She will begin a extremely low carb diet today. Orders placed in EPIC   Allergies Taylor [name](Taylor [name], CMA; 09/10/2017 3:05 PM) Keflex *CEPHALOSPORINS*   Medication History Taylor [name](Taylor [name], CMA; 09/10/2017 3:05 PM) FLUoxetine HCl (40MG  Capsule, Oral) Active. HydroCHLOROthiazide (25MG  Tablet, Oral) Active. Levothyroxine Sodium (75MCG Tablet, Oral) Active. ALPRAZolam (0.5MG  Tablet, Oral) Active. Medications Reconciled  Vitals (Taylor [name] CMA; 09/10/2017 3:05 PM) 09/10/2017 3:04 PM Weight: 361.8 lb Height: 65.75in Body Surface Area: 2.57 m Body Mass Index: 58.84 kg/m  Pulse: 54 (Regular)  BP: 126/80 (Sitting, Left Arm, Standard)  Physical Exam (Taylor [name] B. Daphine DeutscherMartin MD; 09/10/2017 3:53 PM) The physical exam findings are as follows: Note:Obese WF NAD HEENT unremarkable Chest  clear Heart SR without murmurs Abdomen nontender Ext obese but FROM Neuro alert and oriented x3    Assessment & Plan Taylor [name](Taylor [name] B. Daphine DeutscherMartin MD; 09/10/2017 3:54 PM) MORBID OBESITY, UNSPECIFIED OBESITY TYPE (E66.01)  Plan sleeve gastrectomy   Taylor B. Daphine DeutscherMartin, MD, FACS

## 2017-09-16 ENCOUNTER — Inpatient Hospital Stay (HOSPITAL_COMMUNITY)
Admission: RE | Admit: 2017-09-16 | Discharge: 2017-09-17 | DRG: 621 | Disposition: A | Discharge status: Home / Self Care | Payer: 59 | Source: Ambulatory Visit | Attending: Surgery | Admitting: Surgery

## 2017-09-16 ENCOUNTER — Inpatient Hospital Stay (HOSPITAL_COMMUNITY): Payer: 59 | Admitting: Certified Registered Nurse Anesthetist

## 2017-09-16 ENCOUNTER — Encounter (HOSPITAL_COMMUNITY): Payer: Self-pay | Admitting: Certified Registered Nurse Anesthetist

## 2017-09-16 ENCOUNTER — Other Ambulatory Visit: Payer: Self-pay

## 2017-09-16 ENCOUNTER — Encounter (HOSPITAL_COMMUNITY)
Admission: RE | Disposition: A | Discharge status: Home / Self Care | Payer: Self-pay | Source: Ambulatory Visit | Attending: Surgery

## 2017-09-16 ENCOUNTER — Ambulatory Visit: Payer: Self-pay | Admitting: *Deleted

## 2017-09-16 DIAGNOSIS — Z6841 Body Mass Index (BMI) 40.0 and over, adult: Secondary | ICD-10-CM

## 2017-09-16 DIAGNOSIS — Z881 Allergy status to other antibiotic agents status: Secondary | ICD-10-CM | POA: Diagnosis not present

## 2017-09-16 DIAGNOSIS — Z79899 Other long term (current) drug therapy: Secondary | ICD-10-CM | POA: Diagnosis not present

## 2017-09-16 DIAGNOSIS — Z79890 Hormone replacement therapy (postmenopausal): Secondary | ICD-10-CM | POA: Diagnosis not present

## 2017-09-16 DIAGNOSIS — K2950 Unspecified chronic gastritis without bleeding: Secondary | ICD-10-CM | POA: Diagnosis not present

## 2017-09-16 DIAGNOSIS — Z793 Long term (current) use of hormonal contraceptives: Secondary | ICD-10-CM | POA: Diagnosis not present

## 2017-09-16 DIAGNOSIS — E039 Hypothyroidism, unspecified: Secondary | ICD-10-CM | POA: Diagnosis not present

## 2017-09-16 DIAGNOSIS — I10 Essential (primary) hypertension: Secondary | ICD-10-CM | POA: Diagnosis present

## 2017-09-16 DIAGNOSIS — K219 Gastro-esophageal reflux disease without esophagitis: Secondary | ICD-10-CM | POA: Diagnosis present

## 2017-09-16 DIAGNOSIS — Z9884 Bariatric surgery status: Secondary | ICD-10-CM

## 2017-09-16 HISTORY — PX: LAPAROSCOPIC GASTRIC SLEEVE RESECTION: SHX5895

## 2017-09-16 LAB — CBC
HCT: 37.6 % (ref 36.0–46.0)
Hemoglobin: 12 g/dL (ref 12.0–15.0)
MCH: 27.7 pg (ref 26.0–34.0)
MCHC: 31.9 g/dL (ref 30.0–36.0)
MCV: 86.8 fL (ref 78.0–100.0)
PLATELETS: 304 10*3/uL (ref 150–400)
RBC: 4.33 MIL/uL (ref 3.87–5.11)
RDW: 14.4 % (ref 11.5–15.5)
WBC: 10 10*3/uL (ref 4.0–10.5)

## 2017-09-16 LAB — CREATININE, SERUM: CREATININE: 0.66 mg/dL (ref 0.44–1.00)

## 2017-09-16 LAB — PREGNANCY, URINE: PREG TEST UR: NEGATIVE

## 2017-09-16 SURGERY — GASTRECTOMY, SLEEVE, LAPAROSCOPIC
Anesthesia: General | Site: Abdomen

## 2017-09-16 MED ORDER — GLYCOPYRROLATE 0.2 MG/ML IV SOSY
PREFILLED_SYRINGE | INTRAVENOUS | Status: AC
  Filled 2017-09-16: qty 5

## 2017-09-16 MED ORDER — 0.9 % SODIUM CHLORIDE (POUR BTL) OPTIME
TOPICAL | Status: DC | PRN
  Administered 2017-09-16: 1000 mL

## 2017-09-16 MED ORDER — PANTOPRAZOLE SODIUM 40 MG IV SOLR
40.0000 mg | Freq: Every day | INTRAVENOUS | Status: DC
Start: 2017-09-16 — End: 2017-09-17
  Administered 2017-09-16: 40 mg via INTRAVENOUS
  Filled 2017-09-16: qty 40

## 2017-09-16 MED ORDER — ACETAMINOPHEN 500 MG PO TABS
1000.0000 mg | ORAL_TABLET | ORAL | Status: AC
  Administered 2017-09-16: 1000 mg via ORAL
  Filled 2017-09-16: qty 2

## 2017-09-16 MED ORDER — EPHEDRINE SULFATE-NACL 50-0.9 MG/10ML-% IV SOSY
PREFILLED_SYRINGE | INTRAVENOUS | Status: DC | PRN
  Administered 2017-09-16 (×2): 5 mg via INTRAVENOUS
  Administered 2017-09-16: 10 mg via INTRAVENOUS

## 2017-09-16 MED ORDER — SUCCINYLCHOLINE CHLORIDE 200 MG/10ML IV SOSY
PREFILLED_SYRINGE | INTRAVENOUS | Status: AC
  Filled 2017-09-16: qty 10

## 2017-09-16 MED ORDER — NEOSTIGMINE METHYLSULFATE 5 MG/5ML IV SOSY
PREFILLED_SYRINGE | INTRAVENOUS | Status: DC | PRN
  Administered 2017-09-16: 5 mg via INTRAVENOUS

## 2017-09-16 MED ORDER — ONDANSETRON HCL 4 MG/2ML IJ SOLN
INTRAMUSCULAR | Status: AC
  Filled 2017-09-16: qty 2

## 2017-09-16 MED ORDER — PREMIER PROTEIN SHAKE: 2.0000 [oz_av] | ORAL | Status: DC

## 2017-09-16 MED ORDER — LIP MEDEX EX OINT
TOPICAL_OINTMENT | CUTANEOUS | Status: AC
  Filled 2017-09-16: qty 7

## 2017-09-16 MED ORDER — HEPARIN SODIUM (PORCINE) 5000 UNIT/ML IJ SOLN
5000.0000 [IU] | Freq: Three times a day (TID) | INTRAMUSCULAR | Status: DC
Start: 2017-09-16 — End: 2017-09-17
  Administered 2017-09-16 – 2017-09-17 (×3): 5000 [IU] via SUBCUTANEOUS
  Filled 2017-09-16 (×3): qty 1

## 2017-09-16 MED ORDER — CHLORHEXIDINE GLUCONATE CLOTH 2 % EX PADS: 6.0000 | MEDICATED_PAD | Freq: Once | CUTANEOUS | Status: DC

## 2017-09-16 MED ORDER — ROCURONIUM BROMIDE 50 MG/5ML IV SOSY
PREFILLED_SYRINGE | INTRAVENOUS | Status: AC
  Filled 2017-09-16: qty 5

## 2017-09-16 MED ORDER — PHENYLEPHRINE 40 MCG/ML (10ML) SYRINGE FOR IV PUSH (FOR BLOOD PRESSURE SUPPORT)
PREFILLED_SYRINGE | INTRAVENOUS | Status: AC
  Filled 2017-09-16: qty 10

## 2017-09-16 MED ORDER — SCOPOLAMINE 1 MG/3DAYS TD PT72
1.0000 | MEDICATED_PATCH | TRANSDERMAL | Status: DC
  Administered 2017-09-16: 1.5 mg via TRANSDERMAL
  Filled 2017-09-16: qty 1

## 2017-09-16 MED ORDER — PROPOFOL 10 MG/ML IV BOLUS
INTRAVENOUS | Status: AC
  Filled 2017-09-16: qty 40

## 2017-09-16 MED ORDER — PROPOFOL 10 MG/ML IV BOLUS
INTRAVENOUS | Status: DC | PRN
  Administered 2017-09-16: 250 mg via INTRAVENOUS

## 2017-09-16 MED ORDER — LIDOCAINE 2% (20 MG/ML) 5 ML SYRINGE
INTRAMUSCULAR | Status: DC | PRN
  Administered 2017-09-16: 100 mg via INTRAVENOUS

## 2017-09-16 MED ORDER — MEPERIDINE HCL 50 MG/ML IJ SOLN: 6.2500 mg | INTRAMUSCULAR | Status: DC | PRN

## 2017-09-16 MED ORDER — ONDANSETRON HCL 4 MG/2ML IJ SOLN
INTRAMUSCULAR | Status: DC | PRN
  Administered 2017-09-16: 4 mg via INTRAVENOUS

## 2017-09-16 MED ORDER — ACETAMINOPHEN 160 MG/5ML PO SOLN: 650.0000 mg | ORAL | Status: DC | PRN

## 2017-09-16 MED ORDER — ROCURONIUM BROMIDE 50 MG/5ML IV SOSY
PREFILLED_SYRINGE | INTRAVENOUS | Status: DC | PRN
  Administered 2017-09-16: 10 mg via INTRAVENOUS
  Administered 2017-09-16: 50 mg via INTRAVENOUS

## 2017-09-16 MED ORDER — LIDOCAINE HCL 2 % IJ SOLN
INTRAMUSCULAR | Status: AC
  Filled 2017-09-16: qty 20

## 2017-09-16 MED ORDER — BUPIVACAINE LIPOSOME 1.3 % IJ SUSP
INTRAMUSCULAR | Status: DC | PRN
  Administered 2017-09-16: 20 mL

## 2017-09-16 MED ORDER — LIDOCAINE 2% (20 MG/ML) 5 ML SYRINGE
INTRAMUSCULAR | Status: AC
  Filled 2017-09-16: qty 5

## 2017-09-16 MED ORDER — CARVEDILOL 6.25 MG PO TABS
6.2500 mg | ORAL_TABLET | Freq: Two times a day (BID) | ORAL | Status: DC
  Administered 2017-09-16 – 2017-09-17 (×2): 6.25 mg via ORAL
  Filled 2017-09-16 (×2): qty 1

## 2017-09-16 MED ORDER — DEXAMETHASONE SODIUM PHOSPHATE 4 MG/ML IJ SOLN
4.0000 mg | INTRAMUSCULAR | Status: AC
  Administered 2017-09-16: 4 mg via INTRAVENOUS

## 2017-09-16 MED ORDER — PROMETHAZINE HCL 25 MG/ML IJ SOLN: 6.2500 mg | INTRAMUSCULAR | Status: DC | PRN

## 2017-09-16 MED ORDER — KCL IN DEXTROSE-NACL 20-5-0.45 MEQ/L-%-% IV SOLN
INTRAVENOUS | Status: DC
  Administered 2017-09-16 (×2): via INTRAVENOUS
  Filled 2017-09-16 (×4): qty 1000

## 2017-09-16 MED ORDER — SUCCINYLCHOLINE CHLORIDE 200 MG/10ML IV SOSY
PREFILLED_SYRINGE | INTRAVENOUS | Status: DC | PRN
  Administered 2017-09-16: 140 mg via INTRAVENOUS

## 2017-09-16 MED ORDER — LIDOCAINE 2% (20 MG/ML) 5 ML SYRINGE
INTRAMUSCULAR | Status: DC | PRN
  Administered 2017-09-16: 1.5 mg/kg/h via INTRAVENOUS

## 2017-09-16 MED ORDER — KETAMINE HCL 10 MG/ML IJ SOLN
INTRAMUSCULAR | Status: DC | PRN
  Administered 2017-09-16: 30 mg via INTRAVENOUS

## 2017-09-16 MED ORDER — FENTANYL CITRATE (PF) 100 MCG/2ML IJ SOLN
INTRAMUSCULAR | Status: AC
Start: 2017-09-16 — End: 2017-09-16
  Administered 2017-09-16: 50 ug via INTRAVENOUS
  Filled 2017-09-16: qty 2

## 2017-09-16 MED ORDER — FENTANYL CITRATE (PF) 100 MCG/2ML IJ SOLN
INTRAMUSCULAR | Status: AC
  Filled 2017-09-16: qty 2

## 2017-09-16 MED ORDER — KETAMINE HCL 10 MG/ML IJ SOLN
INTRAMUSCULAR | Status: AC
  Filled 2017-09-16: qty 1

## 2017-09-16 MED ORDER — PHENYLEPHRINE 40 MCG/ML (10ML) SYRINGE FOR IV PUSH (FOR BLOOD PRESSURE SUPPORT)
PREFILLED_SYRINGE | INTRAVENOUS | Status: DC | PRN
  Administered 2017-09-16 (×2): 120 ug via INTRAVENOUS
  Administered 2017-09-16 (×2): 80 ug via INTRAVENOUS

## 2017-09-16 MED ORDER — LEVOFLOXACIN IN D5W 750 MG/150ML IV SOLN
750.0000 mg | INTRAVENOUS | Status: AC
  Administered 2017-09-16: 750 mg via INTRAVENOUS
  Filled 2017-09-16: qty 150

## 2017-09-16 MED ORDER — FENTANYL CITRATE (PF) 250 MCG/5ML IJ SOLN
INTRAMUSCULAR | Status: AC
  Filled 2017-09-16: qty 5

## 2017-09-16 MED ORDER — NEOSTIGMINE METHYLSULFATE 5 MG/5ML IV SOSY
PREFILLED_SYRINGE | INTRAVENOUS | Status: AC
  Filled 2017-09-16: qty 5

## 2017-09-16 MED ORDER — MIDAZOLAM HCL 2 MG/2ML IJ SOLN
INTRAMUSCULAR | Status: AC
  Filled 2017-09-16: qty 2

## 2017-09-16 MED ORDER — HEPARIN SODIUM (PORCINE) 5000 UNIT/ML IJ SOLN
5000.0000 [IU] | INTRAMUSCULAR | Status: AC
  Administered 2017-09-16: 5000 [IU] via SUBCUTANEOUS
  Filled 2017-09-16: qty 1

## 2017-09-16 MED ORDER — APREPITANT 40 MG PO CAPS
40.0000 mg | ORAL_CAPSULE | ORAL | Status: AC
  Administered 2017-09-16: 40 mg via ORAL
  Filled 2017-09-16: qty 1

## 2017-09-16 MED ORDER — MIDAZOLAM HCL 5 MG/5ML IJ SOLN
INTRAMUSCULAR | Status: DC | PRN
  Administered 2017-09-16: 2 mg via INTRAVENOUS

## 2017-09-16 MED ORDER — LACTATED RINGERS IV SOLN
INTRAVENOUS | Status: DC
Start: 2017-09-16 — End: 2017-09-16
  Administered 2017-09-16 (×2): via INTRAVENOUS

## 2017-09-16 MED ORDER — HYDRALAZINE HCL 20 MG/ML IJ SOLN: 10.0000 mg | INTRAMUSCULAR | Status: DC | PRN

## 2017-09-16 MED ORDER — ONDANSETRON HCL 4 MG/2ML IJ SOLN
4.0000 mg | INTRAMUSCULAR | Status: DC | PRN
  Administered 2017-09-17: 4 mg via INTRAVENOUS
  Filled 2017-09-16: qty 2

## 2017-09-16 MED ORDER — BUPIVACAINE LIPOSOME 1.3 % IJ SUSP
20.0000 mL | Freq: Once | INTRAMUSCULAR | Status: DC
  Filled 2017-09-16: qty 20

## 2017-09-16 MED ORDER — GABAPENTIN 300 MG PO CAPS
300.0000 mg | ORAL_CAPSULE | ORAL | Status: AC
  Administered 2017-09-16: 300 mg via ORAL
  Filled 2017-09-16: qty 1

## 2017-09-16 MED ORDER — CELECOXIB 200 MG PO CAPS
400.0000 mg | ORAL_CAPSULE | ORAL | Status: AC
  Administered 2017-09-16: 400 mg via ORAL
  Filled 2017-09-16: qty 2

## 2017-09-16 MED ORDER — EPHEDRINE 5 MG/ML INJ
INTRAVENOUS | Status: AC
  Filled 2017-09-16: qty 10

## 2017-09-16 MED ORDER — SODIUM CHLORIDE 0.9 % IJ SOLN
INTRAMUSCULAR | Status: AC
  Filled 2017-09-16: qty 10

## 2017-09-16 MED ORDER — FENTANYL CITRATE (PF) 100 MCG/2ML IJ SOLN
INTRAMUSCULAR | Status: DC | PRN
  Administered 2017-09-16 (×3): 50 ug via INTRAVENOUS
  Administered 2017-09-16: 100 ug via INTRAVENOUS

## 2017-09-16 MED ORDER — GLYCOPYRROLATE 0.2 MG/ML IV SOSY
PREFILLED_SYRINGE | INTRAVENOUS | Status: DC | PRN
  Administered 2017-09-16: 0.6 mg via INTRAVENOUS

## 2017-09-16 MED ORDER — OXYCODONE HCL 5 MG/5ML PO SOLN: 5.0000 mg | ORAL | Status: DC | PRN

## 2017-09-16 MED ORDER — FENTANYL CITRATE (PF) 100 MCG/2ML IJ SOLN
25.0000 ug | INTRAMUSCULAR | Status: DC | PRN
  Administered 2017-09-16 (×3): 50 ug via INTRAVENOUS

## 2017-09-16 MED ORDER — MORPHINE SULFATE (PF) 2 MG/ML IV SOLN
1.0000 mg | INTRAVENOUS | Status: DC | PRN
  Administered 2017-09-16: 2 mg via INTRAVENOUS
  Administered 2017-09-16: 1 mg via INTRAVENOUS
  Administered 2017-09-17: 2 mg via INTRAVENOUS
  Filled 2017-09-16 (×3): qty 1

## 2017-09-16 SURGICAL SUPPLY — 64 items
ADH SKN CLS APL DERMABOND .7 (GAUZE/BANDAGES/DRESSINGS)
APPLICATOR COTTON TIP 6IN STRL (MISCELLANEOUS) IMPLANT
APPLIER CLIP 5 13 M/L LIGAMAX5 (MISCELLANEOUS)
APPLIER CLIP ROT 10 11.4 M/L (STAPLE)
APPLIER CLIP ROT 13.4 12 LRG (CLIP)
APR CLP LRG 13.4X12 ROT 20 MLT (CLIP)
APR CLP MED LRG 11.4X10 (STAPLE)
APR CLP MED LRG 5 ANG JAW (MISCELLANEOUS)
BLADE SURG 15 STRL LF DISP TIS (BLADE) ×1 IMPLANT
BLADE SURG 15 STRL SS (BLADE) ×3
CABLE HIGH FREQUENCY MONO STRZ (ELECTRODE) ×3 IMPLANT
CLIP APPLIE 5 13 M/L LIGAMAX5 (MISCELLANEOUS) IMPLANT
CLIP APPLIE ROT 10 11.4 M/L (STAPLE) IMPLANT
CLIP APPLIE ROT 13.4 12 LRG (CLIP) IMPLANT
DERMABOND ADVANCED (GAUZE/BANDAGES/DRESSINGS)
DERMABOND ADVANCED .7 DNX12 (GAUZE/BANDAGES/DRESSINGS) IMPLANT
DEVICE PMI PUNCTURE CLOSURE (MISCELLANEOUS) ×3 IMPLANT
DEVICE SUT QUICK LOAD TK 5 (STAPLE) IMPLANT
DEVICE SUT TI-KNOT TK 5X26 (MISCELLANEOUS) IMPLANT
DEVICE SUTURE ENDOST 10MM (ENDOMECHANICALS) IMPLANT
DEVICE TI KNOT TK5 (MISCELLANEOUS)
DISSECTOR BLUNT TIP ENDO 5MM (MISCELLANEOUS) IMPLANT
ELECT REM PT RETURN 15FT ADLT (MISCELLANEOUS) ×3 IMPLANT
GAUZE SPONGE 4X4 12PLY STRL (GAUZE/BANDAGES/DRESSINGS) IMPLANT
GLOVE BIOGEL M 8.0 STRL (GLOVE) ×3 IMPLANT
GOWN STRL REUS W/TWL XL LVL3 (GOWN DISPOSABLE) ×12 IMPLANT
HANDLE STAPLE EGIA 4 XL (STAPLE) ×3 IMPLANT
HOVERMATT SINGLE USE (MISCELLANEOUS) ×3 IMPLANT
KIT BASIN OR (CUSTOM PROCEDURE TRAY) ×3 IMPLANT
MARKER SKIN DUAL TIP RULER LAB (MISCELLANEOUS) ×3 IMPLANT
NDL SPNL 22GX3.5 QUINCKE BK (NEEDLE) ×1 IMPLANT
NEEDLE SPNL 22GX3.5 QUINCKE BK (NEEDLE) ×3 IMPLANT
PACK UNIVERSAL I (CUSTOM PROCEDURE TRAY) ×3 IMPLANT
QUICK LOAD TK 5 (STAPLE)
RELOAD STAPLE 45 PURP MED/THCK (STAPLE) IMPLANT
RELOAD TRI 45 ART MED THCK BLK (STAPLE) ×3 IMPLANT
RELOAD TRI 45 ART MED THCK PUR (STAPLE) ×3 IMPLANT
RELOAD TRI 60 ART MED THCK BLK (STAPLE) ×3 IMPLANT
RELOAD TRI 60 ART MED THCK PUR (STAPLE) ×4 IMPLANT
SCISSORS LAP 5X45 EPIX DISP (ENDOMECHANICALS) IMPLANT
SET IRRIG TUBING LAPAROSCOPIC (IRRIGATION / IRRIGATOR) ×3 IMPLANT
SHEARS HARMONIC ACE PLUS 45CM (MISCELLANEOUS) ×3 IMPLANT
SLEEVE ADV FIXATION 5X100MM (TROCAR) ×6 IMPLANT
SLEEVE GASTRECTOMY 36FR VISIGI (MISCELLANEOUS) ×3 IMPLANT
SOLUTION ANTI FOG 6CC (MISCELLANEOUS) ×3 IMPLANT
SPONGE LAP 18X18 X RAY DECT (DISPOSABLE) ×3 IMPLANT
STAPLER VISISTAT 35W (STAPLE) ×3 IMPLANT
SUT MNCRL AB 4-0 PS2 18 (SUTURE) ×3 IMPLANT
SUT SURGIDAC NAB ES-9 0 48 120 (SUTURE) IMPLANT
SUT VIC AB 4-0 SH 18 (SUTURE) ×3 IMPLANT
SUT VICRYL 0 TIES 12 18 (SUTURE) ×3 IMPLANT
SYR 10ML ECCENTRIC (SYRINGE) ×3 IMPLANT
SYR 20CC LL (SYRINGE) ×3 IMPLANT
SYR 50ML LL SCALE MARK (SYRINGE) ×3 IMPLANT
TOWEL OR 17X26 10 PK STRL BLUE (TOWEL DISPOSABLE) ×6 IMPLANT
TOWEL OR NON WOVEN STRL DISP B (DISPOSABLE) ×3 IMPLANT
TROCAR ADV FIXATION 5X100MM (TROCAR) ×3 IMPLANT
TROCAR BLADELESS 15MM (ENDOMECHANICALS) ×3 IMPLANT
TROCAR BLADELESS OPT 5 100 (ENDOMECHANICALS) ×3 IMPLANT
TUBE CALIBRATION LAPBAND (TUBING) IMPLANT
TUBING CONNECTING 10 (TUBING) ×2 IMPLANT
TUBING CONNECTING 10' (TUBING) ×1
TUBING ENDO SMARTCAP (MISCELLANEOUS) ×3 IMPLANT
TUBING INSUF HEATED (TUBING) ×3 IMPLANT

## 2017-09-16 NOTE — Anesthesia Procedure Notes (Signed)
Procedure Name: Intubation Date/Time: 09/16/2017 10:37 AM Performed by: Epimenio SarinJarvela, Shalon Salado R, CRNA Pre-anesthesia Checklist: Patient identified, Emergency Drugs available, Suction available, Patient being monitored and Timeout performed Patient Re-evaluated:Patient Re-evaluated prior to induction Oxygen Delivery Method: Circle system utilized Preoxygenation: Pre-oxygenation with 100% oxygen Induction Type: IV induction Ventilation: Mask ventilation without difficulty and Oral airway inserted - appropriate to patient size Laryngoscope Size: 3 and Glidescope Grade View: Grade I Tube type: Parker flex tip Tube size: 7.0 mm Number of attempts: 1 Airway Equipment and Method: Stylet Placement Confirmation: ETT inserted through vocal cords under direct vision,  positive ETCO2 and breath sounds checked- equal and bilateral Secured at: 21 cm Tube secured with: Tape Dental Injury: Teeth and Oropharynx as per pre-operative assessment  Comments: Electively used glidescope.

## 2017-09-16 NOTE — Discharge Instructions (Signed)

## 2017-09-16 NOTE — Op Note (Signed)
Name:  Taylor [name] Singleton MRN: [id number] Date of Surgery: 09/16/2017  Preop Diagnosis:  Morbid Obesity  Postop Diagnosis:  Morbid Obesity (Weight - 361, BMI - 65.7), S/P Gastric Sleeve resection  Procedure:  Upper endoscopy  (Intraoperative)  Surgeon:  Ovidio Kinavid Naisha Wisdom, M.D.  Anesthesia:  GET  Indications for procedure: Taylor [name] Buccheri is [name] 31 y.o. female whose primary care physician is Philemon KingdomProchnau, Caroline, MD and has completed [name] gastric sleeve resection today for weight loss by Dr. Daphine DeutscherMartin.  I am doing an intraoperative upper endoscopy to evaluate the gastric pouch after the sleeve gastrectomy.  Operative Note: The patient is under general anesthesia.  Dr. Daphine DeutscherMartin is laparoscoping the patient while I do an upper endoscopy to evaluate the stomach pouch.  With the patient intubated, I passed the Pentax upper endoscope without difficulty down the esophagus.  The esophagus was unremarkable.  The esophago-gastric junction was at 38 cm.    The mucosa of the stomach looked viable and the staple line was intact without bleeding.  I advanced the scope to the pylorus, but did not go through it.  While I insufflated the stomach pouch with air, Dr. Daphine DeutscherMartin  flooded the upper abdomen with saline to put the gastric pouch under saline.  There was no bubbling or evidence of [name] leak.  There was no evidence of narrowing of the pouch and the gastric sleeve looked tubular.  The scope was then withdrawn.  The esophagus was unremarkable and the patient tolerated the endoscopy without difficulty.  Ovidio Kinavid Johnte Portnoy, MD, [facility] Pager: [phone] Office phone:  [phone]

## 2017-09-16 NOTE — Op Note (Signed)
Surgeon: Wenda LowMatt Zayden Maffei, MD, FACS  Asst:  Ovidio Kinavid Newman, MD, FACS  Anes:  General endotracheal  Procedure: Laparoscopic sleeve gastrectomy and upper endoscopy  Diagnosis: Morbid obesity  Complications: none  EBL:   minimal cc  Description of Procedure:  The patient was take to OR 1 and given general anesthesia.  The abdomen was prepped with PCMX and draped sterilely.  A timeout was performed.  Access to the abdomen was achieved with a 5 mm Optiview through a very deep abdomen.  Following insufflation, the state of the abdomen was found to be free of adhesions.  The ViSiGi 36Fr tube was inserted to deflate the stomach and was pulled back into the esophagus.    The pylorus was identified and we measured 5 cm back and marked the antrum.  At that point we began dissection to take down the greater curvature of the stomach using the Harmonic scalpel.  This dissection was taken all the way up to the left crus.  Posterior attachments of the stomach were also taken down.    The ViSiGi tube was then passed into the antrum and suction applied so that it was snug along the lessor curvature.  The "crow's foot" or incisura was identified.  The sleeve gastrectomy was begun using the [employer]ov[employer] platform stapler beginning with a 4.5 cm black load with TRS followed with a 6 cm black load and then 6 cm purple loads.  When the sleeve was complete the tube was taken off suction and insufflated briefly.  The tube was withdrawn.  Upper endoscopy was then performed by Dr. Ezzard StandingNewman that showed a symmetrical cylinder.     The specimen was extracted through the 15 trocar site.  Wounds were infiltrated with Exparel and closed with 4-0 Monocryl and Dermabond.    Matt B. Daphine DeutscherMartin, MD, [facility], [place] [phone]

## 2017-09-16 NOTE — Anesthesia Preprocedure Evaluation (Signed)
Anesthesia Evaluation  Patient identified by MRN, date of birth, ID band Patient awake    Reviewed: Allergy & Precautions, NPO status , Patient's Chart, lab work & pertinent test results  History of Anesthesia Complications Negative for: history of anesthetic complications  Airway Mallampati: II  TM Distance: >3 FB Neck ROM: Full    Dental no notable dental hx.    Pulmonary neg COPD, former smoker,    breath sounds clear to auscultation- rhonchi (-) wheezing      Cardiovascular Exercise Tolerance: Good hypertension, (-) CAD and (-) Past MI  Rhythm:Regular Rate:Normal - Systolic murmurs and - Diastolic murmurs    Neuro/Psych PSYCHIATRIC DISORDERS Anxiety Depression negative neurological ROS     GI/Hepatic Neg liver ROS, GERD  ,  Endo/Other  neg diabetesHypothyroidism Morbid obesity  Renal/GU negative Renal ROS     Musculoskeletal negative musculoskeletal ROS (+)   Abdominal (+) + obese,   Peds  Hematology negative hematology ROS (+)   Anesthesia Other Findings   Reproductive/Obstetrics                            Anesthesia Physical  Anesthesia Plan  ASA: IV  Anesthesia Plan: General   Post-op Pain Management:    Induction: Intravenous  PONV Risk Score and Plan: 4 or greater and Treatment may vary due to age or medical condition, Ondansetron, Dexamethasone and Midazolam  Airway Management Planned: Oral ETT  Additional Equipment:   Intra-op Plan:   Post-operative Plan: Extubation in OR  Informed Consent: I have reviewed the patients History and Physical, chart, labs and discussed the procedure including the risks, benefits and alternatives for the proposed anesthesia with the patient or authorized representative who has indicated his/her understanding and acceptance.   Dental advisory given  Plan Discussed with: CRNA  Anesthesia Plan Comments:         Anesthesia  Quick Evaluation

## 2017-09-16 NOTE — Progress Notes (Signed)
Discussed post op day goals with patient including ambulation, IS, diet progression, pain, and nausea control.  Dsg placed on umbilicus incision, bleeding at site.  Questions answered.

## 2017-09-16 NOTE — Interval H&P Note (Signed)
History and Physical Interval Note:  09/16/2017 9:36 AM  Taylor [name]  has presented today for surgery, with the diagnosis of Morbid Obesity  The various methods of treatment have been discussed with the patient and family. After consideration of risks, benefits and other options for treatment, the patient has consented to  Procedure(s): LAPAROSCOPIC GASTRIC SLEEVE RESECTION, UPPER ENDO (N/A) as a surgical intervention .  The patient's history has been reviewed, patient examined, no change in status, stable for surgery.  I have reviewed the patient's chart and labs.  Questions were answered to the patient's satisfaction.     Valarie MerinoMatthew B Tareva Leske

## 2017-09-16 NOTE — Transfer of Care (Signed)
Immediate Anesthesia Transfer of Care Note  Patient: Taylor RaymondRebecca A Norgard  Procedure(s) Performed: LAPAROSCOPIC GASTRIC SLEEVE RESECTION, UPPER ENDO (N/A Abdomen)  Patient Location: PACU  Anesthesia Type:General  Level of Consciousness: drowsy and patient cooperative  Airway & Oxygen Therapy: Patient Spontanous Breathing and Patient connected to face mask  Post-op Assessment: Report given to RN and Post -op Vital signs reviewed and stable  Post vital signs: Reviewed and stable  Last Vitals:  Vitals:   09/16/17 0754  BP: (!) 145/91  Pulse: 81  Resp: 16  Temp: 36.5 C  SpO2: 98%    Last Pain:  Vitals:   09/16/17 0754  TempSrc: Oral      Patients Stated Pain Goal: 3 (09/16/17 0842)  Complications: No apparent anesthesia complications

## 2017-09-17 ENCOUNTER — Encounter (HOSPITAL_COMMUNITY): Payer: Self-pay | Admitting: Surgery

## 2017-09-17 LAB — CBC WITH DIFFERENTIAL/PLATELET
Basophils Absolute: 0 10*3/uL (ref 0.0–0.1)
Basophils Relative: 0 %
Eosinophils Absolute: 0 10*3/uL (ref 0.0–0.7)
Eosinophils Relative: 0 %
HEMATOCRIT: 36.8 % (ref 36.0–46.0)
HEMOGLOBIN: 11.7 g/dL — AB (ref 12.0–15.0)
LYMPHS ABS: 1.6 10*3/uL (ref 0.7–4.0)
LYMPHS PCT: 18 %
MCH: 27.6 pg (ref 26.0–34.0)
MCHC: 31.8 g/dL (ref 30.0–36.0)
MCV: 86.8 fL (ref 78.0–100.0)
MONO ABS: 0.7 10*3/uL (ref 0.1–1.0)
MONOS PCT: 8 %
NEUTROS ABS: 6.5 10*3/uL (ref 1.7–7.7)
Neutrophils Relative %: 74 %
Platelets: 323 10*3/uL (ref 150–400)
RBC: 4.24 MIL/uL (ref 3.87–5.11)
RDW: 14.2 % (ref 11.5–15.5)
WBC: 8.8 10*3/uL (ref 4.0–10.5)

## 2017-09-17 NOTE — Discharge Summary (Signed)
Physician Discharge Summary  Patient ID: Taylor RaymondRebecca A Petrosyan MRN: [id number] DOB/AGE: 31/02/1986 31 y.o.  Admit date: 09/16/2017 Discharge date: 09/17/2017  Admission Diagnoses:  Morbid obesity BMI 58  Discharge Diagnoses:  same  Principal Problem:   S/P laparoscopic sleeve gastrectomy Dec 2018 Active Problems:   Morbid obesity [facility](HCC)   Surgery:  Laparoscopic sleeve gastrectomy  Discharged Condition: improved  Hospital Course:   Had surgery on Tuesday.  Started on liquids and ready for discharge on PD 1.  Consults: none  Significant Diagnostic Studies: none    Discharge Exam: Blood pressure (!) 149/82, pulse 69, temperature 98.2 F (36.8 C), temperature source Oral, resp. rate 16, height 5\' 6"  (1.676 m), weight (!) 162.4 kg (358 lb), SpO2 99 %, currently breastfeeding. Incisions OK.  Some bleeding noted from the umbilical site [facility]    Disposition: 01-Home or Self Care  Discharge Instructions    Ambulate hourly while awake   Complete by:  As directed    Call MD for:  difficulty breathing, headache or visual disturbances   Complete by:  As directed    Call MD for:  persistant dizziness or light-headedness   Complete by:  As directed    Call MD for:  persistant nausea and vomiting   Complete by:  As directed    Call MD for:  redness, tenderness, or signs of infection (pain, swelling, redness, odor or green/yellow discharge around incision site)   Complete by:  As directed    Call MD for:  severe uncontrolled pain   Complete by:  As directed    Call MD for:  temperature >101 F   Complete by:  As directed    Diet bariatric full liquid   Complete by:  As directed    Incentive spirometry   Complete by:  As directed    Perform hourly while awake     Allergies as of 09/17/2017      Reactions   Keflex [cephalexin] Anaphylaxis      Medication List    TAKE these medications   aspirin-acetaminophen-caffeine 250-250-65 MG tablet Commonly known as:   EXCEDRIN MIGRAINE Take 2 tablets by mouth daily as needed for headache. Notes to patient:  Avoid NSAIDs for 6-8 weeks after surgery   carvedilol 6.25 MG tablet Commonly known as:  COREG Take 1 tablet (6.25 mg total) by mouth 2 (two) times daily. Notes to patient:  Monitor Blood Pressure Daily and keep a log for primary care physician.  You may need to make changes to your medications with rapid weight loss.     clindamycin 300 MG capsule Commonly known as:  CLEOCIN Take 1 capsule (300 mg total) by mouth 3 (three) times daily.   doxycycline 100 MG capsule Commonly known as:  VIBRAMYCIN Take 1 capsule (100 mg total) by mouth 2 (two) times daily.   FLUoxetine 40 MG capsule Commonly known as:  PROZAC Take 40 mg by mouth daily.   hydrochlorothiazide 12.5 MG capsule Commonly known as:  MICROZIDE Take 12.5 mg by mouth daily. Notes to patient:  Monitor Blood Pressure Daily and keep a log for primary care physician.  Monitor for symptoms of dehydration.  You may need to make changes to your medications with rapid weight loss.     levonorgestrel 20 MCG/24HR IUD Commonly known as:  MIRENA 1 each by Intrauterine route once.   levothyroxine 125 MCG tablet Commonly known as:  SYNTHROID, LEVOTHROID Take 125 mcg by mouth daily before breakfast.  naproxen 500 MG tablet Commonly known as:  NAPROSYN Take 1 tablet (500 mg total) by mouth 2 (two) times daily with a meal. Notes to patient:  Avoid NSAIDs for 6-8 weeks after surgery   nitrofurantoin (macrocrystal-monohydrate) 100 MG capsule Commonly known as:  MACROBID Take 1 capsule (100 mg total) by mouth 2 (two) times daily.   ondansetron 4 MG disintegrating tablet Commonly known as:  ZOFRAN-ODT Take 4 mg by mouth every 8 (eight) hours as needed for nausea/vomiting.   pantoprazole 40 MG tablet Commonly known as:  PROTONIX Take 40 mg by mouth daily.   XANAX 0.25 MG tablet Generic drug:  ALPRAZolam Take 0.25 mg by mouth 2 (two) times  daily as needed for anxiety.      Follow-up Information    [facility] Go [place]/2017.   Specialty:  General Surgery Why:  at 1015 with Dr Wenda LowMatt Steve Gregg Contact information: [address] [place] [address] [phone]        [facility] Follow up.   Specialty:  General Surgery Contact information: [address] [place] [address] [phone]           Signed: Valarie MerinoMatthew B Merrianne Mccumbers 09/17/2017, 11:09 AM

## 2017-09-17 NOTE — Progress Notes (Signed)
Patient alert and oriented, Post op day 1.  Provided support and encouragement.  Encouraged pulmonary toilet, ambulation and small sips of liquids.  Completed 12 ounces of clear fluids along with 4 ounces of protein.  All questions answered.  Will continue to monitor.

## 2017-09-17 NOTE — Progress Notes (Signed)
Patient alert and oriented, pain is controlled. Patient is tolerating fluids, advanced to protein shake today, patient is tolerating well.  Reviewed Gastric sleeve discharge instructions with patient and patient is able to articulate understanding.  Provided information on BELT program, Support Group and [facility] outpatient pharmacy. All questions answered, will continue to monitor.  

## 2017-09-17 NOTE — Anesthesia Postprocedure Evaluation (Signed)
Anesthesia Post Note  Patient: Tommie RaymondRebecca A Stepanek  Procedure(s) Performed: LAPAROSCOPIC GASTRIC SLEEVE RESECTION, UPPER ENDO (N/A Abdomen)     Patient location during evaluation: PACU Anesthesia Type: General Level of consciousness: sedated and patient cooperative Pain management: pain level controlled Vital Signs Assessment: post-procedure vital signs reviewed and stable Respiratory status: spontaneous breathing Cardiovascular status: stable Anesthetic complications: no    Last Vitals:  Vitals:   09/17/17 0805 09/17/17 1046  BP: (!) 157/93 (!) 149/82  Pulse: 68 69  Resp:  16  Temp:  36.8 C  SpO2:  99%    Last Pain:  Vitals:   09/17/17 1046  TempSrc: Oral  PainSc:                  Lewie LoronJohn Aryanna Shaver

## 2017-09-17 NOTE — Plan of Care (Signed)

## 2017-09-17 NOTE — Progress Notes (Signed)
Discharge instructions reviewed with patient. All questions answered. Patient wheeled down to vehicle with belongings by nurse tech. 

## 2017-09-19 ENCOUNTER — Encounter: Payer: Self-pay | Admitting: *Deleted

## 2017-09-19 ENCOUNTER — Other Ambulatory Visit: Payer: Self-pay | Admitting: *Deleted

## 2017-09-19 ENCOUNTER — Telehealth (HOSPITAL_COMMUNITY): Payer: Self-pay

## 2017-09-19 NOTE — Patient Outreach (Signed)
[facility] Care Management  09/19/2017  Taylor [name] 03/08/1986 [id number]   Subjective: Telephone call to patient's home number, spoke with patient, and HIPAA verified.  [facility]ed [facility] Transition of care follow up, patient voiced understanding, and is in agreement to follow up.   Patient states she is doing better, had some nausea from the anesthesia on 09/18/17, which was to be expected per MD, has a follow up appointment with primary MD on 09/24/17, and with surgeon on 10/01/17. Patient states she is able to manage self care and has assistance as needed.  Patient voices understanding of medical diagnosis, surgery, and treatment plan.  States she is accessing the following [facility] benefits: outpatient pharmacy, hospital indemnity (has started the claims process by requesting itemized bill), and will call [employer] today to start family medical leave act (FMLA) process.  Patient states she does not have any education material, transition of care, care coordination, disease management, disease monitoring, transportation, community resource, or pharmacy needs at this time.  States she is very appreciative of the follow up and is in agreement to receive [facility] information.    Objective: Per KPN (Knowledge Performance Now, point of care tool) and chart review,  Patient hospitalized 09/16/17 -125/18 for morbid obesity.  Status post LAPAROSCOPIC GASTRIC SLEEVE RESECTION[facility].     Patient also has a history of hypertension and hypothyroidism.       Assessment: Received [facility] Preoperative / Transition of care referral on 09/12/17.     Preoperative call follow up, not completed due to unable to contact patient.   Transition of care follow up completed, no care management needs, and will proceed with case closure.     Plan: RNCM will send patient successful outreach letter, [facility] pamph[facility], and magnet. RNCM will send  case closure due to follow up completed / no care management needs request to Iverson AlaminLaura Greeson at [facility].     Sukanya Goldblatt H. Gardiner Barefootooper RN, BSN, CCM [facility] Care [facility] Telephonic CM Phone: [phone] Fax: [phone]

## 2017-09-19 NOTE — Telephone Encounter (Signed)
Follow up with bariatric surgical patient to discuss post discharge questions.  No answer at this time voicemail left for patient along with contact information to discuss the following questions.  1.  Are you having any pain not relieved by pain medication?pain medication at night and works  2.  How much fluid total fluid intake have you had in the last 24/48 hours?  32+ ounces  3.  How much protein intake have you had in the last 24/48 hours?30 grams of protein  4.  Have you had any trouble making urine?yes  5.  Have you had nausea that has not been relieved by nausea medication?hasn't needed pain medicaation  6.  Are you ambulating every hour?yes  7.  Are you passing gas or had a BM?+ gas no BM instructed to use Miralax  8.  Do you know how to contact [facility]? [facility]? NDES?yes  9.  Are you taking your vitamins and calcium without difficulty?yes  10. Tell me how your incision looks?  Any redness, open incision, or drainage?incision looks good

## 2017-09-24 DIAGNOSIS — I10 Essential (primary) hypertension: Secondary | ICD-10-CM | POA: Diagnosis not present

## 2017-09-24 DIAGNOSIS — F419 Anxiety disorder, unspecified: Secondary | ICD-10-CM | POA: Diagnosis not present

## 2017-09-24 DIAGNOSIS — E039 Hypothyroidism, unspecified: Secondary | ICD-10-CM | POA: Diagnosis not present

## 2017-09-30 ENCOUNTER — Encounter: Payer: 59 | Attending: Surgery | Admitting: Skilled Nursing Facility1

## 2017-09-30 DIAGNOSIS — Z87891 Personal history of nicotine dependence: Secondary | ICD-10-CM | POA: Insufficient documentation

## 2017-09-30 DIAGNOSIS — E079 Disorder of thyroid, unspecified: Secondary | ICD-10-CM | POA: Diagnosis not present

## 2017-09-30 DIAGNOSIS — F419 Anxiety disorder, unspecified: Secondary | ICD-10-CM | POA: Insufficient documentation

## 2017-09-30 DIAGNOSIS — Z79899 Other long term (current) drug therapy: Secondary | ICD-10-CM | POA: Insufficient documentation

## 2017-09-30 DIAGNOSIS — I10 Essential (primary) hypertension: Secondary | ICD-10-CM | POA: Insufficient documentation

## 2017-09-30 DIAGNOSIS — Z713 Dietary counseling and surveillance: Secondary | ICD-10-CM | POA: Diagnosis not present

## 2017-09-30 DIAGNOSIS — Z6841 Body Mass Index (BMI) 40.0 and over, adult: Secondary | ICD-10-CM | POA: Diagnosis not present

## 2017-10-01 ENCOUNTER — Encounter: Payer: Self-pay | Admitting: Skilled Nursing Facility1

## 2017-10-01 NOTE — Progress Notes (Signed)
Bariatric Class:  Appt start time: 1530 end time:  1630.  2 Week Post-Operative Nutrition Class  Patient was seen on 10/01/2017 for [facility]ative [facility]   Surgery date: 09/16/2017 Surgery type: Sleeve Start weight at [facility]: 356 Weight today: 335.2  TANITA  BODY COMP RESULTS  10/01/2017   BMI (kg/m^2) 54.1   Fat Mass (lbs) 192.8   Fat Free Mass (lbs) 142.4   Total Body Water (lbs) 108.2   The following the learning objectives were met by the patient during this course:  Identifies Phase 3A (Soft, High Proteins) Dietary Goals and will begin from 2 weeks post-operatively to 2 months post-operatively  Identifies appropriate sources of fluids and proteins   States protein recommendations and appropriate sources post-operatively  Identifies the need for appropriate texture modifications, mastication, and bite sizes when consuming solids  Identifies appropriate multivitamin and calcium sources post-operatively  Describes the need for physical activity post-operatively and will follow MD recommendations  States when to call healthcare provider regarding medication questions or post-operative complications  Handouts given during class include:  Phase 3A: Soft, High Protein Diet Handout  Follow-Up Plan: Patient will follow-up at [facility] in 6 weeks for 2 month post-op nutrition visit for diet advancement per MD.

## 2017-11-11 ENCOUNTER — Ambulatory Visit: Payer: 59 | Admitting: Skilled Nursing Facility1

## 2017-11-13 DIAGNOSIS — H5213 Myopia, bilateral: Secondary | ICD-10-CM | POA: Diagnosis not present

## 2017-11-14 ENCOUNTER — Encounter: Payer: 59 | Attending: Surgery | Admitting: Registered"

## 2017-11-14 ENCOUNTER — Encounter: Payer: Self-pay | Admitting: Registered"

## 2017-11-14 DIAGNOSIS — E079 Disorder of thyroid, unspecified: Secondary | ICD-10-CM | POA: Insufficient documentation

## 2017-11-14 DIAGNOSIS — Z6841 Body Mass Index (BMI) 40.0 and over, adult: Secondary | ICD-10-CM | POA: Diagnosis not present

## 2017-11-14 DIAGNOSIS — Z87891 Personal history of nicotine dependence: Secondary | ICD-10-CM | POA: Insufficient documentation

## 2017-11-14 DIAGNOSIS — Z79899 Other long term (current) drug therapy: Secondary | ICD-10-CM | POA: Diagnosis not present

## 2017-11-14 DIAGNOSIS — Z713 Dietary counseling and surveillance: Secondary | ICD-10-CM | POA: Diagnosis not present

## 2017-11-14 DIAGNOSIS — E669 Obesity, unspecified: Secondary | ICD-10-CM

## 2017-11-14 DIAGNOSIS — F419 Anxiety disorder, unspecified: Secondary | ICD-10-CM | POA: Diagnosis not present

## 2017-11-14 DIAGNOSIS — I10 Essential (primary) hypertension: Secondary | ICD-10-CM | POA: Diagnosis not present

## 2017-11-14 NOTE — Patient Instructions (Addendum)
Goals:  Follow Phase 3B: High Protein + Non-Starchy Vegetables  Eat 3-6 small meals/snacks, every 3-5 hrs  Increase lean protein foods to meet 60g goal  Increase fluid intake to 64oz +  Avoid drinking 15 minutes before, during and 30 minutes after eating  Aim for >30 min of physical activity daily  - Add in extra calcium for total of 3 calcium supplements a day.   - Try [employer] multivitamin.

## 2017-11-14 NOTE — Progress Notes (Signed)
Follow-up visit:  8 Weeks Post-Operative Sleeve gastrectomy Surgery  Medical Nutrition Therapy:  Appt start time: 8:50 end time:  9:31.  Primary concerns today: Post-operative Bariatric Surgery Nutrition Management.  Non scale victories: wearing smaller bra sizes, increased energy, not out of breath taking the stairs, able to take stairs more often  Surgery date: 09/16/2017 Surgery type: Sleeve Start weight at [facility]: 356 Weight today: 317.6 Weight change: 17.6 lbs loss from 335.2 (10/01/2017) Total weight lost: 38.4 lbs Weight loss goal: being able to keep up with 32 year old daughter, below a size 16, being healthier, not being out of breath after going up stairs, to be more active  TANITA  BODY COMP RESULTS  10/01/2017 11/14/2017   BMI (kg/m^2) 54.1 51.3   Fat Mass (lbs) 192.8 182.4   Fat Free Mass (lbs) 142.4 135.2   Total Body Water (lbs) 108.2 102.2   Pt states she has been released to to workout and is looking forward to going to the gym. Pt reports plans to have membership this month. Pt states she was taking Celebrate bariatric vitamins and could not tolerate. Pt states surgeon advised her to take gummy multivitamin. Pt states she takes multivitamin with midmorning snack. Pt is doing well, meeting her daily fluid and protein goals. Pt states she is taking 2 calcium chewables a day.   Preferred Learning Style:   No preference indicated   Learning Readiness:   Ready  Change in progress  24-hr recall: B (AM): cheese omelette (1-2 eggs, 1 cheese 12-18g) Snk (AM): cheese (6g)  L (PM): 2 oz chicken or hamburger (air-fried) or shrimp or [place] (14g) Snk (PM): 2 oz deli chicken and cheese roll-up (20g) D (PM): chicken or hamburger (air-fried) or shrimp or [place] (14g) Snk (PM): sugar-free popsicle  Fluid intake: water with flavor packs, crystal light lemonade, unsweetened flavored tea; 64-72 oz Estimated total protein intake: ~66-72 grams  Medications: See list; no longer  taking blood pressure medication Supplementation: Women's One a day 3x/day + 2 calcium   Using straws: yes Drinking while eating: no Having you been chewing well: yes Chewing/swallowing difficulties: no Changes in vision: no Changes to mood/headaches: no Hair loss/Cahnges to skin/Changes to nails: no, no, no Any difficulty focusing or concentrating: no Sweating: no Dizziness/Lightheaded: no Palpitations: no  Carbonated beverages: no N/V/D/C/GAS: no, no, no, no, no Abdominal Pain: no Dumping syndrome: no Last Lap-Band fill: N/A  Recent physical activity:  Walking 1 mi, 5 days/week  Progress Towards Goal(s):  In progress.  Handouts given during visit include:  Phase 3B: High Protein + NS vegetables   Nutritional Diagnosis:  [place]-3.3 Overweight/obesity related to past poor dietary habits and physical inactivity as evidenced by patient w/ recent sleeve gastrectomy surgery following dietary guidelines for continued weight loss.    Intervention:  Nutrition education and counseling. Pt was educated and counseled on recommended vitamin and mineral needs. Pt was also provided sample of [employer] chewable multivitamin: Lot # [id number] Exp 04/2019 Goals:  Follow Phase 3B: High Protein + Non-Starchy Vegetables  Eat 3-6 small meals/snacks, every 3-5 hrs  Increase lean protein foods to meet 60g goal  Increase fluid intake to 64oz +  Avoid drinking 15 minutes before, during and 30 minutes after eating  Aim for >30 min of physical activity daily  - Add in extra calcium for total of 3 calcium supplements a day.  - Try [employer] multivitamin.   Teaching Method Utilized:  Visual Auditory Hands on  Barriers to  learning/adherence to lifestyle change: none identified  Demonstrated degree of understanding via:  Teach Back   Monitoring/Evaluation:  Dietary intake, exercise, and body weight. Follow up in 3 months for 5 month post-op visit.

## 2017-12-15 DIAGNOSIS — J0140 Acute pansinusitis, unspecified: Secondary | ICD-10-CM | POA: Diagnosis not present

## 2017-12-15 DIAGNOSIS — R490 Dysphonia: Secondary | ICD-10-CM | POA: Diagnosis not present

## 2017-12-24 ENCOUNTER — Other Ambulatory Visit
Admission: RE | Admit: 2017-12-24 | Discharge: 2017-12-24 | Disposition: A | Discharge status: Home / Self Care | Payer: 59 | Source: Ambulatory Visit | Attending: Internal Medicine | Admitting: Internal Medicine

## 2017-12-24 DIAGNOSIS — E039 Hypothyroidism, unspecified: Secondary | ICD-10-CM | POA: Insufficient documentation

## 2017-12-24 DIAGNOSIS — K05323 Chronic periodontitis, generalized, severe: Secondary | ICD-10-CM | POA: Diagnosis not present

## 2017-12-24 DIAGNOSIS — M278 Other specified diseases of jaws: Secondary | ICD-10-CM | POA: Diagnosis not present

## 2017-12-24 DIAGNOSIS — J329 Chronic sinusitis, unspecified: Secondary | ICD-10-CM | POA: Diagnosis not present

## 2017-12-24 LAB — TSH: TSH: 3.891 u[IU]/mL (ref 0.350–4.500)

## 2017-12-26 DIAGNOSIS — F419 Anxiety disorder, unspecified: Secondary | ICD-10-CM | POA: Diagnosis not present

## 2017-12-26 DIAGNOSIS — E039 Hypothyroidism, unspecified: Secondary | ICD-10-CM | POA: Diagnosis not present

## 2018-01-14 DIAGNOSIS — Z8051 Family history of malignant neoplasm of kidney: Secondary | ICD-10-CM | POA: Diagnosis not present

## 2018-01-14 DIAGNOSIS — Z6841 Body Mass Index (BMI) 40.0 and over, adult: Secondary | ICD-10-CM | POA: Diagnosis not present

## 2018-01-14 DIAGNOSIS — Z01419 Encounter for gynecological examination (general) (routine) without abnormal findings: Secondary | ICD-10-CM | POA: Diagnosis not present

## 2018-01-14 DIAGNOSIS — Z809 Family history of malignant neoplasm, unspecified: Secondary | ICD-10-CM | POA: Diagnosis not present

## 2018-01-14 DIAGNOSIS — Z800 Family history of malignant neoplasm of digestive organs: Secondary | ICD-10-CM | POA: Diagnosis not present

## 2018-01-14 DIAGNOSIS — Z808 Family history of malignant neoplasm of other organs or systems: Secondary | ICD-10-CM | POA: Diagnosis not present

## 2018-01-14 DIAGNOSIS — Z803 Family history of malignant neoplasm of breast: Secondary | ICD-10-CM | POA: Diagnosis not present

## 2018-02-17 ENCOUNTER — Ambulatory Visit: Payer: 59 | Admitting: Registered"

## 2018-02-25 DIAGNOSIS — Z809 Family history of malignant neoplasm, unspecified: Secondary | ICD-10-CM | POA: Diagnosis not present

## 2018-03-16 ENCOUNTER — Emergency Department
Admission: EM | Admit: 2018-03-16 | Discharge: 2018-03-16 | Disposition: A | Discharge status: Home / Self Care | Payer: PRIVATE HEALTH INSURANCE | Attending: Emergency Medicine | Admitting: Emergency Medicine

## 2018-03-16 ENCOUNTER — Other Ambulatory Visit: Payer: Self-pay

## 2018-03-16 DIAGNOSIS — Y92238 Other place in hospital as the place of occurrence of the external cause: Secondary | ICD-10-CM | POA: Insufficient documentation

## 2018-03-16 DIAGNOSIS — H10212 Acute toxic conjunctivitis, left eye: Secondary | ICD-10-CM

## 2018-03-16 DIAGNOSIS — W228XXA Striking against or struck by other objects, initial encounter: Secondary | ICD-10-CM | POA: Insufficient documentation

## 2018-03-16 DIAGNOSIS — Z79899 Other long term (current) drug therapy: Secondary | ICD-10-CM | POA: Insufficient documentation

## 2018-03-16 DIAGNOSIS — T2692XA Corrosion of left eye and adnexa, part unspecified, initial encounter: Secondary | ICD-10-CM | POA: Insufficient documentation

## 2018-03-16 DIAGNOSIS — Z87891 Personal history of nicotine dependence: Secondary | ICD-10-CM | POA: Diagnosis not present

## 2018-03-16 DIAGNOSIS — Y990 Civilian activity done for income or pay: Secondary | ICD-10-CM | POA: Diagnosis not present

## 2018-03-16 DIAGNOSIS — S0592XA Unspecified injury of left eye and orbit, initial encounter: Secondary | ICD-10-CM | POA: Diagnosis not present

## 2018-03-16 DIAGNOSIS — E039 Hypothyroidism, unspecified: Secondary | ICD-10-CM | POA: Insufficient documentation

## 2018-03-16 DIAGNOSIS — I10 Essential (primary) hypertension: Secondary | ICD-10-CM | POA: Insufficient documentation

## 2018-03-16 DIAGNOSIS — Y9389 Activity, other specified: Secondary | ICD-10-CM | POA: Diagnosis not present

## 2018-03-16 DIAGNOSIS — T550X1A Toxic effect of soaps, accidental (unintentional), initial encounter: Secondary | ICD-10-CM | POA: Diagnosis not present

## 2018-03-16 DIAGNOSIS — T551X1A Toxic effect of detergents, accidental (unintentional), initial encounter: Secondary | ICD-10-CM | POA: Diagnosis not present

## 2018-03-16 MED ORDER — OXYCODONE-ACETAMINOPHEN 5-325 MG PO TABS
ORAL_TABLET | ORAL | Status: DC
Start: 2018-03-16 — End: 2018-03-16
  Filled 2018-03-16: qty 1

## 2018-03-16 MED ORDER — TETRACAINE HCL 0.5 % OP SOLN
OPHTHALMIC | Status: AC
  Administered 2018-03-16: 02:00:00
  Filled 2018-03-16: qty 4

## 2018-03-16 MED ORDER — FLUORESCEIN SODIUM 1 MG OP STRP
ORAL_STRIP | OPHTHALMIC | Status: AC
  Administered 2018-03-16: 02:00:00
  Filled 2018-03-16: qty 1

## 2018-03-16 MED ORDER — OXYCODONE-ACETAMINOPHEN 5-325 MG PO TABS
1.0000 | ORAL_TABLET | Freq: Once | ORAL | Status: AC
  Administered 2018-03-16: 1 via ORAL
  Filled 2018-03-16: qty 1

## 2018-03-16 MED ORDER — ERYTHROMYCIN 5 MG/GM OP OINT
TOPICAL_OINTMENT | Freq: Once | OPHTHALMIC | Status: AC
  Administered 2018-03-16: 1 via OPHTHALMIC
  Filled 2018-03-16: qty 1

## 2018-03-16 MED ORDER — ERYTHROMYCIN 5 MG/GM OP OINT: TOPICAL_OINTMENT | Freq: Three times a day (TID) | OPHTHALMIC | 0 refills | Status: AC

## 2018-03-16 NOTE — ED Notes (Signed)
[place] lens inserted after last came out

## 2018-03-16 NOTE — ED Notes (Signed)
[facility]ynum [facility]ellow[facility]Ann Pride[facility]. No tests were requested for workman's comp.

## 2018-03-16 NOTE — ED Triage Notes (Signed)
Reports had chemical splash in her left eye (Purple Top cleaner).  Left eye is red.

## 2018-03-16 NOTE — Discharge Instructions (Addendum)
Please follow up with Ophthalmology for further evaluation of your eye injury.  According to the material safety data sheet the common finding was eye irritation lasting up to 72 hours.  Please return with any worsening condition or any other concerns.

## 2018-03-16 NOTE — ED Provider Notes (Signed)
[facility] Emergency Department Provider Note   ____________________________________________   First MD Initiated Contact with Patient 03/16/18 [phone]     (approximate)  I have reviewed the triage vital signs and the nursing notes.   HISTORY  Chief Complaint Chemical Exposure    HPI Taylor [name] is a 32 y.o. female who comes into the hospital today after splashing some cleaning solution accidentally in her eye.  The patient works in the laboratory at the hospital.  She picked up some wipes in an effort to clean something in the top fell off.  The bottle fell onto the floor and splashed into the patient's eye.  She reports that she felt immediate burning and remove her contact lenses.  She flushed her eye for 25 minutes and then came in for evaluation.  The patient is having pain to her left eye with some blurred vision.  She states that she is unable to see without her contacts.  The patient rates her pain 8-10 out of 10 in intensity.  She is here for evaluation.   Past Medical History:  Diagnosis Date  . Anxiety   . Complication of anesthesia    Hard to put under anesthesia, combative when waking up, heart palpitations under anesthesia  . Depression   . Essential hypertension 08/08/2016  . GERD (gastroesophageal reflux disease)   . H/O back injury    broken back age 52  . Hypertension    with pregnancy  . Hypothyroidism     Patient Active Problem List   Diagnosis Date Noted  . S/P laparoscopic sleeve gastrectomy Dec 2018 09/16/2017  . Morbid obesity (HCC) 09/16/2017  . Essential hypertension 08/08/2016  . PVCs (premature ventricular contractions) 08/06/2016  . Cholecystitis 06/03/2016  . Abdominal pain 05/28/2016  . Gallstones   . Normal pregnancy 11/21/2014  . Echogenic intracardiac focus of fetus on prenatal ultrasound   . Obesity complicating pregnancy   . Pre-existing essential hypertension complicating pregnancy in third trimester     . [redacted] weeks gestation of pregnancy   . [redacted] weeks gestation of pregnancy   . Chronic hypertension complicating or reason for care during pregnancy   . Obesity in pregnancy, antepartum   . [redacted] weeks gestation of pregnancy     Past Surgical History:  Procedure Laterality Date  . CESAREAN SECTION N/A 11/22/2014   Procedure: CESAREAN SECTION;  Surgeon: Turner Daniels, MD;  Location: [facility] ORS;  Service: Obstetrics;  Laterality: N/A;  . CHOLECYSTECTOMY N/A 05/29/2016   Procedure: LAPAROSCOPIC CHOLECYSTECTOMY;  Surgeon: Tiney Rouge III, MD;  Location: [facility] ORS;  Service: General;  Laterality: N/A;  . LAPAROSCOPIC GASTRIC SLEEVE RESECTION N/A 09/16/2017   Procedure: LAPAROSCOPIC GASTRIC SLEEVE RESECTION, UPPER ENDO;  Surgeon: Luretha Murphy, MD;  Location: [facility] ORS;  Service: General;  Laterality: N/A;  . LAPAROSCOPIC OVARIAN CYSTECTOMY Right   . LAPAROSCOPY Left 08/06/2016   Procedure: LAPAROSCOPY DIAGNOSTIC with drainage of left ovarian cyst;  Surgeon: Marcelle Overlie, MD;  Location: [facility] ORS;  Service: Gynecology;  Laterality: Left;  . WISDOM TOOTH EXTRACTION      Prior to Admission medications   Medication Sig Start Date End Date Taking? Authorizing Provider  ALPRAZolam (XANAX) 0.25 MG tablet Take 0.25 mg by mouth 2 (two) times daily as needed for anxiety.    [provider]  aspirin-acetaminophen-caffeine (EXCEDRIN MIGRAINE) [phone] MG tablet Take 2 tablets by mouth daily as needed for headache.    [provider]  calcium carbonate (OS-CAL) 1250 (500  Ca) MG chewable tablet Chew 2 tablets by mouth daily.    [provider]  carvedilol (COREG) 6.25 MG tablet Take 1 tablet (6.25 mg total) by mouth 2 (two) times daily. Patient not taking: Reported on 07/01/2017 08/08/16   Chilton Siandolph, Tiffany, MD  clindamycin (CLEOCIN) 300 MG capsule Take 1 capsule (300 mg total) by mouth 3 (three) times daily. Patient not taking: Reported on 09/11/2017 07/30/17   Jannifer RodneyHawks, Christy A, FNP  doxycycline  (VIBRAMYCIN) 100 MG capsule Take 1 capsule (100 mg total) by mouth 2 (two) times daily. Patient not taking: Reported on 07/01/2017 03/02/17   Chinita Pesterriplett, Cari B, FNP  erythromycin [facility](ROMYCIN) ophthalmic ointment Place into both eyes 3 (three) times daily for 10 days. Place a 1/2 inch ribbon of ointment into the lower eyelid for 5 days 03/16/18 03/26/18  Rebecka ApleyWebster, Allison P, MD  FLUoxetine (PROZAC) 40 MG capsule Take 40 mg by mouth daily.    [provider]  hydrochlorothiazide (MICROZIDE) 12.5 MG capsule Take 12.5 mg by mouth daily.    [provider]  levonorgestrel (MIRENA) 20 MCG/24HR IUD 1 each by Intrauterine route once.    [provider]  levothyroxine (SYNTHROID, LEVOTHROID) 125 MCG tablet Take 125 mcg by mouth daily before breakfast.    [provider]  Multiple Vitamins-Minerals (BARIATRIC FUSION) CHEW Chew 1 tablet by mouth 3 (three) times daily.    [provider]  naproxen (NAPROSYN) 500 MG tablet Take 1 tablet (500 mg total) by mouth 2 (two) times daily with a meal. Patient not taking: Reported on 07/01/2017 03/02/17   Chinita Pesterriplett, Cari B, FNP  nitrofurantoin, macrocrystal-monohydrate, (MACROBID) 100 MG capsule Take 1 capsule (100 mg total) by mouth 2 (two) times daily. Patient not taking: Reported on 07/01/2017 05/02/17   Beau FannyWithrow, John C, FNP  ondansetron (ZOFRAN-ODT) 4 MG disintegrating tablet Take 4 mg by mouth every 8 (eight) hours as needed for nausea/vomiting. 09/10/17   [provider]  OxyCODONE HCl 10 MG/0.5ML CONC Take 5-10 mLs by mouth every 4 (four) hours as needed. Take  5 to 10 ml every 4 hours as needed.    [provider]  pantoprazole (PROTONIX) 40 MG tablet Take 40 mg by mouth daily. 09/10/17   [provider]    Allergies Keflex [cephalexin]  Family History  Problem Relation Age of Onset  . Hypertension Mother   . Hypertension Father   . CAD Father   . Heart disease Maternal Grandmother   . Hypertension  Maternal Grandmother   . Stroke Maternal Grandmother   . Cancer Maternal Grandmother        breast colon  . Atrial fibrillation Maternal Grandmother   . Valvular heart disease Maternal Grandmother   . Hypertension Maternal Grandfather   . Diabetes Maternal Grandfather   . Hypertension Paternal Grandmother   . Cancer Paternal Grandmother        kidney  . Hypertension Paternal Grandfather     Social History Social History   Tobacco Use  . Smoking status: Former Smoker    Packs/day: 1.00    Years: 10.00    Pack years: 10.00    Types: Cigarettes    Last attempt to quit: 07/29/2014    Years since quitting: 3.6  . Smokeless tobacco: Never Used  Substance Use Topics  . Alcohol use: No  . Drug use: No    Review of Systems  Constitutional: No fever/chills Eyes: Left eye pain and redness ENT: No sore throat. Cardiovascular: Denies chest pain. Respiratory:  Denies shortness of breath. Gastrointestinal: No abdominal pain.   Genitourinary: Negative for dysuria. Musculoskeletal: Negative for back pain. Skin: Negative for rash. Neurological: Negative for headaches   ____________________________________________   PHYSICAL EXAM:  VITAL SIGNS: ED Triage Vitals  Enc Vitals Group     BP 03/16/18 0027 (!) 131/93     Pulse Rate 03/16/18 0027 70     Resp 03/16/18 0027 18     Temp 03/16/18 0027 98.2 F (36.8 C)     Temp Source 03/16/18 0027 Oral     SpO2 03/16/18 0027 97 %     Weight 03/16/18 0029 270 lb (122.5 kg)     Height 03/16/18 0029 5\' 6"  (1.676 m)     Head Circumference --      Peak Flow --      Pain Score 03/16/18 0028 10     Pain Loc --      Pain Edu? --      Excl. in GC? --     Constitutional: Alert and oriented. Well appearing and in no acute distress. Eyes: Sclera and conjunctiva injected, fluorescein exam performed with no uptake indicating corneal abrasion or ulceration.. Head: Atraumatic. Nose: No congestion/rhinnorhea. Mouth/Throat: Mucous membranes  are moist.  Oropharynx non-erythematous. Cardiovascular: Normal rate, regular rhythm. Grossly normal heart sounds.  Good peripheral circulation. Respiratory: Normal respiratory effort.  No retractions. Lungs CTAB. Gastrointestinal: Soft and nontender. No distention.  Musculoskeletal: No lower extremity tenderness nor edema.   Neurologic:  Normal speech and language.  Skin:  Skin is warm, dry and intact.  Psychiatric: Mood and affect are normal.   ____________________________________________   LABS (all labs ordered are listed, but only abnormal results are displayed)  Labs Reviewed - No data to display ____________________________________________  EKG  none ____________________________________________  RADIOLOGY  ED MD interpretation:  none  Official radiology report(s): No results found.  ____________________________________________   PROCEDURES  Procedure(s) performed: None  Procedures  Critical Care performed: No  ____________________________________________   INITIAL IMPRESSION / ASSESSMENT AND PLAN / ED COURSE  As part of my medical decision making, I reviewed the following data within the electronic MEDICAL RECORD NUMBER Notes from prior ED visits and [place] Controlled Substance Database   This is a 32 year old female who comes into the hospital today after having a chemical splashed in her eye.  The patient went sit for 25 minutes but came down as she was still having pain.  I did place some tetracaine in the patient's eye and then I did evaluate it with the [place] lamp.  There was no abrasion or ulceration noted.  We then did use the [facility] lens and irrigated the patient's eye with a liter of normal saline.  The patient did receive a dose of Percocet for pain and I will place some erythromycin ointment in the patient's eye.  I did review the material safety data sheet which did recommend first-aid to be taken to hold eye open and rinse slowly and gently for 15 to 20  minutes and then to continue rinsing.  There was a study performed according to the sheet that showed moderate irritation at 72 hours but there is a risk for irreversible eye damage.  The patient does have irritation but no ulceration at this time.  She will be encouraged to follow-up with ophthalmology for reevaluation of her eye.  She will be discharged home.      ____________________________________________   FINAL CLINICAL IMPRESSION(S) / ED DIAGNOSES  Final diagnoses:  Chemical injury  of left eye, initial encounter  Chemical conjunctivitis of left eye     ED Discharge Orders        Ordered    erythromycin [facility]) ophthalmic ointment  3 times daily     03/16/18 0424       Note:  This document was prepared using Dragon voice recognition software and may include unintentional dictation errors.    Rebecka Apley, MD 03/16/18 [phone]

## 2018-03-18 DIAGNOSIS — Z1331 Encounter for screening for depression: Secondary | ICD-10-CM | POA: Diagnosis not present

## 2018-03-18 DIAGNOSIS — Z0000 Encounter for general adult medical examination without abnormal findings: Secondary | ICD-10-CM | POA: Diagnosis not present

## 2019-05-26 ENCOUNTER — Encounter (HOSPITAL_COMMUNITY): Payer: Self-pay

## 2020-03-26 IMAGING — CR DG KNEE COMPLETE 4+V*L*
4 series · 4 of 4 positions shown · non-contrast
Comparison: None.

CLINICAL DATA: 35-year-old female with left knee pain.

EXAM:
LEFT KNEE - COMPLETE 4+ VIEW

[knee ap]
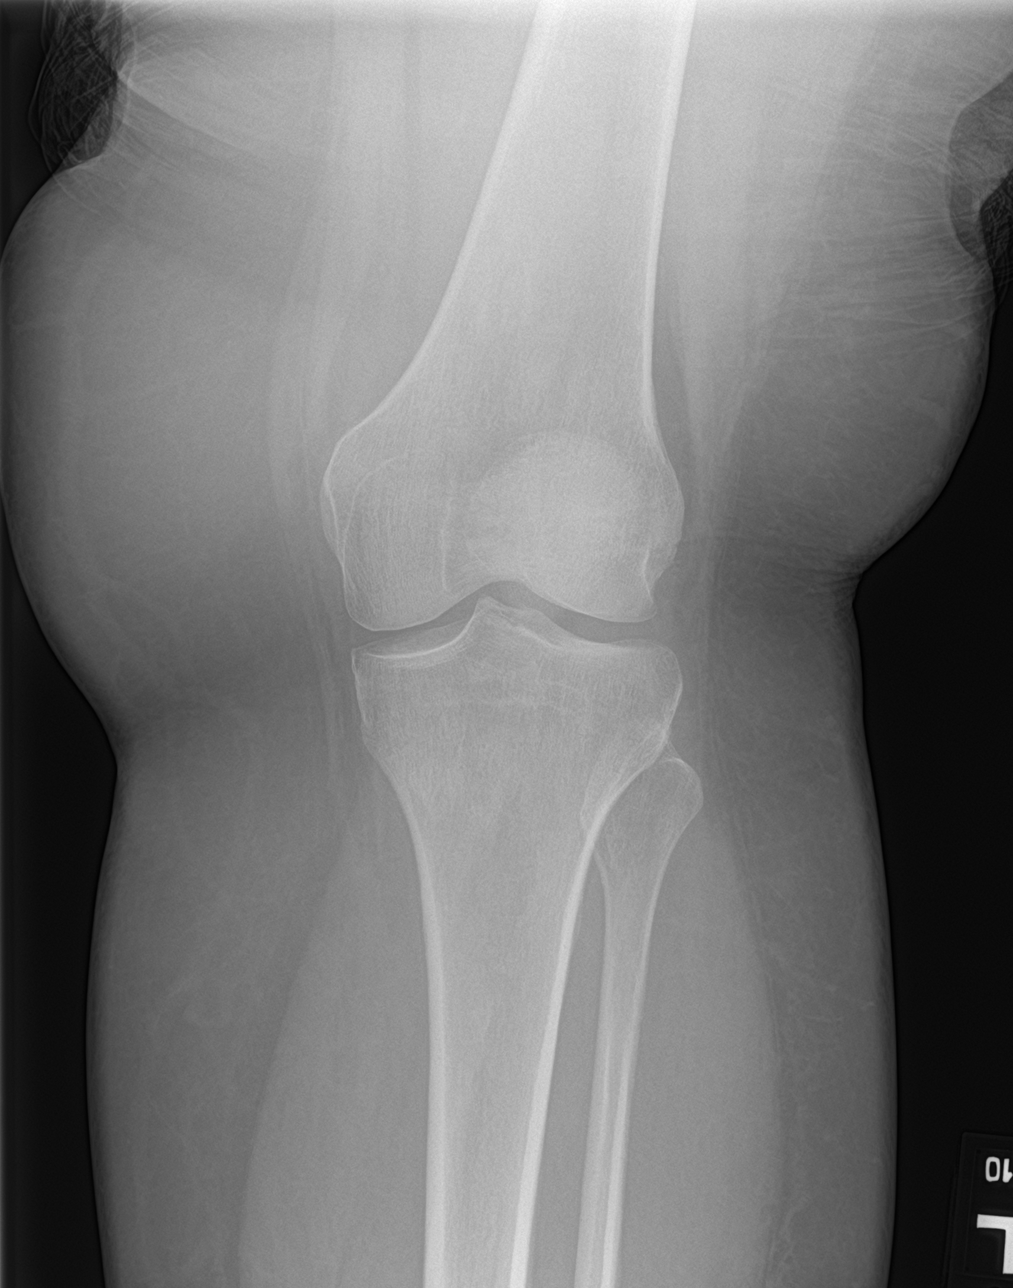

[knee obl (1 of 2)]
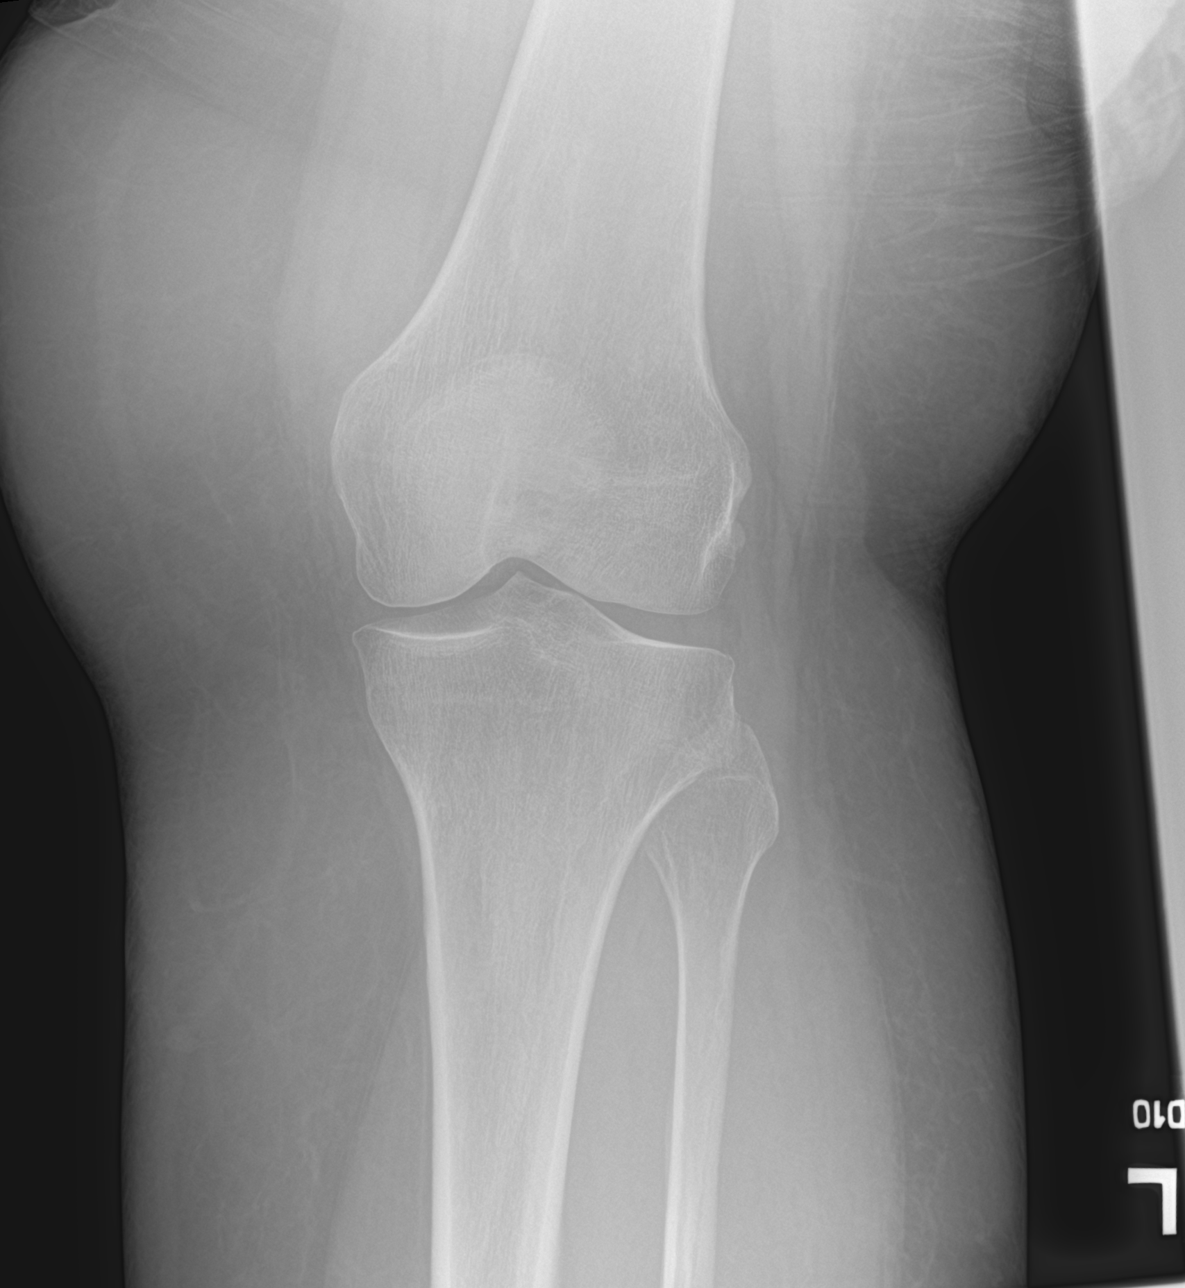

[knee obl (2 of 2)]
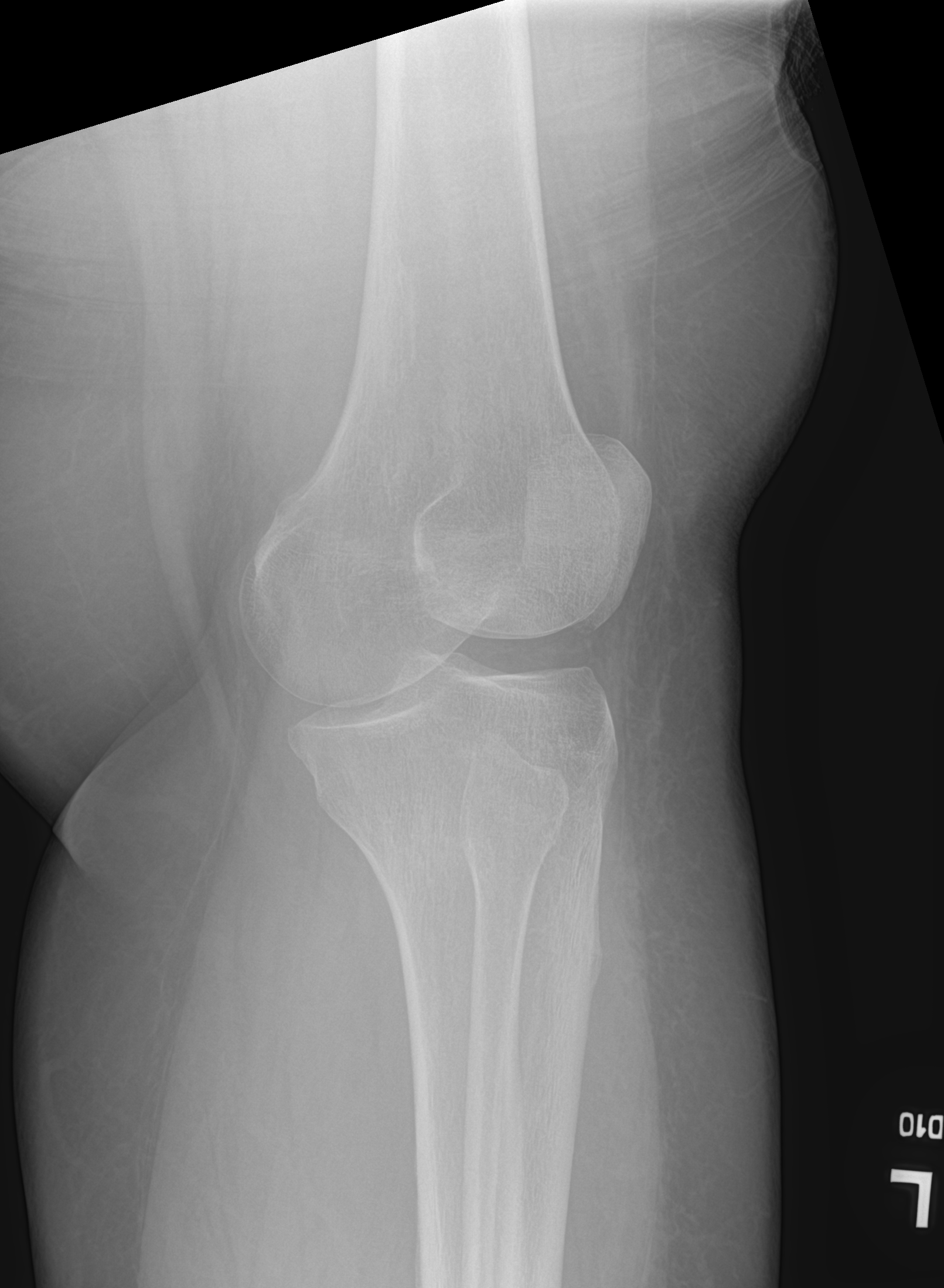

[knee lat]
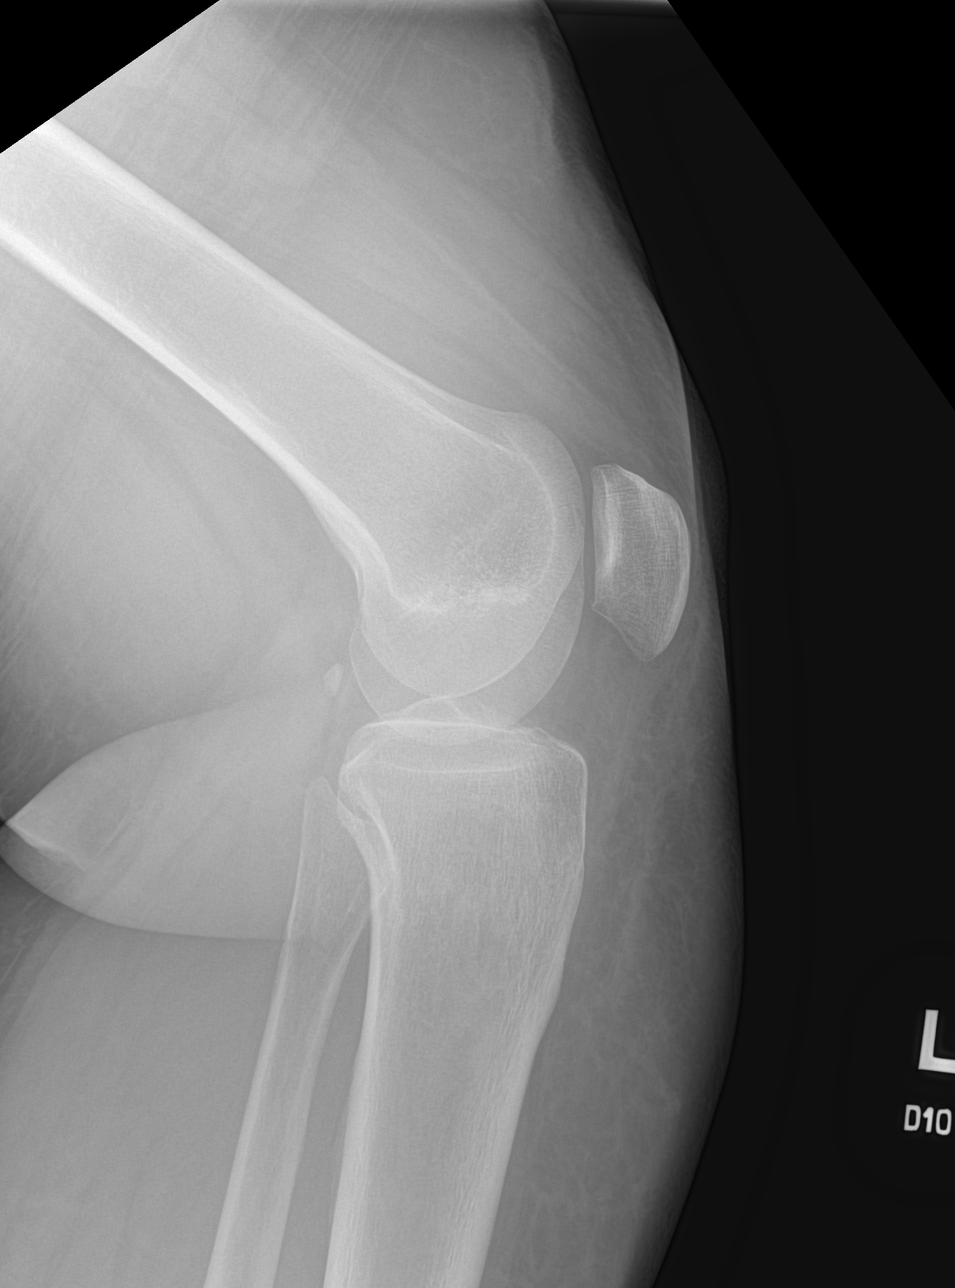

[4 of 4 positions shown; findings below may reference images not displayed]

FINDINGS: No evidence of fracture, dislocation, or joint effusion. No evidence
of arthropathy or other focal bone abnormality. Soft tissues are
unremarkable.
IMPRESSION: Negative.

## 2020-04-06 ENCOUNTER — Other Ambulatory Visit
Admission: RE | Admit: 2020-04-06 | Discharge: 2020-04-06 | Disposition: A | Discharge status: Home / Self Care | Payer: 59 | Source: Ambulatory Visit | Attending: Internal Medicine | Admitting: Internal Medicine

## 2020-04-06 DIAGNOSIS — E039 Hypothyroidism, unspecified: Secondary | ICD-10-CM | POA: Diagnosis present

## 2020-04-06 LAB — TSH: TSH: 0.075 u[IU]/mL — ABNORMAL LOW (ref 0.350–4.500)

## 2020-04-07 ENCOUNTER — Other Ambulatory Visit: Payer: Self-pay | Admitting: Internal Medicine

## 2020-04-08 LAB — T4: T4, Total: 9.2 ug/dL (ref 4.5–12.0)

## 2020-04-11 ENCOUNTER — Encounter (HOSPITAL_COMMUNITY): Payer: Self-pay

## 2020-05-04 ENCOUNTER — Other Ambulatory Visit: Payer: Self-pay | Admitting: Internal Medicine

## 2020-05-11 ENCOUNTER — Other Ambulatory Visit: Payer: Self-pay | Admitting: Internal Medicine

## 2020-11-16 ENCOUNTER — Other Ambulatory Visit: Payer: Self-pay | Admitting: Internal Medicine

## 2020-11-16 DIAGNOSIS — Z6841 Body Mass Index (BMI) 40.0 and over, adult: Secondary | ICD-10-CM | POA: Diagnosis not present

## 2020-11-16 DIAGNOSIS — K219 Gastro-esophageal reflux disease without esophagitis: Secondary | ICD-10-CM | POA: Diagnosis not present

## 2020-11-16 DIAGNOSIS — E039 Hypothyroidism, unspecified: Secondary | ICD-10-CM | POA: Diagnosis not present

## 2020-11-16 DIAGNOSIS — I10 Essential (primary) hypertension: Secondary | ICD-10-CM | POA: Diagnosis not present

## 2020-11-16 DIAGNOSIS — M25562 Pain in left knee: Secondary | ICD-10-CM | POA: Diagnosis not present

## 2020-11-16 DIAGNOSIS — Z79899 Other long term (current) drug therapy: Secondary | ICD-10-CM | POA: Diagnosis not present

## 2020-11-16 DIAGNOSIS — F419 Anxiety disorder, unspecified: Secondary | ICD-10-CM | POA: Diagnosis not present

## 2020-11-17 DIAGNOSIS — H1033 Unspecified acute conjunctivitis, bilateral: Secondary | ICD-10-CM | POA: Diagnosis not present

## 2020-11-22 ENCOUNTER — Ambulatory Visit
Admission: RE | Admit: 2020-11-22 | Discharge: 2020-11-22 | Disposition: A | Discharge status: Home / Self Care | Payer: 59 | Attending: Internal Medicine | Admitting: Internal Medicine

## 2020-11-22 ENCOUNTER — Other Ambulatory Visit: Payer: Self-pay | Admitting: Dentistry

## 2020-11-22 ENCOUNTER — Ambulatory Visit
Admission: RE | Admit: 2020-11-22 | Discharge: 2020-11-22 | Disposition: A | Discharge status: Home / Self Care | Payer: 59 | Source: Ambulatory Visit | Attending: Internal Medicine | Admitting: Internal Medicine

## 2020-11-22 ENCOUNTER — Other Ambulatory Visit: Payer: Self-pay | Admitting: Internal Medicine

## 2020-11-22 DIAGNOSIS — M25562 Pain in left knee: Secondary | ICD-10-CM

## 2020-12-05 DIAGNOSIS — Z30430 Encounter for insertion of intrauterine contraceptive device: Secondary | ICD-10-CM | POA: Diagnosis not present

## 2020-12-12 ENCOUNTER — Other Ambulatory Visit: Payer: Self-pay | Admitting: Internal Medicine

## 2021-01-15 DIAGNOSIS — H5213 Myopia, bilateral: Secondary | ICD-10-CM | POA: Diagnosis not present

## 2021-01-26 ENCOUNTER — Other Ambulatory Visit: Payer: Self-pay

## 2021-01-26 MED FILL — Cyanocobalamin Inj 1000 MCG/ML: INTRAMUSCULAR | 90 days supply | Qty: 3 | Fill #0 | Status: AC

## 2021-01-29 DIAGNOSIS — Z30431 Encounter for routine checking of intrauterine contraceptive device: Secondary | ICD-10-CM | POA: Diagnosis not present

## 2021-02-06 ENCOUNTER — Other Ambulatory Visit
Admission: RE | Admit: 2021-02-06 | Discharge: 2021-02-06 | Disposition: A | Discharge status: Home / Self Care | Payer: 59 | Source: Ambulatory Visit | Attending: Internal Medicine | Admitting: Internal Medicine

## 2021-02-06 DIAGNOSIS — E559 Vitamin D deficiency, unspecified: Secondary | ICD-10-CM | POA: Diagnosis not present

## 2021-02-06 LAB — VITAMIN D 25 HYDROXY (VIT D DEFICIENCY, FRACTURES): Vit D, 25-Hydroxy: 25.69 ng/mL — ABNORMAL LOW (ref 30–100)

## 2021-02-09 ENCOUNTER — Other Ambulatory Visit: Payer: Self-pay

## 2021-02-09 MED ORDER — ERGOCALCIFEROL 1.25 MG (50000 UT) PO CAPS
ORAL_CAPSULE | ORAL | 1 refills | Status: AC
  Filled 2021-02-09: qty 12, 84d supply, fill #0

## 2021-02-20 ENCOUNTER — Other Ambulatory Visit: Payer: Self-pay

## 2021-03-21 ENCOUNTER — Telehealth: Payer: 59 | Admitting: Physician Assistant

## 2021-03-21 DIAGNOSIS — J029 Acute pharyngitis, unspecified: Secondary | ICD-10-CM

## 2021-03-21 NOTE — Progress Notes (Signed)
E-Visit for Sore Throat  We are sorry that you are not feeling well.  Here is how we plan to help!  Your symptoms indicate a likely viral infection (Pharyngitis).   Pharyngitis is inflammation in the back of the throat which can cause a sore throat, scratchiness and sometimes difficulty swallowing.   Pharyngitis is typically caused by a respiratory virus and will just run its course.  Please keep in mind that your symptoms could last up to 10 days.  For throat pain, we recommend over the counter oral pain relief medications such as acetaminophen or aspirin, or anti-inflammatory medications such as ibuprofen or naproxen sodium.  Topical treatments such as oral throat lozenges or sprays may be used as needed.  Avoid close contact with loved ones, especially the very young and elderly.  Remember to wash your hands thoroughly throughout the day as this is the number one way to prevent the spread of infection and wipe down door knobs and counters with disinfectant.  After careful review of your answers, I would not recommend and antibiotic for your condition.  Antibiotics should not be used to treat conditions that we suspect are caused by viruses like the virus that causes the common cold or flu. However, some people can have Strep with atypical symptoms. You may need formal testing in clinic or office to confirm if your symptoms continue or worsen.  Providers prescribe antibiotics to treat infections caused by bacteria. Antibiotics are very powerful in treating bacterial infections when they are used properly.  To maintain their effectiveness, they should be used only when necessary.  Overuse of antibiotics has resulted in the development of super bugs that are resistant to treatment!    Home Care:  Only take medications as instructed by your medical team.  Do not drink alcohol while taking these medications.  A steam or ultrasonic humidifier can help congestion.  You can place a towel over your head  and breathe in the steam from hot water coming from a faucet.  Avoid close contacts especially the very young and the elderly.  Cover your mouth when you cough or sneeze.  Always remember to wash your hands.  Get Help Right Away If:  You develop worsening fever or throat pain.  You develop a severe head ache or visual changes.  Your symptoms persist after you have completed your treatment plan.  Make sure you  Understand these instructions.  Will watch your condition.  Will get help right away if you are not doing well or get worse.  Your e-visit answers were reviewed by a board certified advanced clinical practitioner to complete your personal care plan.  Depending on the condition, your plan could have included both over the counter or prescription medications.  If there is a problem please reply  once you have received a response from your provider.  Your safety is important to [place].  If you have drug allergies check your prescription carefully.    You can use MyChart to ask questions about todays visit, request a non-urgent call back, or ask for a work or school excuse for 24 hours related to this e-Visit. If it has been greater than 24 hours you will need to follow up with your provider, or enter a new e-Visit to address those concerns.  You will get an e-mail in the next two days asking about your experience.  I hope that your e-visit has been valuable and will speed your recovery. Thank you for using e-visits.  I provided 5 minutes of non face-to-face time during this encounter for chart review and documentation.

## 2021-04-13 ENCOUNTER — Encounter (HOSPITAL_COMMUNITY): Payer: Self-pay | Admitting: *Deleted

## 2021-05-27 ENCOUNTER — Other Ambulatory Visit: Payer: Self-pay

## 2021-05-27 ENCOUNTER — Emergency Department: Payer: 59

## 2021-05-27 ENCOUNTER — Emergency Department
Admission: EM | Admit: 2021-05-27 | Discharge: 2021-05-27 | Disposition: A | Discharge status: Home / Self Care | Payer: 59 | Source: Home / Self Care | Attending: Emergency Medicine | Admitting: Emergency Medicine

## 2021-05-27 ENCOUNTER — Encounter: Payer: Self-pay | Admitting: Emergency Medicine

## 2021-05-27 DIAGNOSIS — S82001A Unspecified fracture of right patella, initial encounter for closed fracture: Principal | ICD-10-CM | POA: Diagnosis present

## 2021-05-27 DIAGNOSIS — X501XXA Overexertion from prolonged static or awkward postures, initial encounter: Secondary | ICD-10-CM

## 2021-05-27 DIAGNOSIS — E039 Hypothyroidism, unspecified: Secondary | ICD-10-CM | POA: Diagnosis not present

## 2021-05-27 DIAGNOSIS — Z9049 Acquired absence of other specified parts of digestive tract: Secondary | ICD-10-CM

## 2021-05-27 DIAGNOSIS — Z7982 Long term (current) use of aspirin: Secondary | ICD-10-CM | POA: Diagnosis not present

## 2021-05-27 DIAGNOSIS — Z79890 Hormone replacement therapy (postmenopausal): Secondary | ICD-10-CM | POA: Diagnosis not present

## 2021-05-27 DIAGNOSIS — Z975 Presence of (intrauterine) contraceptive device: Secondary | ICD-10-CM

## 2021-05-27 DIAGNOSIS — Z803 Family history of malignant neoplasm of breast: Secondary | ICD-10-CM

## 2021-05-27 DIAGNOSIS — S83512A Sprain of anterior cruciate ligament of left knee, initial encounter: Secondary | ICD-10-CM | POA: Diagnosis not present

## 2021-05-27 DIAGNOSIS — I10 Essential (primary) hypertension: Secondary | ICD-10-CM | POA: Diagnosis not present

## 2021-05-27 DIAGNOSIS — F419 Anxiety disorder, unspecified: Secondary | ICD-10-CM | POA: Diagnosis present

## 2021-05-27 DIAGNOSIS — Z881 Allergy status to other antibiotic agents status: Secondary | ICD-10-CM | POA: Diagnosis not present

## 2021-05-27 DIAGNOSIS — Z20822 Contact with and (suspected) exposure to covid-19: Secondary | ICD-10-CM | POA: Diagnosis present

## 2021-05-27 DIAGNOSIS — Z79899 Other long term (current) drug therapy: Secondary | ICD-10-CM | POA: Insufficient documentation

## 2021-05-27 DIAGNOSIS — Z8249 Family history of ischemic heart disease and other diseases of the circulatory system: Secondary | ICD-10-CM

## 2021-05-27 DIAGNOSIS — Z043 Encounter for examination and observation following other accident: Secondary | ICD-10-CM | POA: Diagnosis not present

## 2021-05-27 DIAGNOSIS — Z9884 Bariatric surgery status: Secondary | ICD-10-CM

## 2021-05-27 DIAGNOSIS — W1830XA Fall on same level, unspecified, initial encounter: Secondary | ICD-10-CM | POA: Diagnosis present

## 2021-05-27 DIAGNOSIS — Z823 Family history of stroke: Secondary | ICD-10-CM | POA: Diagnosis not present

## 2021-05-27 DIAGNOSIS — M25462 Effusion, left knee: Secondary | ICD-10-CM | POA: Diagnosis not present

## 2021-05-27 DIAGNOSIS — S82102A Unspecified fracture of upper end of left tibia, initial encounter for closed fracture: Secondary | ICD-10-CM | POA: Diagnosis not present

## 2021-05-27 DIAGNOSIS — S82131D Displaced fracture of medial condyle of right tibia, subsequent encounter for closed fracture with routine healing: Secondary | ICD-10-CM | POA: Diagnosis not present

## 2021-05-27 DIAGNOSIS — Z87891 Personal history of nicotine dependence: Secondary | ICD-10-CM | POA: Insufficient documentation

## 2021-05-27 DIAGNOSIS — Z6841 Body Mass Index (BMI) 40.0 and over, adult: Secondary | ICD-10-CM | POA: Diagnosis not present

## 2021-05-27 DIAGNOSIS — K219 Gastro-esophageal reflux disease without esophagitis: Secondary | ICD-10-CM | POA: Diagnosis present

## 2021-05-27 DIAGNOSIS — S82044D Nondisplaced comminuted fracture of right patella, subsequent encounter for closed fracture with routine healing: Secondary | ICD-10-CM | POA: Diagnosis not present

## 2021-05-27 DIAGNOSIS — S82101A Unspecified fracture of upper end of right tibia, initial encounter for closed fracture: Secondary | ICD-10-CM | POA: Diagnosis not present

## 2021-05-27 DIAGNOSIS — Z833 Family history of diabetes mellitus: Secondary | ICD-10-CM

## 2021-05-27 DIAGNOSIS — S82141A Displaced bicondylar fracture of right tibia, initial encounter for closed fracture: Secondary | ICD-10-CM | POA: Diagnosis present

## 2021-05-27 DIAGNOSIS — S82142A Displaced bicondylar fracture of left tibia, initial encounter for closed fracture: Secondary | ICD-10-CM | POA: Diagnosis not present

## 2021-05-27 DIAGNOSIS — S83512D Sprain of anterior cruciate ligament of left knee, subsequent encounter: Secondary | ICD-10-CM | POA: Diagnosis not present

## 2021-05-27 DIAGNOSIS — M25562 Pain in left knee: Secondary | ICD-10-CM | POA: Insufficient documentation

## 2021-05-27 DIAGNOSIS — S82009A Unspecified fracture of unspecified patella, initial encounter for closed fracture: Secondary | ICD-10-CM | POA: Diagnosis not present

## 2021-05-27 DIAGNOSIS — F32A Depression, unspecified: Secondary | ICD-10-CM | POA: Diagnosis present

## 2021-05-27 DIAGNOSIS — S82131A Displaced fracture of medial condyle of right tibia, initial encounter for closed fracture: Secondary | ICD-10-CM | POA: Diagnosis not present

## 2021-05-27 IMAGING — CR DG KNEE COMPLETE 4+V*L*
1 series · 4 of 4 positions shown · non-contrast
Comparison: None.

CLINICAL DATA: Left knee pain following fall, initial encounter

EXAM:
LEFT KNEE - COMPLETE 4+ VIEW

[Series 1: dg knee complete 4 views left · 0.14mm/px · 4 of 4 slices shown]
[im 1/4]
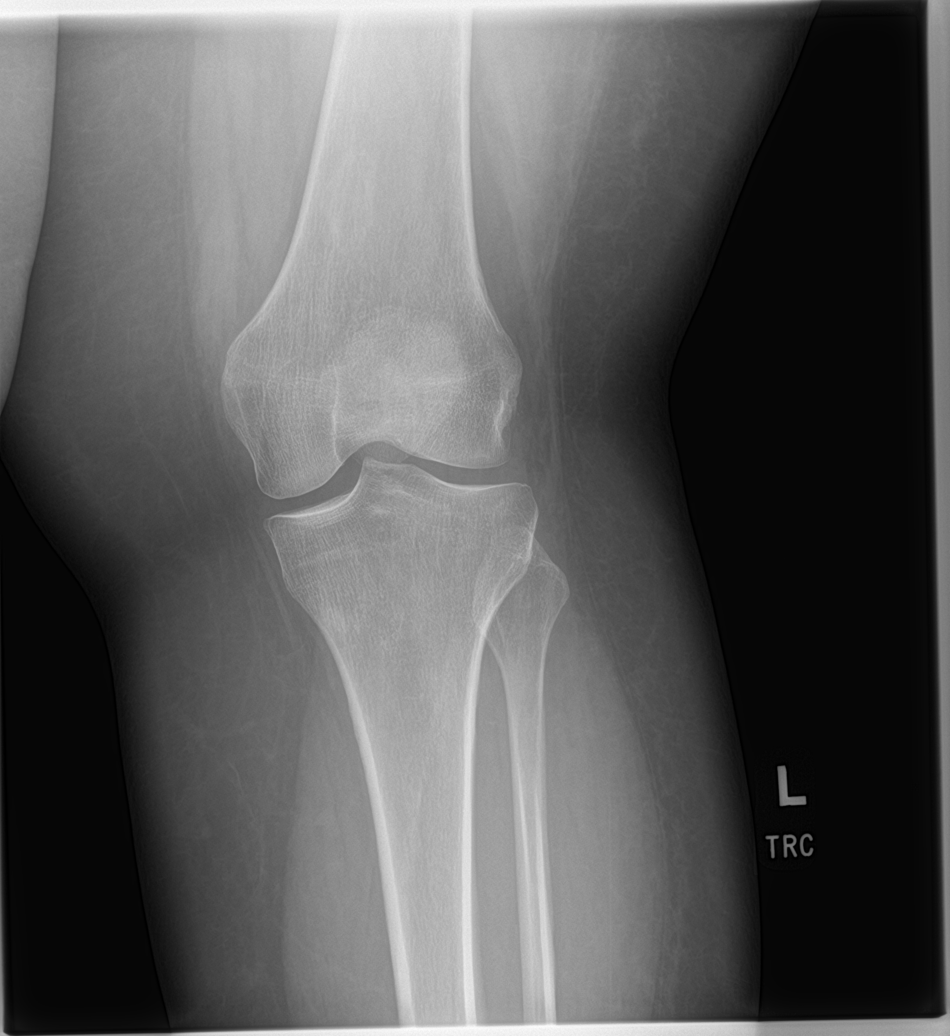
[im 2/4]
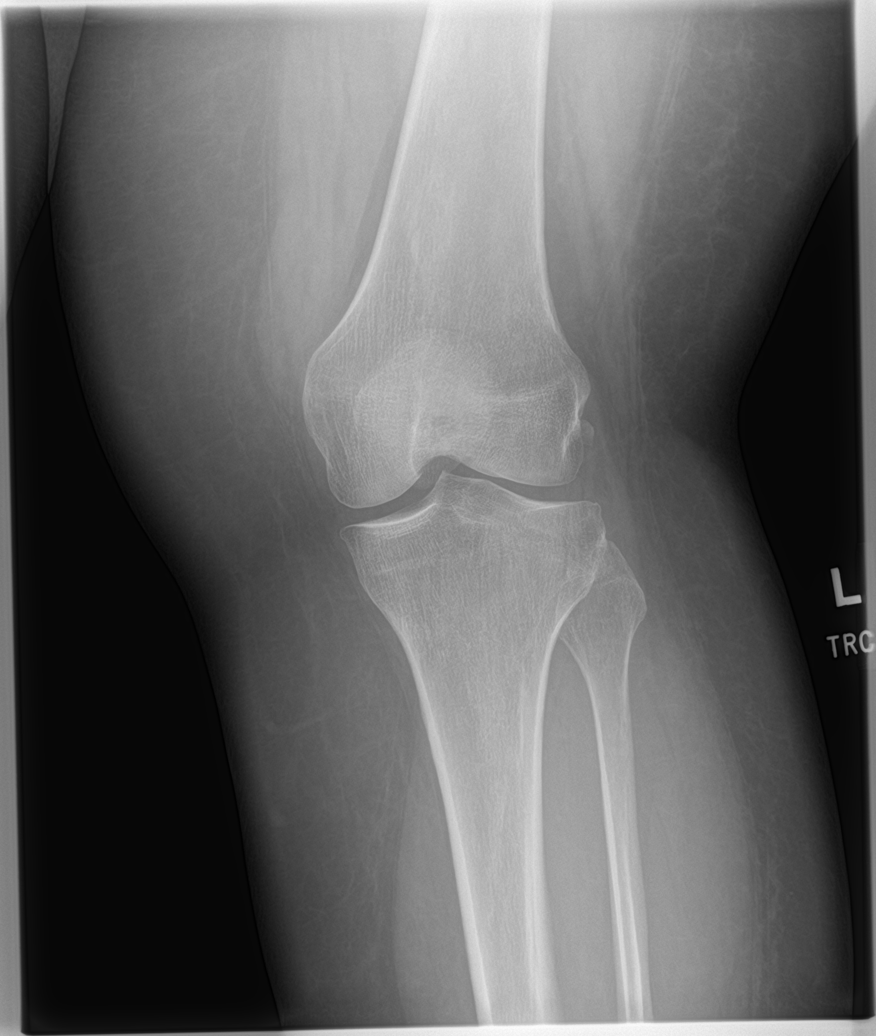
[im 3/4]
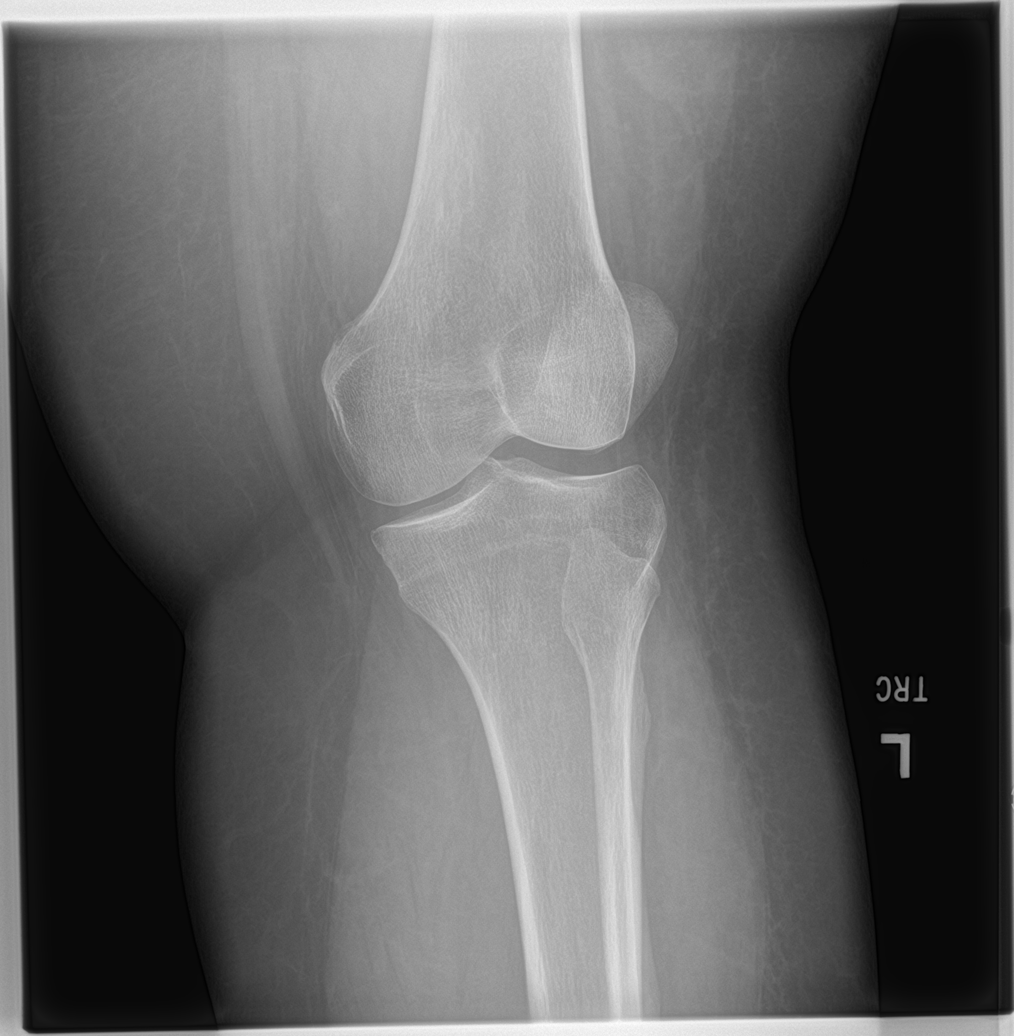
[im 4/4]
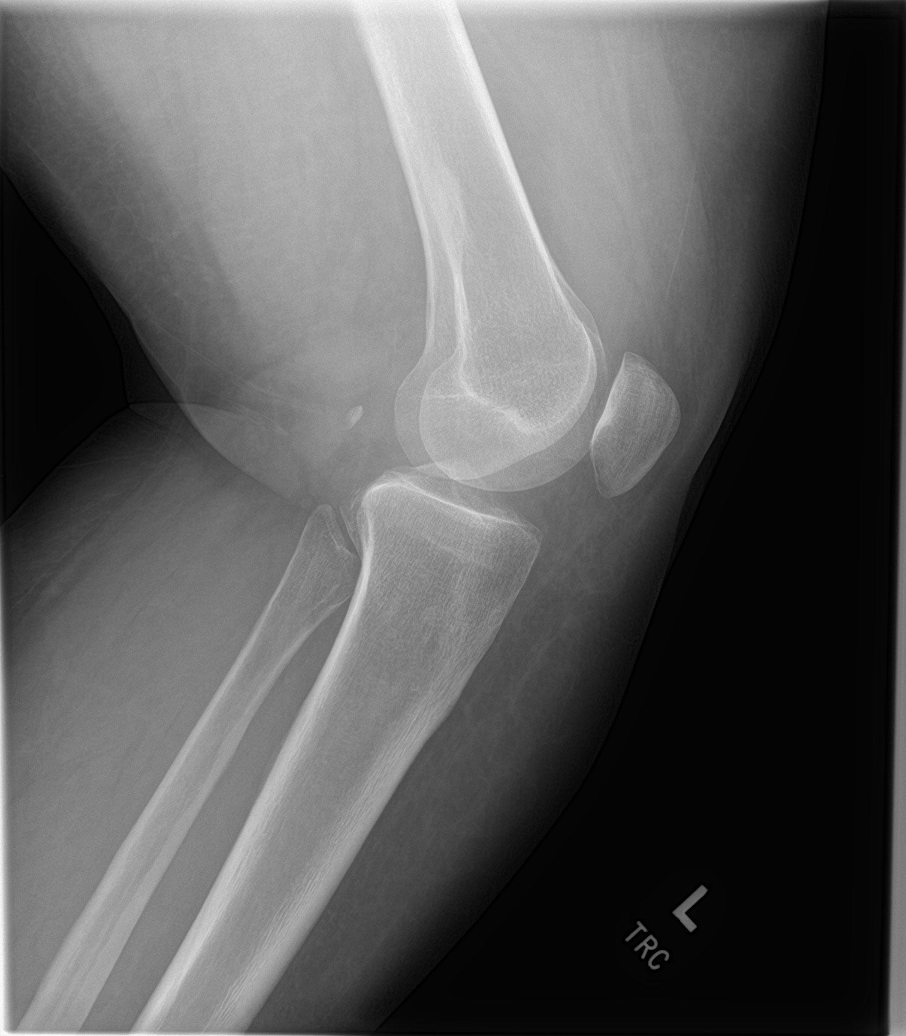

[4 of 4 positions shown; findings below may reference images not displayed]

FINDINGS: No evidence of fracture, dislocation, or joint effusion. No evidence
of arthropathy or other focal bone abnormality. Soft tissues are
unremarkable.
IMPRESSION: No acute abnormality noted.

## 2021-05-27 MED ORDER — OXYCODONE-ACETAMINOPHEN 5-325 MG PO TABS
1.0000 | ORAL_TABLET | Freq: Once | ORAL | Status: AC
  Administered 2021-05-27: 1 via ORAL
  Filled 2021-05-27: qty 1

## 2021-05-27 MED ORDER — ONDANSETRON 4 MG PO TBDP: 4.0000 mg | ORAL_TABLET | Freq: Three times a day (TID) | ORAL | 0 refills | Status: DC | PRN

## 2021-05-27 MED ORDER — KETOROLAC TROMETHAMINE 30 MG/ML IJ SOLN
30.0000 mg | Freq: Once | INTRAMUSCULAR | Status: AC
  Administered 2021-05-27: 30 mg via INTRAMUSCULAR
  Filled 2021-05-27: qty 1

## 2021-05-27 MED ORDER — ONDANSETRON 4 MG PO TBDP
4.0000 mg | ORAL_TABLET | Freq: Once | ORAL | Status: AC
  Administered 2021-05-27: 4 mg via ORAL
  Filled 2021-05-27: qty 1

## 2021-05-27 MED ORDER — HYDROCODONE-ACETAMINOPHEN 5-325 MG PO TABS: 1.0000 | ORAL_TABLET | Freq: Four times a day (QID) | ORAL | 0 refills | Status: DC | PRN

## 2021-05-27 NOTE — ED Triage Notes (Signed)
PT c/o L knee pain after falling and twisting knee. Pt has hx of L knee injury. Painful to bear weight.

## 2021-05-27 NOTE — Discharge Instructions (Addendum)
You can take Norco for pain. Please make follow-up appointment with Dr. Nadara Eaton can combine Norco and 800 mg of ibuprofen for pain and inflammation.

## 2021-05-27 NOTE — ED Provider Notes (Signed)
[facility]-EMERGENCY DEPARTMENT  ____________________________________________  Time seen: Approximately 11:17 PM  I have reviewed the triage vital signs and the nursing notes.   HISTORY  Chief Complaint Knee Pain   Historian Patient    HPI Taylor [name] is a 35 y.o. female presents to the emergency department after patient had a twisting type left knee injury and fell to the floor.  Patient reports that she has had difficulty ambulating since injury occurred.  No lacerations.  Patient does have some bruising along the lateral aspect of her left knee.  No numbness or tingling of the left lower extremity.   Past Medical History:  Diagnosis Date   Anxiety    Complication of anesthesia    Hard to put under anesthesia, combative when waking up, heart palpitations under anesthesia   Depression    Essential hypertension 08/08/2016   GERD (gastroesophageal reflux disease)    H/O back injury    broken back age 35   Hypertension    with pregnancy   Hypothyroidism      Immunizations up to date:  Yes.     Past Medical History:  Diagnosis Date   Anxiety    Complication of anesthesia    Hard to put under anesthesia, combative when waking up, heart palpitations under anesthesia   Depression    Essential hypertension 08/08/2016   GERD (gastroesophageal reflux disease)    H/O back injury    broken back age 35   Hypertension    with pregnancy   Hypothyroidism     Patient Active Problem List   Diagnosis Date Noted   S/P laparoscopic sleeve gastrectomy Dec 2018 09/16/2017   Morbid obesity (HCC) 09/16/2017   Essential hypertension 08/08/2016   PVCs (premature ventricular contractions) 08/06/2016   Cholecystitis 06/03/2016   Abdominal pain 05/28/2016   Gallstones    Normal pregnancy 11/21/2014   Echogenic intracardiac focus of fetus on prenatal ultrasound    Obesity complicating pregnancy    Pre-existing essential hypertension complicating pregnancy in third trimester     [redacted] weeks gestation of pregnancy    [redacted] weeks gestation of pregnancy    Chronic hypertension complicating or reason for care during pregnancy    Obesity in pregnancy, antepartum    [redacted] weeks gestation of pregnancy     Past Surgical History:  Procedure Laterality Date   CESAREAN SECTION N/A 11/22/2014   Procedure: CESAREAN SECTION;  Surgeon: Turner Danielsavid C Lowe, MD;  Location: [facility];  Service: Obstetrics;  Laterality: N/A;   CHOLECYSTECTOMY N/A 05/29/2016   Procedure: LAPAROSCOPIC CHOLECYSTECTOMY;  Surgeon: Tiney Rougealph Ely III, MD;  Location: [facility];  Service: General;  Laterality: N/A;   LAPAROSCOPIC GASTRIC SLEEVE RESECTION N/A 09/16/2017   Procedure: LAPAROSCOPIC GASTRIC SLEEVE RESECTION, UPPER ENDO;  Surgeon: Luretha MurphyMartin, Matthew, MD;  Location: [facility];  Service: General;  Laterality: N/A;   LAPAROSCOPIC OVARIAN CYSTECTOMY Right    LAPAROSCOPY Left 08/06/2016   Procedure: LAPAROSCOPY DIAGNOSTIC with drainage of left ovarian cyst;  Surgeon: Marcelle OverlieMichelle Grewal, MD;  Location: [facility];  Service: Gynecology;  Laterality: Left;   WISDOM TOOTH EXTRACTION      Prior to Admission medications   Medication Sig Start Date End Date Taking? Authorizing Provider  HYDROcodone-acetaminophen (NORCO) 5-325 MG tablet Take 1 tablet by mouth every 6 (six) hours as needed for up to 3 days. 05/27/21 05/30/21 Yes Pia MauWoods, Dinah Lupa M, PA-C  ondansetron (ZOFRAN ODT) 4 MG disintegrating tablet Take 1 tablet (4 mg total) by mouth every 8 (eight) hours as needed  for up to 3 days. 05/27/21 05/30/21 Yes Pia Mau M, PA-C  ALPRAZolam Prudy Feeler) 0.25 MG tablet Take 0.25 mg by mouth 2 (two) times daily as needed for anxiety.    [provider]  aspirin-acetaminophen-caffeine (EXCEDRIN MIGRAINE) [phone] MG tablet Take 2 tablets by mouth daily as needed for headache.    [provider]  calcium carbonate (OS-CAL) 1250 (500 Ca) MG chewable tablet Chew 2 tablets by mouth daily.    [provider]  ergocalciferol (VITAMIN  D2) 1.25 MG (50000 UT) capsule take 1 capsule by mouth Once a week 02/08/21     famotidine (PEPCID) 20 MG tablet TAKE 1 TABLET BY MOUTH FOR BREAKTHROUGH HEARTBURN TWICE A DAILY AS NEEDED 11/16/20 11/16/21  Philemon Kingdom, MD  FLUoxetine (PROZAC) 40 MG capsule Take 40 mg by mouth daily.    [provider]  FLUoxetine (PROZAC) 40 MG capsule TAKE 1 CAPSULE BY MOUTH ONCE DAILY IN THE MORNING 04/07/20 04/07/21  Philemon Kingdom, MD  hydrochlorothiazide (HYDRODIURIL) 25 MG tablet TAKE 1 TABLET BY MOUTH ONCE A DAY 04/07/20 04/07/21  Philemon Kingdom, MD  hydrochlorothiazide (MICROZIDE) 12.5 MG capsule Take 12.5 mg by mouth daily.    [provider]  levonorgestrel (MIRENA) 20 MCG/24HR IUD 1 each by Intrauterine route once.    [provider]  levothyroxine (SYNTHROID) 112 MCG tablet TAKE 1 TABLET BY MOUTH ONCE A DAY 05/11/20 05/11/21  Philemon Kingdom, MD  levothyroxine (SYNTHROID, LEVOTHROID) 125 MCG tablet Take 125 mcg by mouth daily before breakfast.    [provider]  Liraglutide -Weight Management 18 MG/3ML SOPN INJECT 3 MG INTO THE SKIN ONCE A DAY 11/16/20 11/16/21  Philemon Kingdom, MD  Molnupiravir 200 MG CAPS TAKE 4 CAPSULES BY MOUTH EVERY 12 HOURS FOR 5 DAYS 12/12/20 12/12/21  Philemon Kingdom, MD  Multiple Vitamins-Minerals (BARIATRIC FUSION) CHEW Chew 1 tablet by mouth 3 (three) times daily.    [provider]  OxyCODONE HCl 10 MG/0.5ML CONC Take 5-10 mLs by mouth every 4 (four) hours as needed. Take  5 to 10 ml every 4 hours as needed.    [provider]  pantoprazole (PROTONIX) 40 MG tablet Take 40 mg by mouth daily. 09/10/17   [provider]  pantoprazole (PROTONIX) 40 MG tablet TAKE 1 TABLET BY MOUTH ONCE DAILY 11/16/20 11/16/21  Philemon Kingdom, MD  pantoprazole (PROTONIX) 40 MG tablet TAKE 1 TABLET BY MOUTH ONCE A DAY 04/07/20 04/07/21  Philemon Kingdom, MD  penicillin v potassium (VEETID) 500 MG tablet TAKE 2 TABLETS BY MOUTH NOW  THEN TAKE 1 TABLET 4 TIMES DAILY UNTIL ALL TAKEN 11/22/20 11/22/21  Nicola Police, DMD    Allergies Keflex [cephalexin]  Family History  Problem Relation Age of Onset   Hypertension Mother    Hypertension Father    CAD Father    Heart disease Maternal Grandmother    Hypertension Maternal Grandmother    Stroke Maternal Grandmother    Cancer Maternal Grandmother        breast colon   Atrial fibrillation Maternal Grandmother    Valvular heart disease Maternal Grandmother    Hypertension Maternal Grandfather    Diabetes Maternal Grandfather    Hypertension Paternal Grandmother    Cancer Paternal Grandmother        kidney   Hypertension Paternal Grandfather     Social History Social History   Tobacco Use   Smoking status: Former    Packs/day: 1.00    Years: 10.00    Pack years:  10.00    Types: Cigarettes    Quit date: 07/29/2014    Years since quitting: 6.8   Smokeless tobacco: Never  Vaping Use   Vaping Use: Never used  Substance Use Topics   Alcohol use: No   Drug use: No     Review of Systems  Constitutional: No fever/chills Eyes:  No discharge ENT: No upper respiratory complaints. Respiratory: no cough. No SOB/ use of accessory muscles to breath Gastrointestinal:   No nausea, no vomiting.  No diarrhea.  No constipation. Musculoskeletal: Patient has left knee pain.  Skin: Negative for rash, abrasions, lacerations, ecchymosis.    ____________________________________________   PHYSICAL EXAM:  VITAL SIGNS: ED Triage Vitals  Enc Vitals Group     BP --      Pulse Rate 05/27/21 1934 85     Resp 05/27/21 1934 20     Temp 05/27/21 1934 98.2 F (36.8 C)     Temp Source 05/27/21 1934 Oral     SpO2 05/27/21 1934 98 %     Weight 05/27/21 1931 270 lb 1 oz (122.5 kg)     Height 05/27/21 1931 5\' 6"  (1.676 m)     Head Circumference --      Peak Flow --      Pain Score 05/27/21 1930 8     Pain Loc --      Pain Edu? --      Excl. in GC? --       Constitutional: Alert and oriented. Well appearing and in no acute distress. Eyes: Conjunctivae are normal. PERRL. EOMI. Head: Atraumatic. ENT: Cardiovascular: Normal rate, regular rhythm. Normal S1 and S2.  Good peripheral circulation. Respiratory: Normal respiratory effort without tachypnea or retractions. Lungs CTAB. Good air entry to the bases with no decreased or absent breath sounds Gastrointestinal: Bowel sounds x 4 quadrants. Soft and nontender to palpation. No guarding or rigidity. No distention. Musculoskeletal: Provocative testing of the left knee limited due to pain.  Palpable dorsalis pedis pulse bilaterally and symmetrically.  Capillary refill less than 2 seconds on the left. Neurologic:  Normal for age. No gross focal neurologic deficits are appreciated.  Skin:  Skin is warm, dry and intact. No rash noted. Psychiatric: Mood and affect are normal for age. Speech and behavior are normal.   ____________________________________________   LABS (all labs ordered are listed, but only abnormal results are displayed)  Labs Reviewed - No data to display ____________________________________________  EKG   ____________________________________________  RADIOLOGY 05/29/21, personally viewed and evaluated these images (plain radiographs) as part of my medical decision making, as well as reviewing the written report by the radiologist.  DG Knee Complete 4 Views Left  Result Date: 05/27/2021 CLINICAL DATA:  Left knee pain following fall, initial encounter EXAM: LEFT KNEE - COMPLETE 4+ VIEW COMPARISON:  None. FINDINGS: No evidence of fracture, dislocation, or joint effusion. No evidence of arthropathy or other focal bone abnormality. Soft tissues are unremarkable. IMPRESSION: No acute abnormality noted. Electronically Signed   By: 05/29/2021 M.D.   On: 05/27/2021 19:47    ____________________________________________    PROCEDURES  Procedure(s) performed:      Procedures     Medications  ketorolac (TORADOL) 30 MG/ML injection 30 mg (30 mg Intramuscular Given 05/27/21 2209)  oxyCODONE-acetaminophen (PERCOCET/ROXICET) 5-325 MG per tablet 1 tablet (1 tablet Oral Given 05/27/21 2209)  ondansetron (ZOFRAN-ODT) disintegrating tablet 4 mg (4 mg Oral Given 05/27/21 2209)     ____________________________________________   INITIAL  IMPRESSION / ASSESSMENT AND PLAN / ED COURSE  Pertinent labs & imaging results that were available during my care of the patient were reviewed by me and considered in my medical decision making (see chart for details).      Assessment and plan Left knee pain 35 year old female presents to the emergency department after a mechanical fall.  Vital signs are reassuring at triage.  On physical exam, patient was alert, active and nontoxic-appearing.  X-ray of the left knee showed no bony abnormality.  Patient was placed in a knee immobilizer and crutches were provided.  Patient was discharged with a short course of Norco for pain.  Patient personally requested to be referred to Dr. Joice Lofts.  All patient questions were answered.    ____________________________________________  FINAL CLINICAL IMPRESSION(S) / ED DIAGNOSES  Final diagnoses:  Acute pain of left knee      NEW MEDICATIONS STARTED DURING THIS VISIT:  ED Discharge Orders          Ordered    HYDROcodone-acetaminophen (NORCO) 5-325 MG tablet  Every 6 hours PRN        05/27/21 2155    ondansetron (ZOFRAN ODT) 4 MG disintegrating tablet  Every 8 hours PRN        05/27/21 2155                This chart was dictated using voice recognition software/Dragon. Despite best efforts to proofread, errors can occur which can change the meaning. Any change was purely unintentional.     Gasper Lloyd 05/27/21 2321    Sharman Cheek, MD 05/27/21 [phone]

## 2021-05-27 NOTE — ED Notes (Signed)
Pt NAD, a/ox4. Knee immobilizer applied. Pt demonstrates crutch use back. Pt and spouse verbalize understanding how to apply knee immobilizer. Pt walks with crutches and steady gait to lobby.

## 2021-05-28 ENCOUNTER — Inpatient Hospital Stay
Admission: EM | Admit: 2021-05-28 | Discharge: 2021-06-02 | DRG: 563 | Disposition: A | Discharge status: Home Health | Payer: 59 | Attending: Internal Medicine | Admitting: Internal Medicine

## 2021-05-28 ENCOUNTER — Emergency Department: Payer: 59

## 2021-05-28 ENCOUNTER — Observation Stay: Payer: 59

## 2021-05-28 DIAGNOSIS — Z043 Encounter for examination and observation following other accident: Secondary | ICD-10-CM | POA: Diagnosis not present

## 2021-05-28 DIAGNOSIS — S82101A Unspecified fracture of upper end of right tibia, initial encounter for closed fracture: Secondary | ICD-10-CM | POA: Diagnosis not present

## 2021-05-28 DIAGNOSIS — S82142A Displaced bicondylar fracture of left tibia, initial encounter for closed fracture: Secondary | ICD-10-CM | POA: Diagnosis not present

## 2021-05-28 DIAGNOSIS — M25561 Pain in right knee: Secondary | ICD-10-CM

## 2021-05-28 DIAGNOSIS — S82131A Displaced fracture of medial condyle of right tibia, initial encounter for closed fracture: Secondary | ICD-10-CM | POA: Diagnosis not present

## 2021-05-28 DIAGNOSIS — S82141A Displaced bicondylar fracture of right tibia, initial encounter for closed fracture: Secondary | ICD-10-CM | POA: Diagnosis not present

## 2021-05-28 DIAGNOSIS — S82001A Unspecified fracture of right patella, initial encounter for closed fracture: Secondary | ICD-10-CM | POA: Diagnosis not present

## 2021-05-28 DIAGNOSIS — S83512A Sprain of anterior cruciate ligament of left knee, initial encounter: Secondary | ICD-10-CM | POA: Diagnosis not present

## 2021-05-28 DIAGNOSIS — S82009A Unspecified fracture of unspecified patella, initial encounter for closed fracture: Secondary | ICD-10-CM | POA: Diagnosis present

## 2021-05-28 DIAGNOSIS — M25462 Effusion, left knee: Secondary | ICD-10-CM | POA: Diagnosis not present

## 2021-05-28 DIAGNOSIS — S82102A Unspecified fracture of upper end of left tibia, initial encounter for closed fracture: Secondary | ICD-10-CM | POA: Diagnosis not present

## 2021-05-28 LAB — CBC WITH DIFFERENTIAL/PLATELET
Abs Immature Granulocytes: 0.03 10*3/uL (ref 0.00–0.07)
Basophils Absolute: 0.1 10*3/uL (ref 0.0–0.1)
Basophils Relative: 1 %
Eosinophils Absolute: 0.2 10*3/uL (ref 0.0–0.5)
Eosinophils Relative: 2 %
HCT: 35.6 % — ABNORMAL LOW (ref 36.0–46.0)
Hemoglobin: 11.4 g/dL — ABNORMAL LOW (ref 12.0–15.0)
Immature Granulocytes: 0 %
Lymphocytes Relative: 19 %
Lymphs Abs: 1.7 10*3/uL (ref 0.7–4.0)
MCH: 26.8 pg (ref 26.0–34.0)
MCHC: 32 g/dL (ref 30.0–36.0)
MCV: 83.6 fL (ref 80.0–100.0)
Monocytes Absolute: 0.6 10*3/uL (ref 0.1–1.0)
Monocytes Relative: 6 %
Neutro Abs: 6.8 10*3/uL (ref 1.7–7.7)
Neutrophils Relative %: 72 %
Platelets: 330 10*3/uL (ref 150–400)
RBC: 4.26 MIL/uL (ref 3.87–5.11)
RDW: 14.6 % (ref 11.5–15.5)
WBC: 9.4 10*3/uL (ref 4.0–10.5)
nRBC: 0 % (ref 0.0–0.2)

## 2021-05-28 LAB — RESP PANEL BY RT-PCR (FLU A&B, COVID) ARPGX2
Influenza A by PCR: NEGATIVE
Influenza B by PCR: NEGATIVE
SARS Coronavirus 2 by RT PCR: NEGATIVE

## 2021-05-28 LAB — BASIC METABOLIC PANEL
Anion gap: 7 (ref 5–15)
BUN: 12 mg/dL (ref 6–20)
CO2: 27 mmol/L (ref 22–32)
Calcium: 8.3 mg/dL — ABNORMAL LOW (ref 8.9–10.3)
Chloride: 103 mmol/L (ref 98–111)
Creatinine, Ser: 0.65 mg/dL (ref 0.44–1.00)
GFR, Estimated: >60 mL/min (ref >60)
Glucose, Bld: 111 mg/dL — ABNORMAL HIGH (ref 70–99)
Potassium: 3.7 mmol/L (ref 3.5–5.1)
Sodium: 137 mmol/L (ref 135–145)

## 2021-05-28 IMAGING — CT CT KNEE*R* W/O CM
1 series · 15 of 25 positions shown, 17 images · non-contrast
Comparison: Right knee x-rays from same day.

CLINICAL DATA: Right knee pain after fall.

EXAM:
CT OF THE RIGHT KNEE WITHOUT CONTRAST
TECHNIQUE: Multidetector CT imaging of the right knee was performed according
to the standard protocol. Multiplanar CT image reconstructions were
also generated.

[Series 6: sagittal bone · sagittal · 0.40mm/px · 15 of 123 slices shown, 17 images]
[im 10/123  soft-tissue]
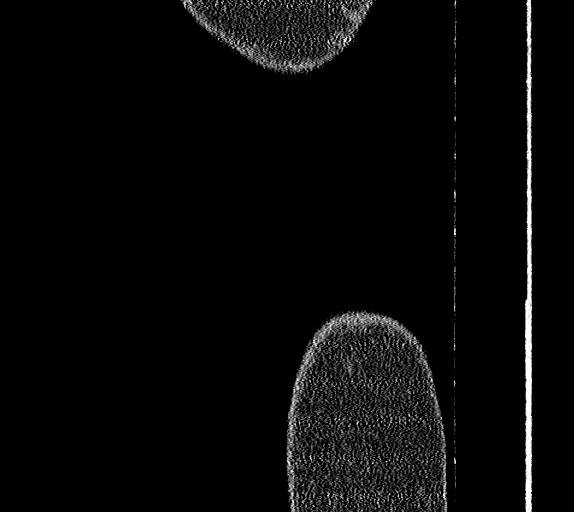
[im 10/123  bone]
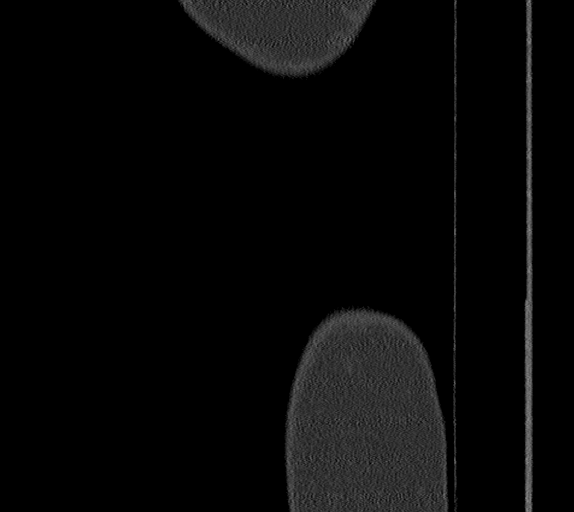
[im 19/123  bone]
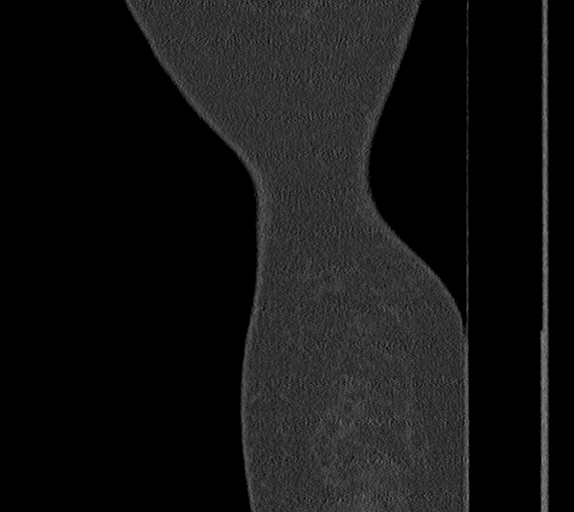
[im 21/123  bone]
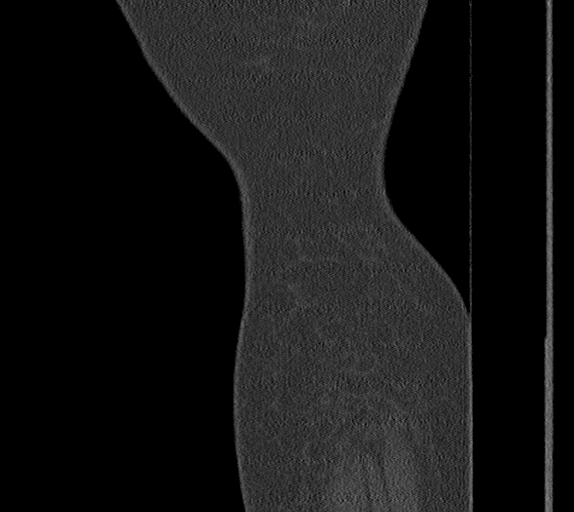
[im 31/123  bone]
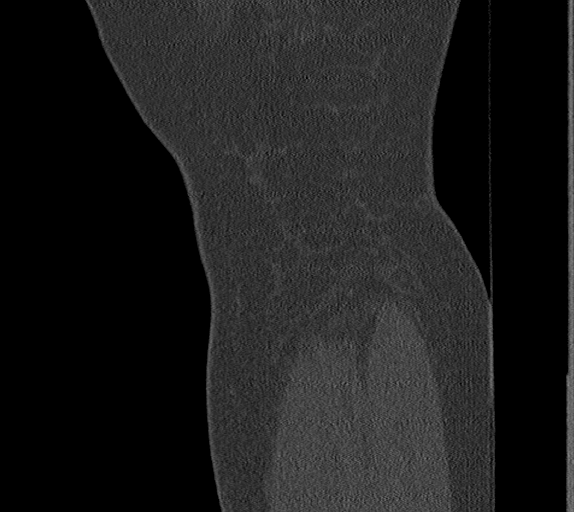
[im 38/123  soft-tissue]
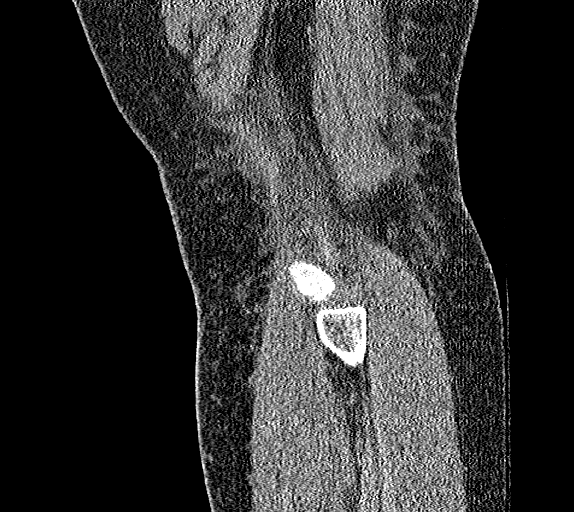
[im 38/123  bone]
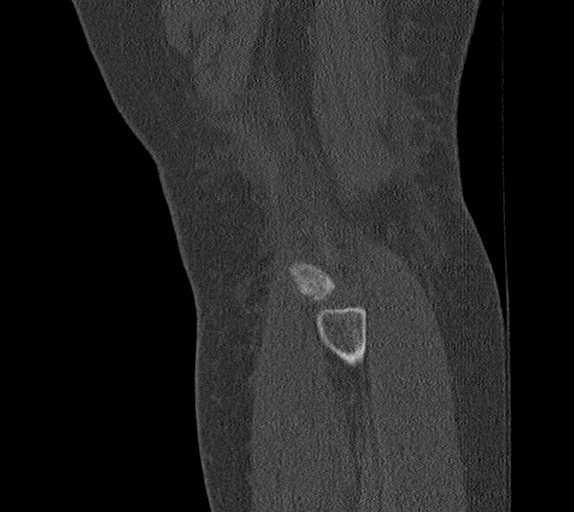
[im 47/123  bone]
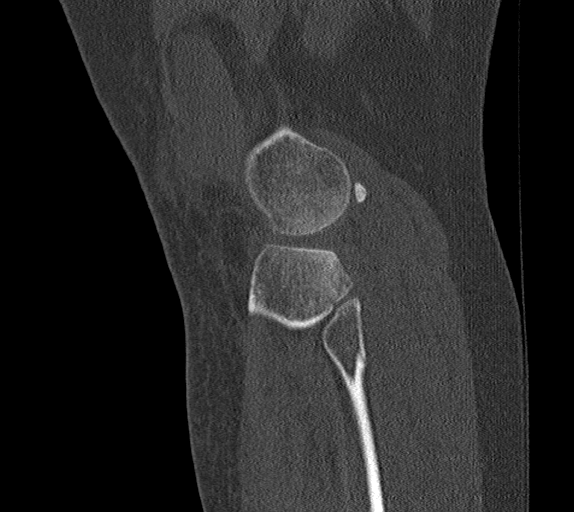
[im 51/123  bone]
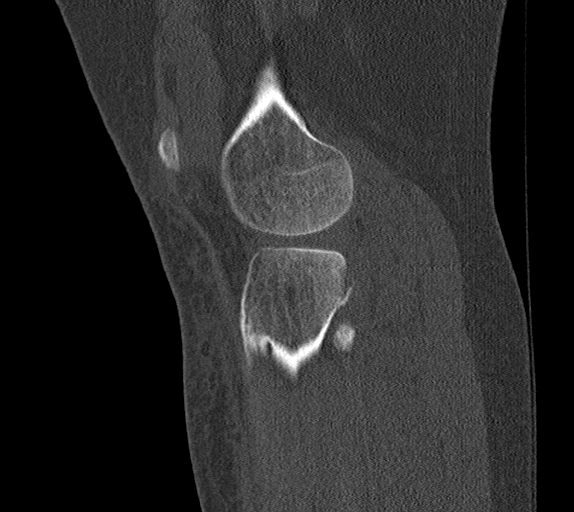
[im 62/123  bone]
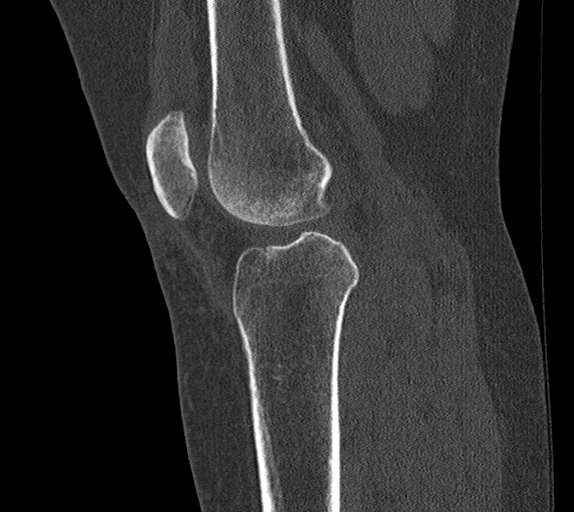
[im 72/123  bone]
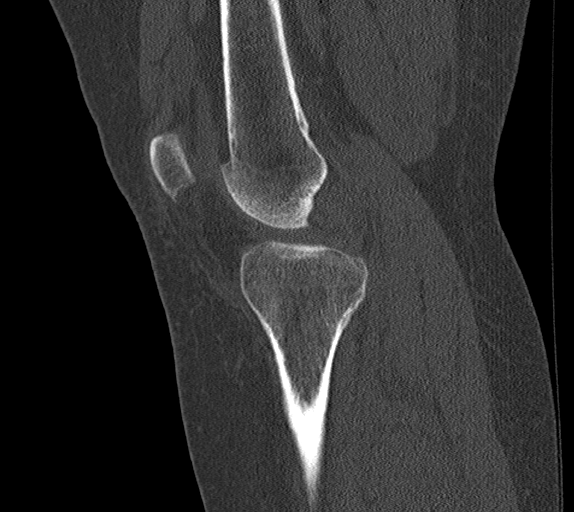
[im 76/123  bone]
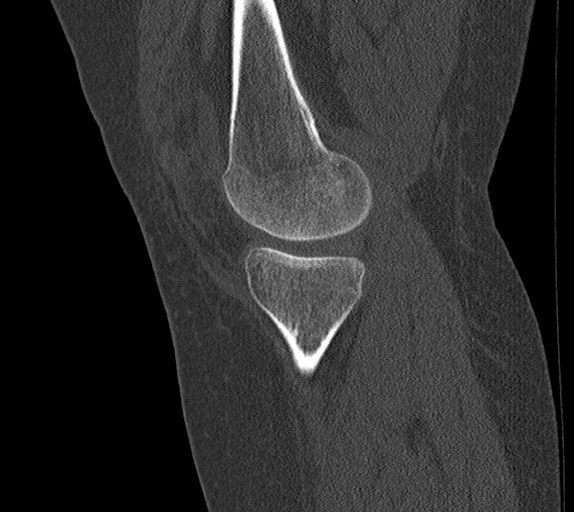
[im 85/123  bone]
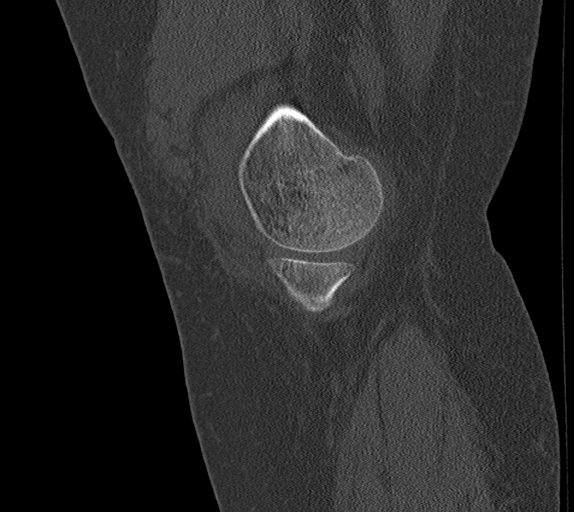
[im 92/123  bone]
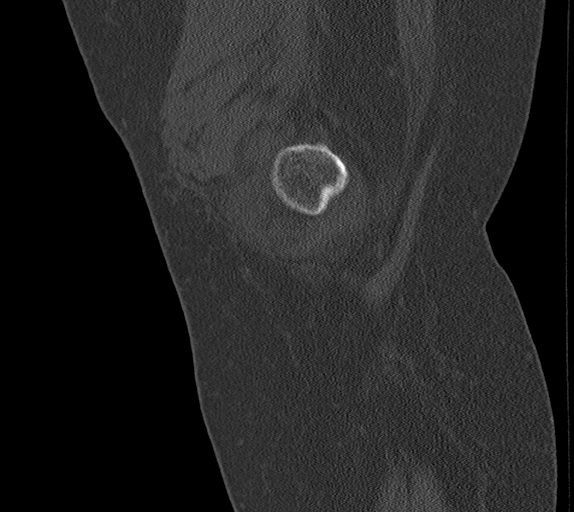
[im 102/123  bone]
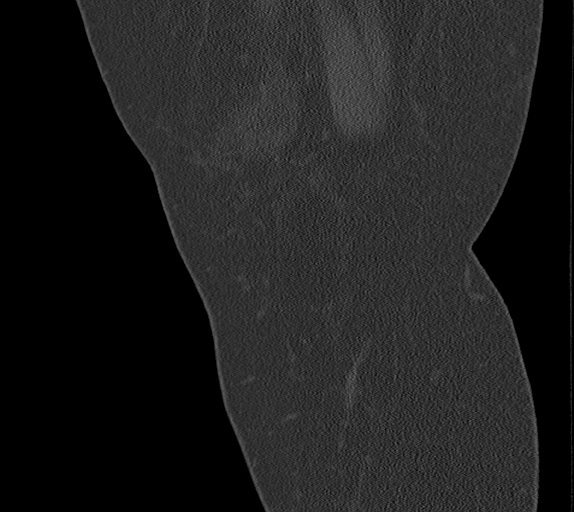
[im 104/123  bone]
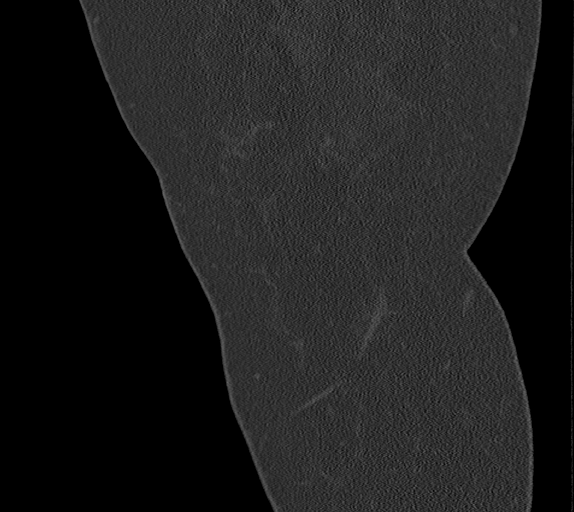
[im 113/123  bone]
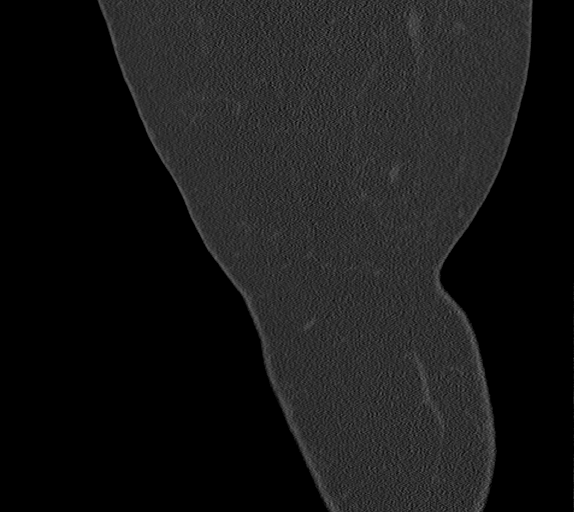

[15 of 25 positions shown; findings below may reference images not displayed]

FINDINGS: Bones/Joint/Cartilage

Acute mildly depressed fracture of the posterior lateral tibial
plateau. Acute avulsion fracture of the nonarticular lateral tibial
plateau. Acute tiny avulsion fracture of the inferomedial patella.
Lateral patellar subluxation. No dislocation. Joint spaces are
preserved. Small to moderate lipohemarthrosis.

Ligaments

Ligaments are suboptimally evaluated by CT.

Muscles and Tendons
Grossly intact.

Soft tissue
No fluid collection or hematoma.  No soft tissue mass.
IMPRESSION: 1. Acute fractures of the lateral tibial plateau as described above.
2. Acute tiny avulsion fracture of the inferomedial patella.
3. Small to moderate lipohemarthrosis.

## 2021-05-28 IMAGING — MR MR KNEE*L* W/O CM
7 series · 40 of 40 positions shown · non-contrast
Comparison: CT [DATE], radiograph [DATE]

CLINICAL DATA: Knee trauma, meniscal/ligament injury suspected,
xray done (Age >= 1y) Tibial plateau fracture (Age >= 1y)

EXAM:
MRI OF THE LEFT KNEE WITHOUT CONTRAST
TECHNIQUE: Multiplanar, multisequence MR imaging of the knee was performed. No
intravenous contrast was administered.

[Series 4: PD fat-sat · axial · left · 3.0mm · 0.53mm/px · z∈[-84,+31]mm · 6 of 36 slices shown (1 of 4)]
[im 1/36]
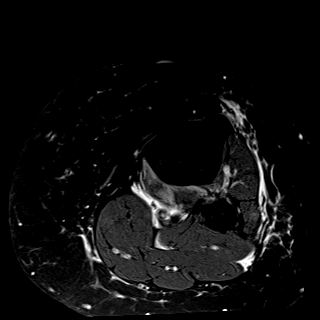
[im 8/36]
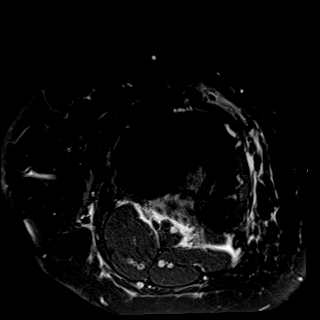
[im 15/36]
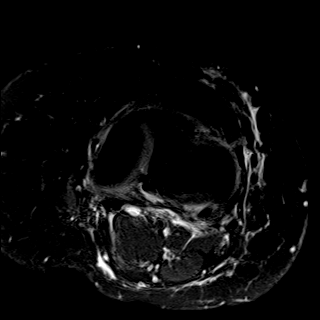
[im 22/36]
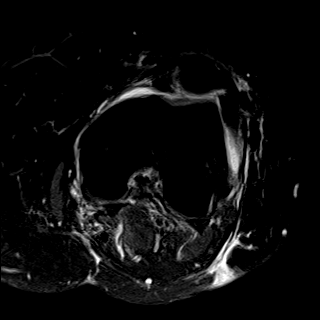
[im 29/36]
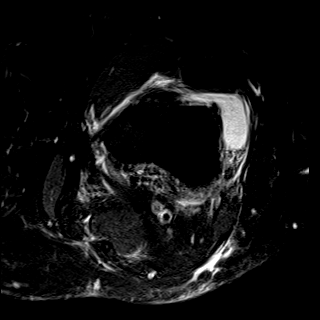
[im 36/36]
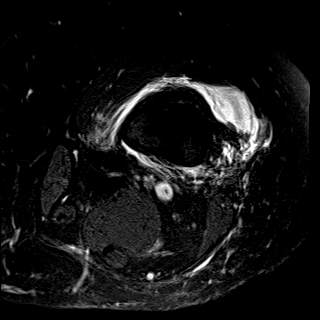

[Series 5: PD fat-sat · axial · left · 3.0mm · 0.53mm/px · z∈[-69,+46]mm · 6 of 36 slices shown (2 of 4)]
[im 1/36]
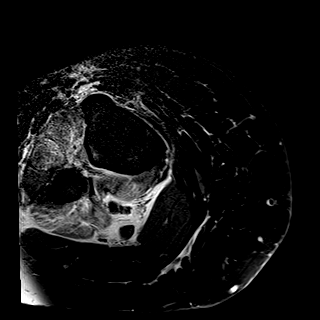
[im 8/36]
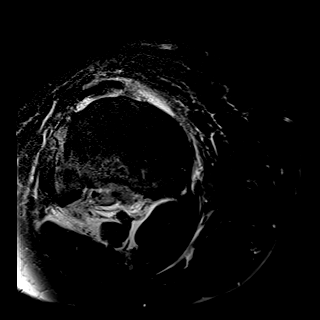
[im 15/36]
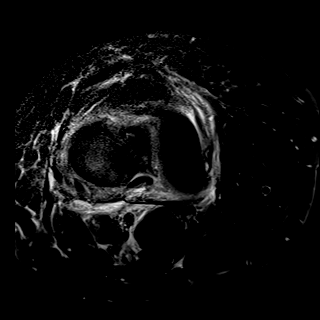
[im 22/36]
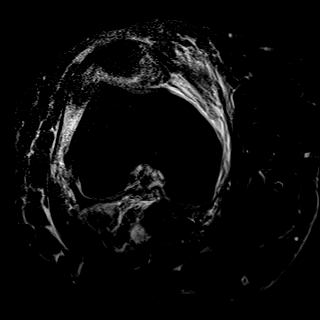
[im 29/36]
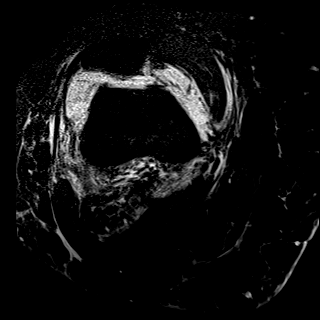
[im 36/36]
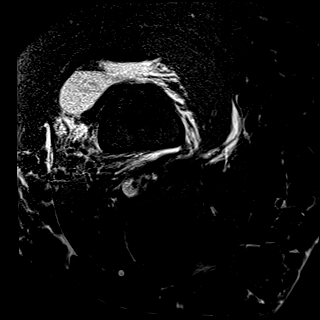

[Series 5: T1 · coronal · left · 3.0mm · 0.56mm/px · 5 of 34 slices shown]
[im 1/34]
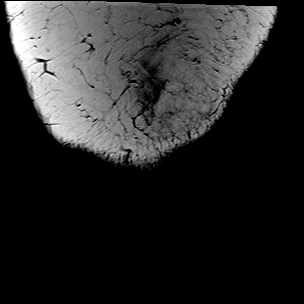
[im 9/34]
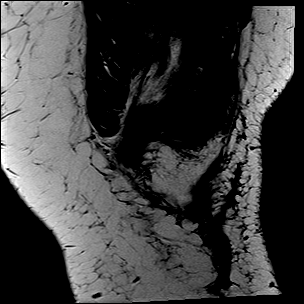
[im 17/34]
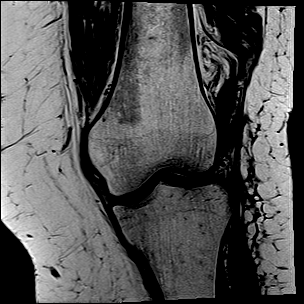
[im 25/34]
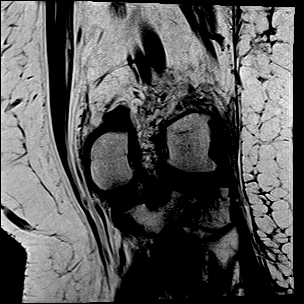
[im 34/34]
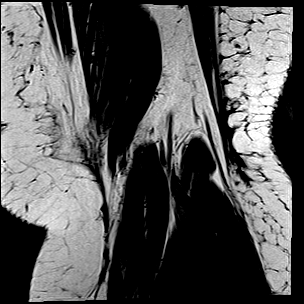

[Series 6: T2 fat-sat · coronal · left · 3.0mm · 0.66mm/px · 5 of 34 slices shown (1 of 2)]
[im 1/34]
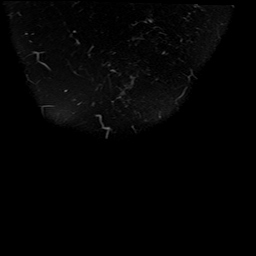
[im 9/34]
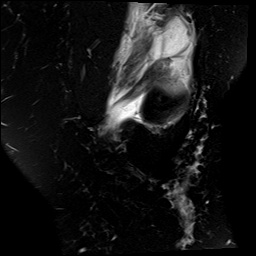
[im 17/34]
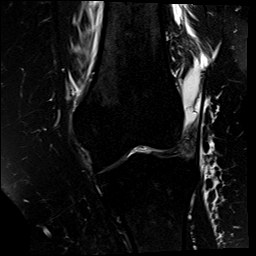
[im 25/34]
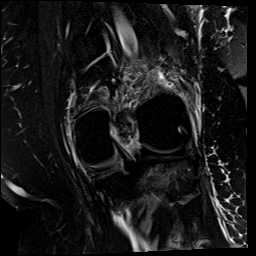
[im 34/34]
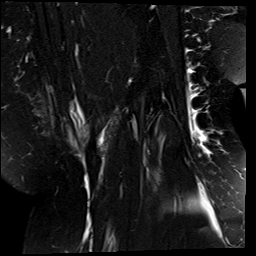

[Series 7: PD fat-sat · coronal · left · 3.0mm · 0.53mm/px · 6 of 36 slices shown (3 of 4)]
[im 1/36]
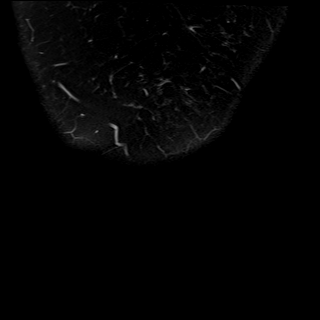
[im 8/36]
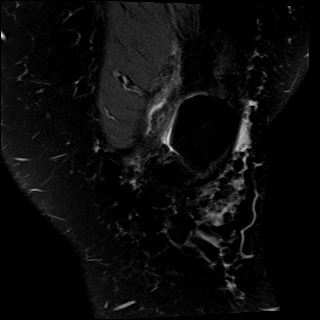
[im 15/36]
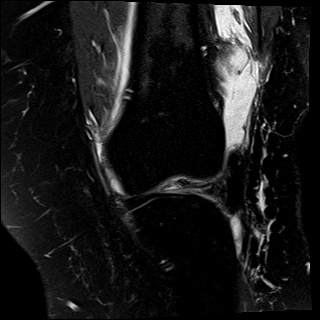
[im 22/36]
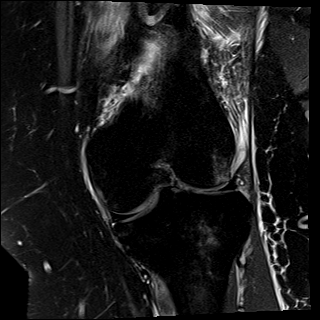
[im 29/36]
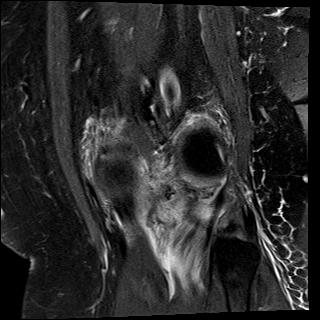
[im 36/36]
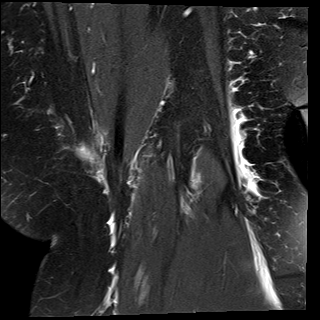

[Series 8: PD fat-sat · sagittal · left · 2.5mm · 0.59mm/px · 6 of 38 slices shown (4 of 4)]
[im 1/38]
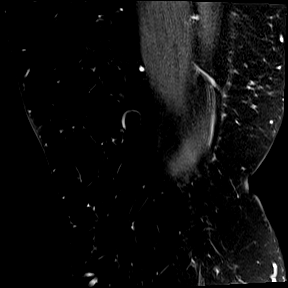
[im 8/38]
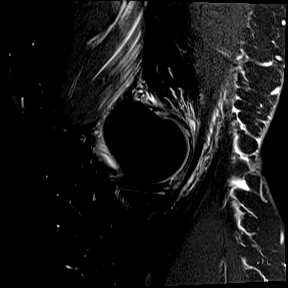
[im 15/38]
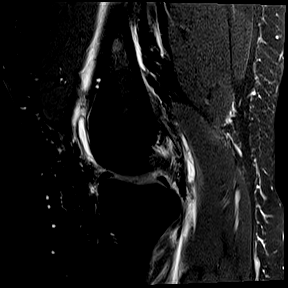
[im 23/38]
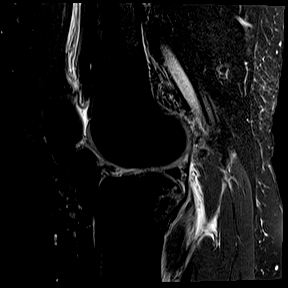
[im 30/38]
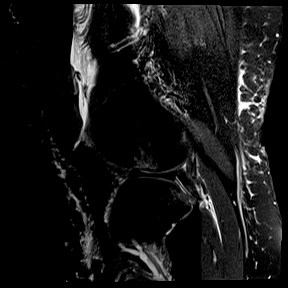
[im 38/38]
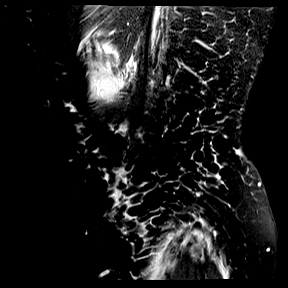

[Series 9: T2 fat-sat · sagittal · left · 2.5mm · 0.53mm/px · 6 of 36 slices shown (2 of 2)]
[im 1/36]
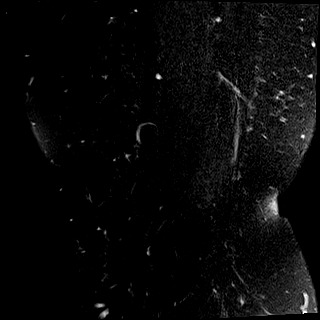
[im 8/36]
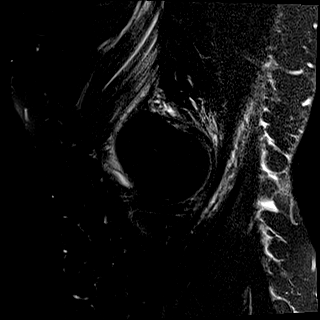
[im 15/36]
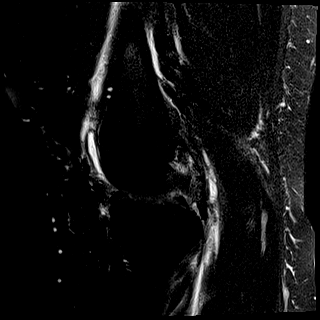
[im 22/36]
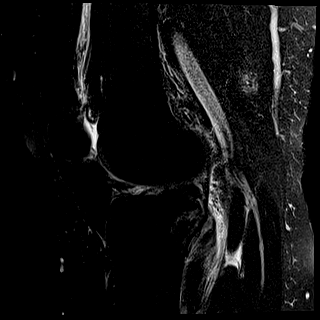
[im 29/36]
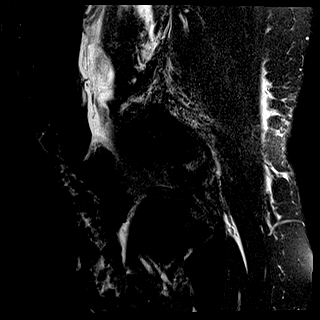
[im 36/36]
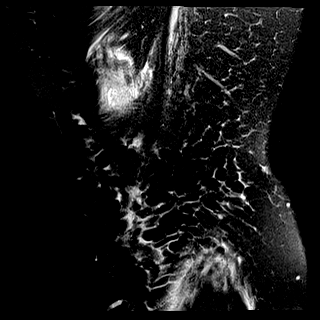

[40 of 40 positions shown; findings below may reference images not displayed]

FINDINGS: MENISCI

Medial: Intact.

Lateral: Intact.

LIGAMENTS

Cruciates: Complete tear of the proximal ACL.  The PCL is intact.

Collaterals: Medial collateral ligament is intact. The popliteus
tendon, lateral collateral ligament, and biceps femoris are intact.
There is extracapsular edema near the posterolateral corner.

CARTILAGE

Patellofemoral:  Mild chondrosis.

Medial:  No chondral defect.

Lateral:  No chondral defect.

JOINT: Moderate-sized joint effusion/hemarthrosis.

POPLITEAL FOSSA: Small Baker cyst.

EXTENSOR MECHANISM: Intact quadriceps tendon. Intact patellar
tendon. Intact lateral patellar retinaculum. Intact medial patellar
retinaculum. Intact MPFL.

BONES: Pivot shift bony contusion pattern in the lateral femoral
condyle and posterolateral tibial plateau. Acute, mild depressed
fracture of the posterolateral tibial plateau. Avulsion fracture of
the non articular lateral tibial plateau. Tiny subchondral fracture
of the lateral femoral condyle (coronal T1 image 14).

Other: No additional findings.
IMPRESSION: Complete proximal ACL tear with pivot shift bony contusion pattern.

Extracapsular edema along the posterolateral corner, likely related
to adjacent posterolateral tibial plateau fracture, but can also be
seen in the setting of a minor ligamentous SIMB injury. Intact
popliteus tendon, lateral collateral ligament, and biceps femoris.

Acute fractures of the lateral tibial plateau as described above,
better assessed on recent CT.

Tiny subchondral fracture of the lateral femoral condyle.

Moderate-sized joint effusion/hemarthrosis.

No evidence of meniscus tear.

## 2021-05-28 IMAGING — DX DG KNEE COMPLETE 4+V*R*
4 series · 4 of 4 positions shown · non-contrast
Comparison: None.

CLINICAL DATA: Fall

EXAM:
RIGHT KNEE - COMPLETE 4+ VIEW

[knee ap]
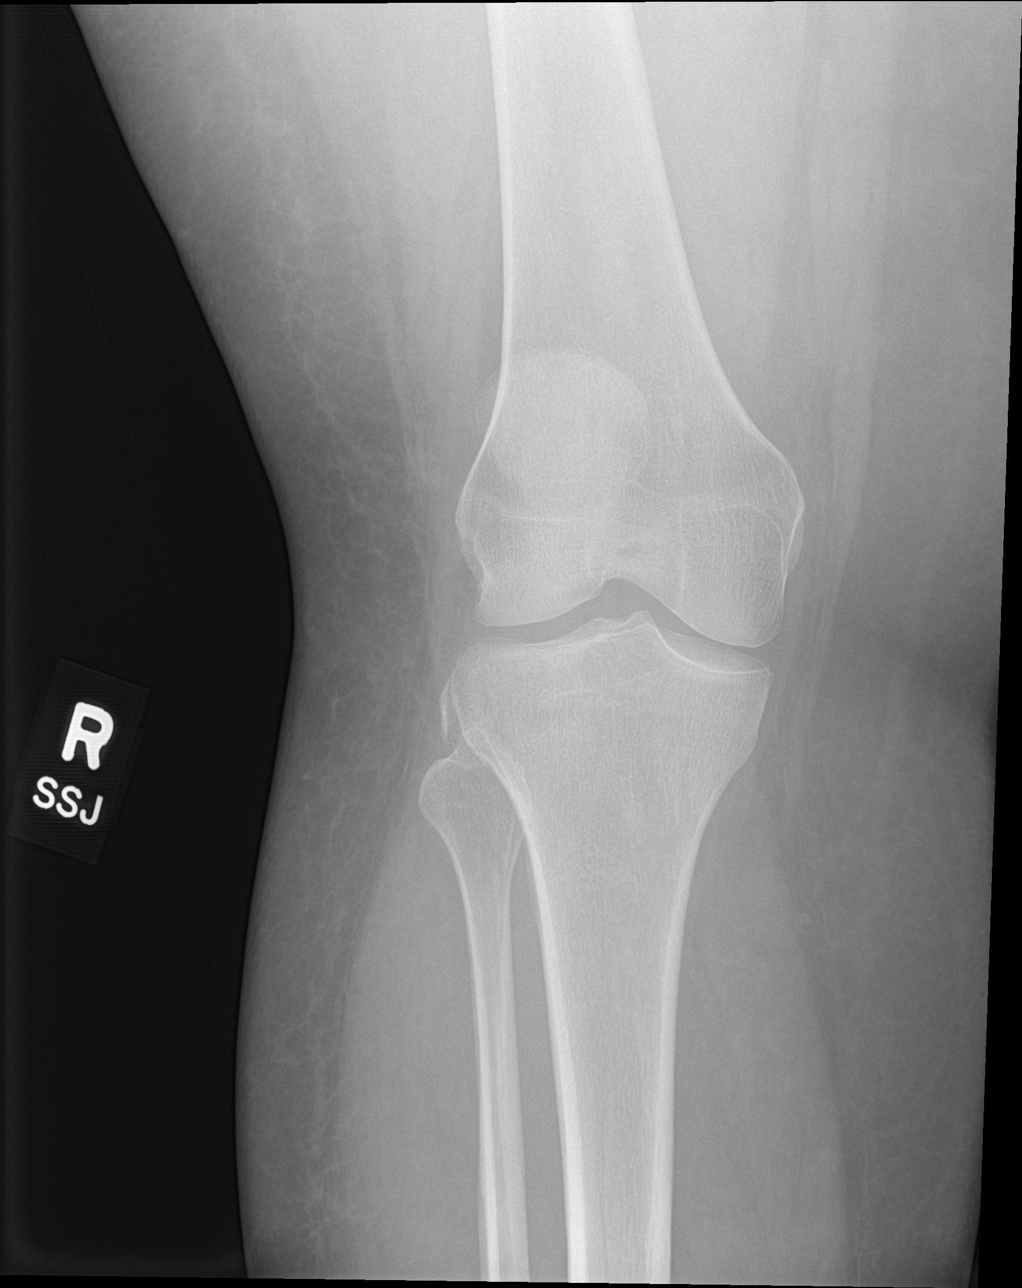

[knee lat]
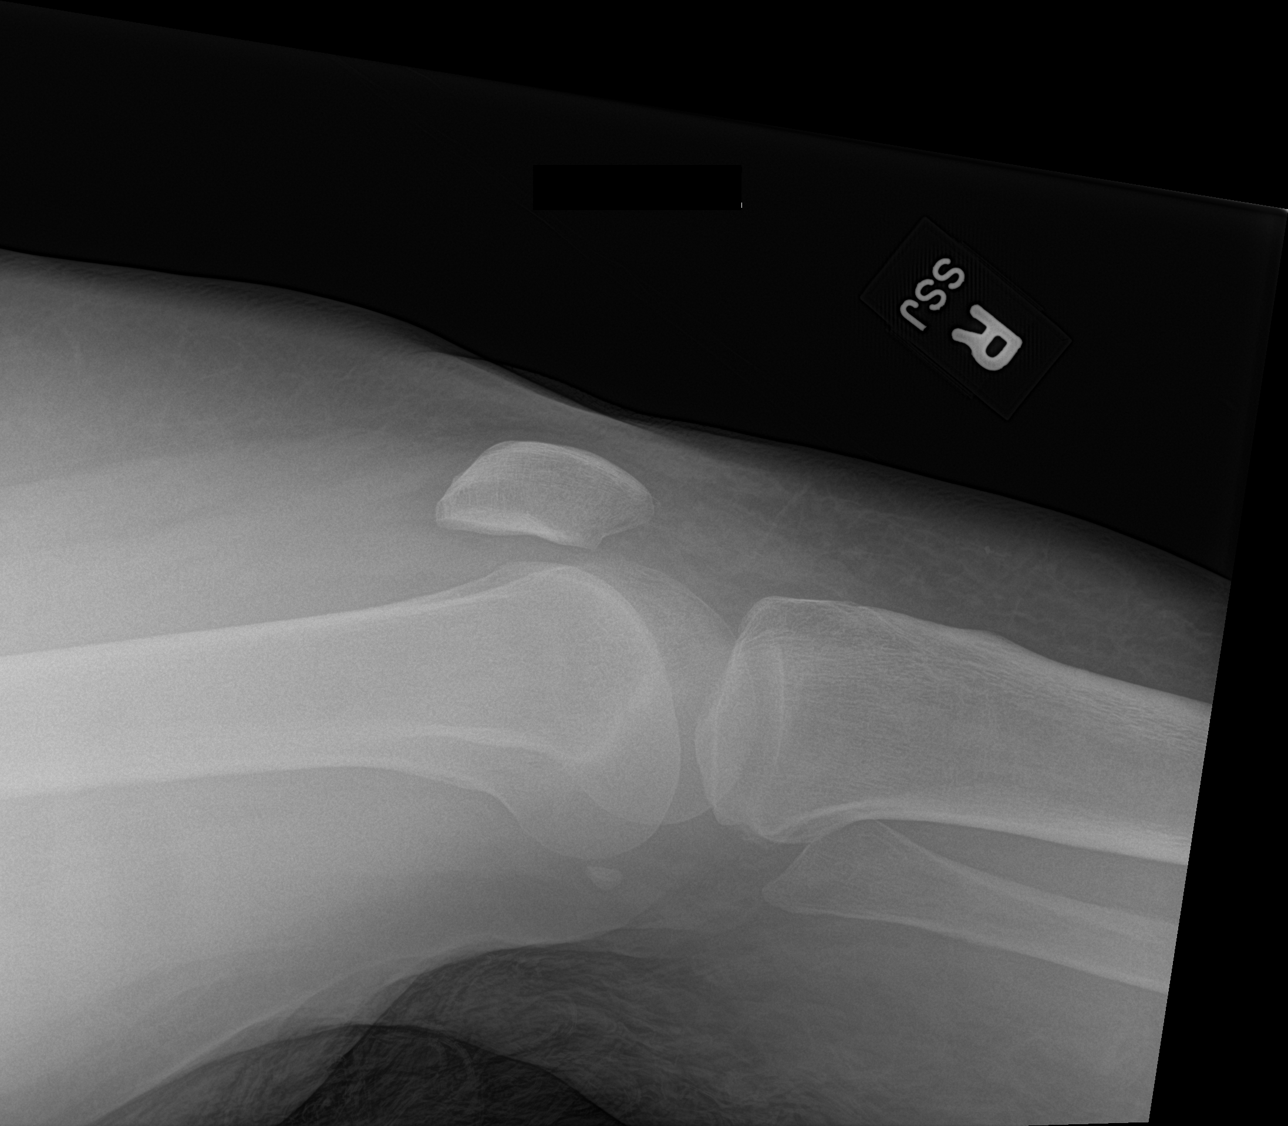

[knee obl (1 of 2)]
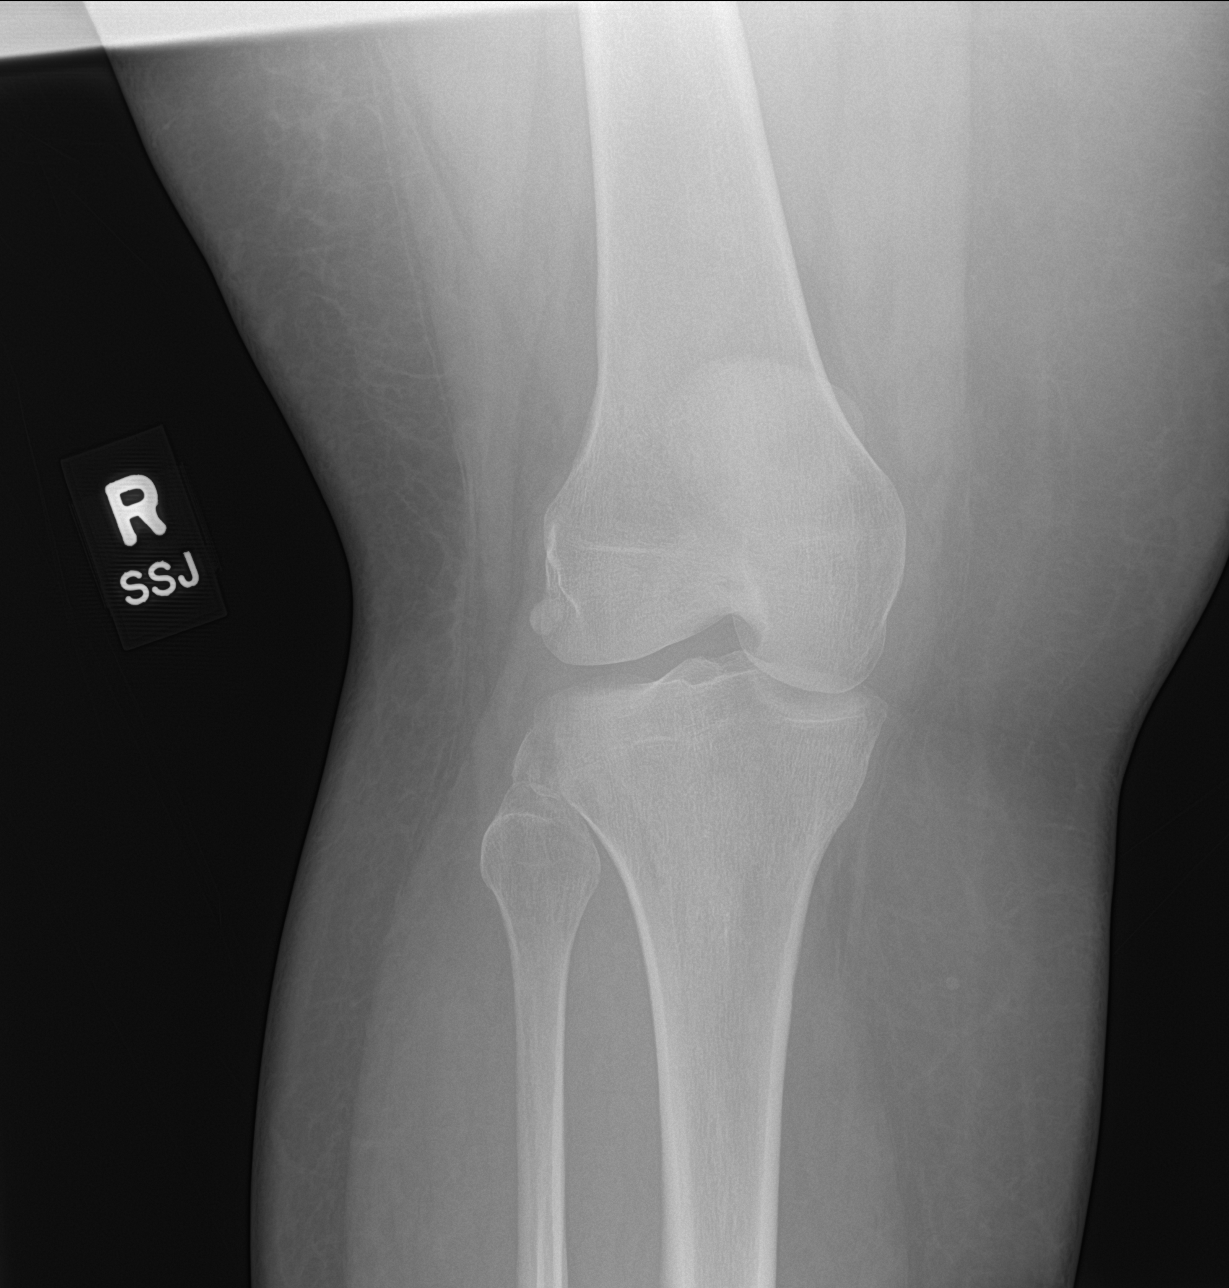

[knee obl (2 of 2)]
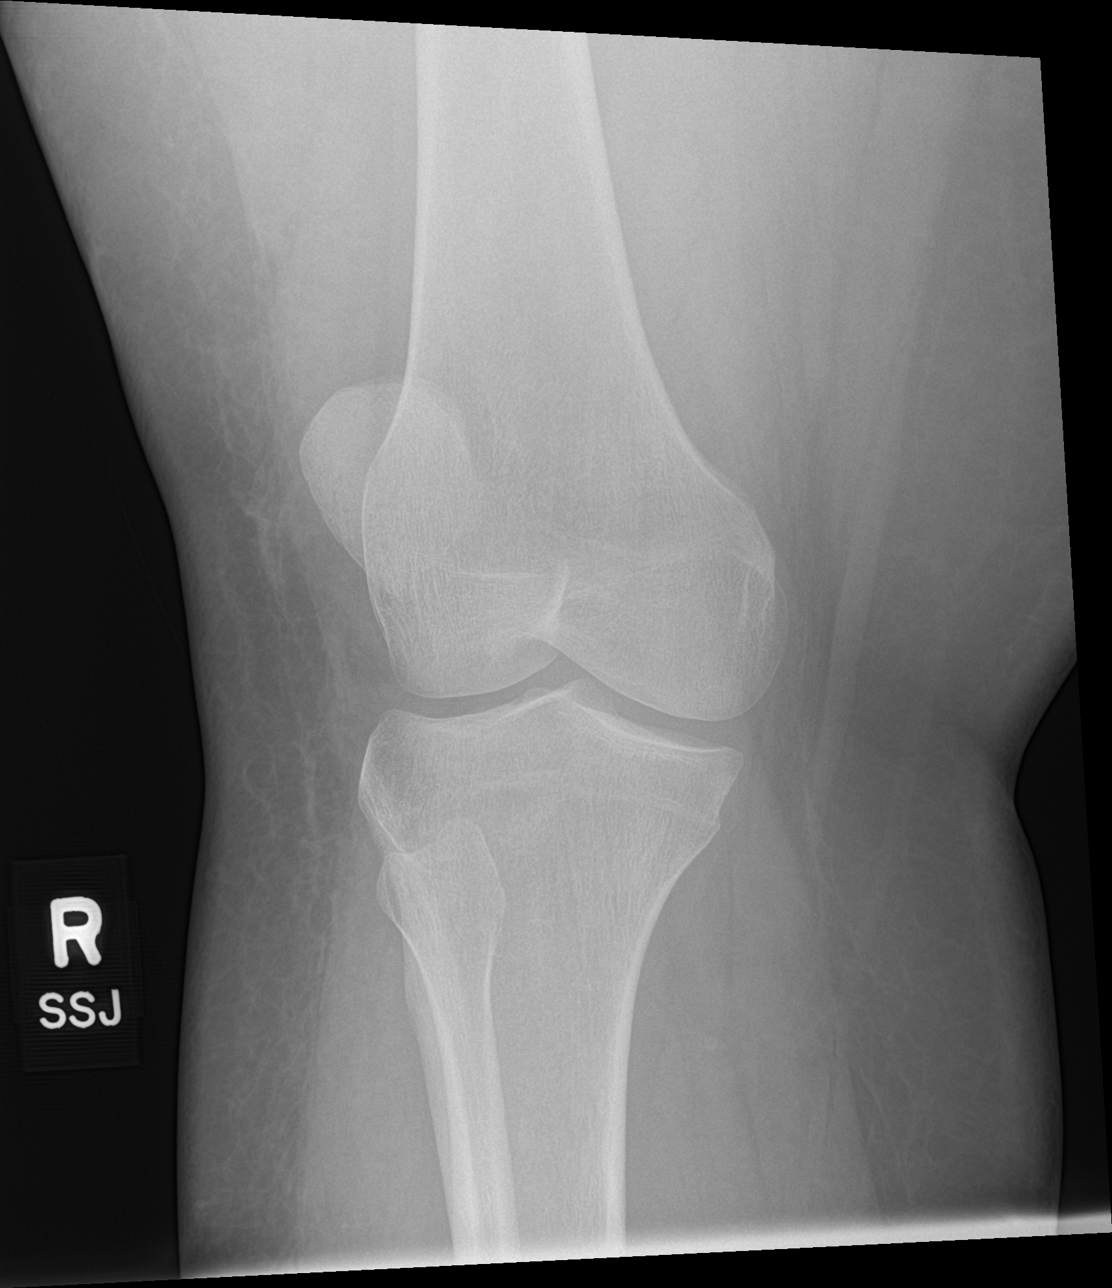

[4 of 4 positions shown; findings below may reference images not displayed]

FINDINGS: Abnormal appearance of the lateral aspect of the right proximal
tibia. No joint effusion. No dislocation.
IMPRESSION: Abnormal appearance of the lateral aspect of the right proximal
tibia, concerning for avulsion fracture. CT of the right knee
suggestive

## 2021-05-28 MED ORDER — ACETAMINOPHEN 325 MG PO TABS: 650.0000 mg | ORAL_TABLET | Freq: Four times a day (QID) | ORAL | Status: DC | PRN

## 2021-05-28 MED ORDER — ACETAMINOPHEN 500 MG PO TABS
1000.0000 mg | ORAL_TABLET | Freq: Three times a day (TID) | ORAL | Status: DC
  Administered 2021-05-28 – 2021-06-02 (×16): 1000 mg via ORAL
  Filled 2021-05-28 (×16): qty 2

## 2021-05-28 MED ORDER — CALCIUM CARBONATE 1250 (500 CA) MG PO TABS
2500.0000 mg | ORAL_TABLET | Freq: Every day | ORAL | Status: DC
  Administered 2021-05-28 – 2021-06-02 (×6): 2500 mg via ORAL
  Filled 2021-05-28 (×6): qty 2

## 2021-05-28 MED ORDER — ONDANSETRON HCL 4 MG PO TABS: 4.0000 mg | ORAL_TABLET | Freq: Four times a day (QID) | ORAL | Status: DC | PRN

## 2021-05-28 MED ORDER — HYDROCHLOROTHIAZIDE 12.5 MG PO CAPS
12.5000 mg | ORAL_CAPSULE | Freq: Every day | ORAL | Status: DC
  Administered 2021-05-28 – 2021-06-02 (×6): 12.5 mg via ORAL
  Filled 2021-05-28 (×6): qty 1

## 2021-05-28 MED ORDER — HYDRALAZINE HCL 20 MG/ML IJ SOLN: 10.0000 mg | Freq: Four times a day (QID) | INTRAMUSCULAR | Status: DC | PRN

## 2021-05-28 MED ORDER — OXYCODONE HCL 5 MG PO TABS
5.0000 mg | ORAL_TABLET | ORAL | Status: DC | PRN
  Administered 2021-05-28 – 2021-05-30 (×8): 5 mg via ORAL
  Filled 2021-05-28 (×8): qty 1

## 2021-05-28 MED ORDER — ALBUTEROL SULFATE (2.5 MG/3ML) 0.083% IN NEBU: 2.5000 mg | INHALATION_SOLUTION | RESPIRATORY_TRACT | Status: DC | PRN

## 2021-05-28 MED ORDER — FLUOXETINE HCL 20 MG PO CAPS
40.0000 mg | ORAL_CAPSULE | Freq: Every day | ORAL | Status: DC
  Administered 2021-05-28 – 2021-06-02 (×6): 40 mg via ORAL
  Filled 2021-05-28 (×6): qty 2

## 2021-05-28 MED ORDER — SENNA 8.6 MG PO TABS
1.0000 | ORAL_TABLET | Freq: Two times a day (BID) | ORAL | Status: DC
  Administered 2021-05-28 – 2021-06-02 (×10): 8.6 mg via ORAL
  Filled 2021-05-28 (×11): qty 1

## 2021-05-28 MED ORDER — PANTOPRAZOLE SODIUM 40 MG PO TBEC
40.0000 mg | DELAYED_RELEASE_TABLET | Freq: Every day | ORAL | Status: DC
  Administered 2021-05-28 – 2021-06-02 (×6): 40 mg via ORAL
  Filled 2021-05-28 (×6): qty 1

## 2021-05-28 MED ORDER — ACETAMINOPHEN 650 MG RE SUPP: 650.0000 mg | Freq: Four times a day (QID) | RECTAL | Status: DC | PRN

## 2021-05-28 MED ORDER — KETOROLAC TROMETHAMINE 30 MG/ML IJ SOLN
15.0000 mg | Freq: Four times a day (QID) | INTRAMUSCULAR | Status: AC
  Administered 2021-05-28 – 2021-06-02 (×20): 15 mg via INTRAVENOUS
  Filled 2021-05-28 (×22): qty 1

## 2021-05-28 MED ORDER — LEVOTHYROXINE SODIUM 125 MCG PO TABS
125.0000 ug | ORAL_TABLET | Freq: Every day | ORAL | Status: DC
  Administered 2021-05-29 – 2021-06-02 (×5): 125 ug via ORAL
  Filled 2021-05-28 (×5): qty 1

## 2021-05-28 MED ORDER — FAMOTIDINE 20 MG PO TABS
20.0000 mg | ORAL_TABLET | Freq: Two times a day (BID) | ORAL | Status: DC
  Administered 2021-05-28 – 2021-06-02 (×11): 20 mg via ORAL
  Filled 2021-05-28 (×11): qty 1

## 2021-05-28 MED ORDER — ENOXAPARIN SODIUM 40 MG/0.4ML IJ SOSY: 40.0000 mg | PREFILLED_SYRINGE | INTRAMUSCULAR | Status: DC

## 2021-05-28 MED ORDER — ALPRAZOLAM 0.25 MG PO TABS: 0.2500 mg | ORAL_TABLET | Freq: Two times a day (BID) | ORAL | Status: DC | PRN

## 2021-05-28 MED ORDER — ENOXAPARIN SODIUM 80 MG/0.8ML IJ SOSY
0.5000 mg/kg | PREFILLED_SYRINGE | INTRAMUSCULAR | Status: DC
  Administered 2021-05-28 – 2021-06-01 (×5): 62.5 mg via SUBCUTANEOUS
  Filled 2021-05-28 (×7): qty 0.63

## 2021-05-28 MED ORDER — OXYCODONE-ACETAMINOPHEN 5-325 MG PO TABS
1.0000 | ORAL_TABLET | Freq: Once | ORAL | Status: AC
  Administered 2021-05-28: 1 via ORAL
  Filled 2021-05-28: qty 1

## 2021-05-28 MED ORDER — MORPHINE SULFATE (PF) 2 MG/ML IV SOLN
2.0000 mg | INTRAVENOUS | Status: DC | PRN
  Administered 2021-05-28 – 2021-05-31 (×5): 2 mg via INTRAVENOUS
  Filled 2021-05-28 (×5): qty 1

## 2021-05-28 MED ORDER — ONDANSETRON HCL 4 MG/2ML IJ SOLN
4.0000 mg | Freq: Four times a day (QID) | INTRAMUSCULAR | Status: DC | PRN
  Administered 2021-05-28 – 2021-05-30 (×2): 4 mg via INTRAVENOUS
  Filled 2021-05-28 (×2): qty 2

## 2021-05-28 NOTE — Progress Notes (Signed)
PT Cancellation Note  Patient Details Name: Taylor [name] MRN: [id number] DOB: 1986/06/16   Cancelled Treatment:    Reason Eval/Treat Not Completed: Other (comment). PT consult received, chart reviewed. Orders noted for bilateral knee immobilizers, unclear weight bearing orders. MD contacted. PT to attempt as able after clarification on weight bearing status/further workup.  Olga Coaster PT, DPT 9:33 AM,05/28/21

## 2021-05-28 NOTE — ED Notes (Signed)
Secure chat sent to floor 

## 2021-05-28 NOTE — ED Notes (Addendum)
Candace RN aware of assigned bed 

## 2021-05-28 NOTE — Progress Notes (Signed)
PHARMACIST - PHYSICIAN COMMUNICATION  CONCERNING:  Enoxaparin (Lovenox) for DVT Prophylaxis    RECOMMENDATION: Patient was prescribed enoxaprin 40mg  q24 hours for VTE prophylaxis.   Filed Weights   05/27/21 2353  Weight: 122.5 kg (270 lb)    Body mass index is 43.58 kg/m.  Estimated Creatinine Clearance: 131.1 mL/min (by C-G formula based on SCr of 0.65 mg/dL).   Based on [facility] policy patient is candidate for enoxaparin 0.5mg /kg TBW SQ every 24 hours based on BMI being >30.  DESCRIPTION: Pharmacy has adjusted enoxaparin dose per [facility] policy.  Patient is now receiving enoxaparin 62.5 mg every 24 hours    [facility], PharmD Clinical Pharmacist  05/28/2021 7:46 AM

## 2021-05-28 NOTE — Progress Notes (Signed)
OT Cancellation Note  Patient Details Name: Taylor [name] MRN: [id number] DOB: [birth year]   Cancelled Treatment:    Reason Eval/Treat Not Completed: Other (comment). Order received and chart reviewed. Pt pending formal ortho consult, will hold this AM and attempt at later date/time as able to initate services.   Kathie Dike, M.S. OTR/L  05/28/21, 11:47 AM  ascom [phone]

## 2021-05-28 NOTE — ED Provider Notes (Signed)
[facility] Emergency Department Provider Note  ____________________________________________   Event Date/Time   First MD Initiated Contact with Patient 05/28/21 0501     (approximate)  I have reviewed the triage vital signs and the nursing notes.   HISTORY  Chief Complaint Fall   HPI SIMRAT KENDRICK is a 35 y.o. female with past medical history of anxiety, HTN, depression, GERD and hypothyroidism who presents for assessment after a fall she sustained while leaving emergency room after initially being evaluated for pain in her left knee.  Patient states she initially was evaluated after twisting her knee and falling prior to arrival.  She states she felt immediate pain in her left knee and was not able to walk.  X-ray obtained of the left knee initial evaluation showed no clear fracture dislocation.  Concern for soft tissue injury i.e. ligamentous.  Patient was placed in a knee immobilizer and given crutches.  It seems that when she is going out of her car she states her right knee gave out from under her and she fell on her right knee twisting it.  She states she now has pain in the anterior aspect of the right knee rating around to the back.  He states that during other falls that she hit her head or experience any pain in her hips, ankles, upper extremities or elsewhere.  She is otherwise been in her usual state health without any recent fevers, chills, cough, nausea, vomiting, diarrhea, dysuria, rash or any other associated sick symptoms.         Past Medical History:  Diagnosis Date   Anxiety    Complication of anesthesia    Hard to put under anesthesia, combative when waking up, heart palpitations under anesthesia   Depression    Essential hypertension 08/08/2016   GERD (gastroesophageal reflux disease)    H/O back injury    broken back age 50   Hypertension    with pregnancy   Hypothyroidism     Patient Active Problem List   Diagnosis Date  Noted   S/P laparoscopic sleeve gastrectomy Dec 2018 09/16/2017   Morbid obesity (HCC) 09/16/2017   Essential hypertension 08/08/2016   PVCs (premature ventricular contractions) 08/06/2016   Cholecystitis 06/03/2016   Abdominal pain 05/28/2016   Gallstones    Normal pregnancy 11/21/2014   Echogenic intracardiac focus of fetus on prenatal ultrasound    Obesity complicating pregnancy    Pre-existing essential hypertension complicating pregnancy in third trimester    [redacted] weeks gestation of pregnancy    [redacted] weeks gestation of pregnancy    Chronic hypertension complicating or reason for care during pregnancy    Obesity in pregnancy, antepartum    [redacted] weeks gestation of pregnancy     Past Surgical History:  Procedure Laterality Date   CESAREAN SECTION N/A 11/22/2014   Procedure: CESAREAN SECTION;  Surgeon: Turner Daniels, MD;  Location: [facility];  Service: Obstetrics;  Laterality: N/A;   CHOLECYSTECTOMY N/A 05/29/2016   Procedure: LAPAROSCOPIC CHOLECYSTECTOMY;  Surgeon: Tiney Rouge III, MD;  Location: [facility] ORS;  Service: General;  Laterality: N/A;   LAPAROSCOPIC GASTRIC SLEEVE RESECTION N/A 09/16/2017   Procedure: LAPAROSCOPIC GASTRIC SLEEVE RESECTION, UPPER ENDO;  Surgeon: Luretha Murphy, MD;  Location: [facility];  Service: General;  Laterality: N/A;   LAPAROSCOPIC OVARIAN CYSTECTOMY Right    LAPAROSCOPY Left 08/06/2016   Procedure: LAPAROSCOPY DIAGNOSTIC with drainage of left ovarian cyst;  Surgeon: Marcelle Overlie, MD;  Location: [facility];  Service: Gynecology;  Laterality: Left;   WISDOM TOOTH EXTRACTION      Prior to Admission medications   Medication Sig Start Date End Date Taking? Authorizing Provider  ALPRAZolam (XANAX) 0.25 MG tablet Take 0.25 mg by mouth 2 (two) times daily as needed for anxiety.    [provider]  aspirin-acetaminophen-caffeine (EXCEDRIN MIGRAINE) [phone] MG tablet Take 2 tablets by mouth daily as needed for headache.    [provider]  calcium  carbonate (OS-CAL) 1250 (500 Ca) MG chewable tablet Chew 2 tablets by mouth daily.    [provider]  ergocalciferol (VITAMIN D2) 1.25 MG (50000 UT) capsule take 1 capsule by mouth Once a week 02/08/21     famotidine (PEPCID) 20 MG tablet TAKE 1 TABLET BY MOUTH FOR BREAKTHROUGH HEARTBURN TWICE A DAILY AS NEEDED 11/16/20 11/16/21  Philemon KingdomProchnau, Caroline, MD  FLUoxetine (PROZAC) 40 MG capsule Take 40 mg by mouth daily.    [provider]  FLUoxetine (PROZAC) 40 MG capsule TAKE 1 CAPSULE BY MOUTH ONCE DAILY IN THE MORNING 04/07/20 04/07/21  Philemon KingdomProchnau, Caroline, MD  hydrochlorothiazide (HYDRODIURIL) 25 MG tablet TAKE 1 TABLET BY MOUTH ONCE A DAY 04/07/20 04/07/21  Philemon KingdomProchnau, Caroline, MD  hydrochlorothiazide (MICROZIDE) 12.5 MG capsule Take 12.5 mg by mouth daily.    [provider]  HYDROcodone-acetaminophen (NORCO) 5-325 MG tablet Take 1 tablet by mouth every 6 (six) hours as needed for up to 3 days. 05/27/21 05/30/21  Orvil FeilWoods, Jaclyn M, PA-C  levonorgestrel (MIRENA) 20 MCG/24HR IUD 1 each by Intrauterine route once.    [provider]  levothyroxine (SYNTHROID) 112 MCG tablet TAKE 1 TABLET BY MOUTH ONCE A DAY 05/11/20 05/11/21  Philemon KingdomProchnau, Caroline, MD  levothyroxine (SYNTHROID, LEVOTHROID) 125 MCG tablet Take 125 mcg by mouth daily before breakfast.    [provider]  Liraglutide -Weight Management 18 MG/3ML SOPN INJECT 3 MG INTO THE SKIN ONCE A DAY 11/16/20 11/16/21  Philemon KingdomProchnau, Caroline, MD  Molnupiravir 200 MG CAPS TAKE 4 CAPSULES BY MOUTH EVERY 12 HOURS FOR 5 DAYS 12/12/20 12/12/21  Philemon KingdomProchnau, Caroline, MD  Multiple Vitamins-Minerals (BARIATRIC FUSION) CHEW Chew 1 tablet by mouth 3 (three) times daily.    [provider]  ondansetron (ZOFRAN ODT) 4 MG disintegrating tablet Take 1 tablet (4 mg total) by mouth every 8 (eight) hours as needed for up to 3 days. 05/27/21 05/30/21  Orvil FeilWoods, Jaclyn M, PA-C  OxyCODONE HCl 10 MG/0.5ML CONC Take 5-10 mLs by mouth every 4 (four) hours as  needed. Take  5 to 10 ml every 4 hours as needed.    [provider]  pantoprazole (PROTONIX) 40 MG tablet Take 40 mg by mouth daily. 09/10/17   [provider]  pantoprazole (PROTONIX) 40 MG tablet TAKE 1 TABLET BY MOUTH ONCE DAILY 11/16/20 11/16/21  Philemon KingdomProchnau, Caroline, MD  pantoprazole (PROTONIX) 40 MG tablet TAKE 1 TABLET BY MOUTH ONCE A DAY 04/07/20 04/07/21  Philemon KingdomProchnau, Caroline, MD  penicillin v potassium (VEETID) 500 MG tablet TAKE 2 TABLETS BY MOUTH NOW THEN TAKE 1 TABLET 4 TIMES DAILY UNTIL ALL TAKEN 11/22/20 11/22/21  Nicola Policeivils, John David, DMD    Allergies Keflex [cephalexin]  Family History  Problem Relation Age of Onset   Hypertension Mother    Hypertension Father    CAD Father    Heart disease Maternal Grandmother    Hypertension Maternal Grandmother    Stroke Maternal Grandmother    Cancer Maternal Grandmother        breast colon   Atrial fibrillation Maternal  Grandmother    Valvular heart disease Maternal Grandmother    Hypertension Maternal Grandfather    Diabetes Maternal Grandfather    Hypertension Paternal Grandmother    Cancer Paternal Grandmother        kidney   Hypertension Paternal Grandfather     Social History Social History   Tobacco Use   Smoking status: Former    Packs/day: 1.00    Years: 10.00    Pack years: 10.00    Types: Cigarettes    Quit date: 07/29/2014    Years since quitting: 6.8   Smokeless tobacco: Never  Vaping Use   Vaping Use: Never used  Substance Use Topics   Alcohol use: No   Drug use: No    Review of Systems  Review of Systems  Constitutional:  Negative for chills and fever.  HENT:  Negative for sore throat.   Eyes:  Negative for pain.  Respiratory:  Negative for cough and stridor.   Cardiovascular:  Negative for chest pain.  Gastrointestinal:  Negative for vomiting.  Genitourinary:  Negative for dysuria.  Musculoskeletal:  Positive for falls and joint pain (L knee, R knee).  Skin:  Negative for rash.   Neurological:  Negative for seizures, loss of consciousness and headaches.  Psychiatric/Behavioral:  Negative for suicidal ideas.   All other systems reviewed and are negative.    ____________________________________________   PHYSICAL EXAM:  VITAL SIGNS: ED Triage Vitals [05/27/21 2353]  Enc Vitals Group     BP (!) 145/91     Pulse Rate 87     Resp 20     Temp 98.9 F (37.2 C)     Temp Source Oral     SpO2 97 %     Weight 270 lb (122.5 kg)     Height  (1.676 m)     Head Circumference      Peak Flow      Pain Score      Pain Loc      Pain Edu?      Excl. in GC?    Vitals:   05/27/21 2353 05/28/21 0457  BP: (!) 145/91 (!) 157/101  Pulse: 87 76  Resp: 20 18  Temp: 98.9 F (37.2 C)   SpO2: 97% 100%   Physical Exam Vitals and nursing note reviewed.  Constitutional:      General: She is not in acute distress.    Appearance: She is well-developed. She is obese.  HENT:     Head: Normocephalic and atraumatic.     Right Ear: External ear normal.     Left Ear: External ear normal.     Nose: Nose normal.  Eyes:     Conjunctiva/sclera: Conjunctivae normal.  Cardiovascular:     Rate and Rhythm: Normal rate and regular rhythm.     Heart sounds: No murmur heard. Pulmonary:     Effort: Pulmonary effort is normal. No respiratory distress.  Abdominal:     Palpations: Abdomen is soft.     Tenderness: There is no abdominal tenderness.  Musculoskeletal:     Cervical back: Neck supple.  Skin:    General: Skin is warm and dry.  Neurological:     Mental Status: She is alert and oriented to person, place, and time.  Psychiatric:        Mood and Affect: Mood normal.    2+ DP pulses.  Sensation is intact to light touch at the bilateral extremities.  Patient is only minimally able to  extend and flex at the bilateral knees secondary to severe pain.  There is no significant tenderness effusion deformity or overlying tenderness over the bilateral ankles or hips.  There is  some tenderness over the bilateral patella and on the left side extending medially around the posterior aspect.  On the right there is some tenderness anteriorly over the patella without overlying skin changes as well as extending medially. ____________________________________________   LABS (all labs ordered are listed, but only abnormal results are displayed)  Labs Reviewed  RESP PANEL BY RT-PCR (FLU A&B, COVID) [id number]  CBC WITH DIFFERENTIAL/PLATELET  BASIC METABOLIC PANEL   ____________________________________________  EKG  ____________________________________________  RADIOLOGY  ED MD interpretation: Plain film of the the left knee shows no fracture dislocation.  Plain film of the right knee shows possible proximal tibia fracture concerning for possible avulsion fracture.  CT of the right knee shows acute fracture of the lateral tibial plateau and an avulsion fracture of the inferior medial patella.  There is also small to moderate lipohemarthrosis.   Official radiology report(s): CT Knee Right Wo Contrast  Result Date: 05/28/2021 CLINICAL DATA:  Right knee pain after fall. EXAM: CT OF THE RIGHT KNEE WITHOUT CONTRAST TECHNIQUE: Multidetector CT imaging of the right knee was performed according to the standard protocol. Multiplanar CT image reconstructions were also generated. COMPARISON:  Right knee x-rays from same day. FINDINGS: Bones/Joint/Cartilage Acute mildly depressed fracture of the posterior lateral tibial plateau. Acute avulsion fracture of the nonarticular lateral tibial plateau. Acute tiny avulsion fracture of the inferomedial patella. Lateral patellar subluxation. No dislocation. Joint spaces are preserved. Small to moderate lipohemarthrosis. Ligaments Ligaments are suboptimally evaluated by CT. Muscles and Tendons Grossly intact. Soft tissue No fluid collection or hematoma.  No soft tissue mass. IMPRESSION: 1. Acute fractures of the lateral tibial plateau as described above.  2. Acute tiny avulsion fracture of the inferomedial patella. 3. Small to moderate lipohemarthrosis. Electronically Signed   By: Obie Dredge M.D.   On: 05/28/2021 05:55   DG Knee Complete 4 Views Left  Result Date: 05/27/2021 CLINICAL DATA:  Left knee pain following fall, initial encounter EXAM: LEFT KNEE - COMPLETE 4+ VIEW COMPARISON:  None. FINDINGS: No evidence of fracture, dislocation, or joint effusion. No evidence of arthropathy or other focal bone abnormality. Soft tissues are unremarkable. IMPRESSION: No acute abnormality noted. Electronically Signed   By: Alcide Clever M.D.   On: 05/27/2021 19:47   DG Knee Complete 4 Views Right  Result Date: 05/28/2021 CLINICAL DATA:  Fall EXAM: RIGHT KNEE - COMPLETE 4+ VIEW COMPARISON:  None. FINDINGS: Abnormal appearance of the lateral aspect of the right proximal tibia. No joint effusion. No dislocation. IMPRESSION: Abnormal appearance of the lateral aspect of the right proximal tibia, concerning for avulsion fracture. CT of the right knee suggestive Electronically Signed   By: Deatra Robinson M.D.   On: 05/28/2021 00:46    ____________________________________________   PROCEDURES  Procedure(s) performed (including Critical Care):  Procedures   ____________________________________________   INITIAL IMPRESSION / ASSESSMENT AND PLAN / ED COURSE      Patient presents to the emergency room for assessment of right knee pain after gave out from under her when she was walking to her car after initially presenting for evaluation of left knee pain after she twisted her left knee when her dog caused her to fall earlier yesterday evening.  Patient states that since her subsequent fall in the parking lot leaving she has had very significant pain in  the right knee and been unable to bear any weight on the right lower extremity.  She states she had not been able to bear any weight in the left lower extremity and has been discharged with crutches and a knee  immobilizer.  States her pain is fairly severe at this time.  Denies any other acute pain or recent sick symptoms.  On exam she has some tenderness in his fairly weak on extension and flexion both knees but otherwise is neurovascular intact in lower extremities without other evidence of significant trauma.  Reviewed her plain film of the left knee obtained during patient's initial evaluation assessment that showed no fracture dislocation.  Plain film of the right knee obtained in triage shows possible right proximal tibia avulsion fracture with recommendation by radiology to obtain a CT.  CT ordered.  Patient given additional dose of analgesia.  CT of the right knee shows acute fracture of the lateral tibial plateau and an avulsion fracture of the inferior medial patella.  There is also small to moderate lipohemarthrosis.   Patient given additional dose of analgesia on presentation consisting of Percocet.  However she is still endorsing fairly significant pain refusing to attempt to bear weight on reassessment.  Discussed with orthopedist Dr. Odis Luster who noted this was not require urgent surgical repair with the patient if needed could be admitted to medicine for PT OT and pain control.  Given she is endorsing fairly significant pain after Percocet and Norco and Toradol last night will admit to medicine service for pain control as well as PT and OT.   ____________________________________________   FINAL CLINICAL IMPRESSION(S) / ED DIAGNOSES  Final diagnoses:  Closed fracture of proximal end of right tibia, unspecified fracture morphology, initial encounter  Acute pain of both knees    Medications  oxyCODONE-acetaminophen (PERCOCET/ROXICET) 5-325 MG per tablet 1 tablet (1 tablet Oral Given 05/28/21 0547)     ED Discharge Orders     None        Note:  This document was prepared using Dragon voice recognition software and may include unintentional dictation errors.    Gilles Chiquito,  MD 05/28/21 [phone]

## 2021-05-28 NOTE — H&P (Signed)
History and Physical    Taylor [name] KNL:[id number] DOB: 1986-09-21 DOA: 05/28/2021  PCP: Philemon Kingdom, MD Patient coming from: Home  I have personally briefly reviewed patient's old medical records in [facility]  Chief Complaint: Knee pain  HPI: Taylor [name] is a 35 y.o. female with medical history significant of anxiety, hypertension, depression, GERD, hypothyroid who presents to the ED after initially being evaluated for pain in the left knee on 8/14.  No fracture was noted at that time and as the patient was discharged in the ED she was referred to orthopedic surgery and given follow-up instructions.  She states that when she was getting into her car she twisted her right knee and injured that as well.  Patient now had pain in the anterior aspect of right knee.  Presented back to the emergency room and imaging of the right knee revealed tibial plateau fracture and right patellar fracture.  Orthopedics was consulted and recommended nonoperative management.   ED Course: On presentation imaging survey as above.  Orthopedics consulted.  Nonoperative management.  Pain control was provided.  Therapy evaluations requested.  Hospitalist contacted for admission.  Review of Systems: As per HPI otherwise 14 point review of systems negative.    Past Medical History:  Diagnosis Date   Anxiety    Complication of anesthesia    Hard to put under anesthesia, combative when waking up, heart palpitations under anesthesia   Depression    Essential hypertension 08/08/2016   GERD (gastroesophageal reflux disease)    H/O back injury    broken back age 58   Hypertension    with pregnancy   Hypothyroidism     Past Surgical History:  Procedure Laterality Date   CESAREAN SECTION N/A 11/22/2014   Procedure: CESAREAN SECTION;  Surgeon: Turner Daniels, MD;  Location: [facility];  Service: Obstetrics;  Laterality: N/A;   CHOLECYSTECTOMY N/A 05/29/2016   Procedure: LAPAROSCOPIC  CHOLECYSTECTOMY;  Surgeon: Tiney Rouge III, MD;  Location: [facility];  Service: General;  Laterality: N/A;   LAPAROSCOPIC GASTRIC SLEEVE RESECTION N/A 09/16/2017   Procedure: LAPAROSCOPIC GASTRIC SLEEVE RESECTION, UPPER ENDO;  Surgeon: Luretha Murphy, MD;  Location: [facility];  Service: General;  Laterality: N/A;   LAPAROSCOPIC OVARIAN CYSTECTOMY Right    LAPAROSCOPY Left 08/06/2016   Procedure: LAPAROSCOPY DIAGNOSTIC with drainage of left ovarian cyst;  Surgeon: Marcelle Overlie, MD;  Location: [facility];  Service: Gynecology;  Laterality: Left;   WISDOM TOOTH EXTRACTION       reports that she quit smoking about 6 years ago. Her smoking use included cigarettes. She has a 10.00 pack-year smoking history. She has never used smokeless tobacco. She reports that she does not drink alcohol and does not use drugs.  Allergies  Allergen Reactions   Keflex [Cephalexin] Anaphylaxis    Family History  Problem Relation Age of Onset   Hypertension Mother    Hypertension Father    CAD Father    Heart disease Maternal Grandmother    Hypertension Maternal Grandmother    Stroke Maternal Grandmother    Cancer Maternal Grandmother        breast colon   Atrial fibrillation Maternal Grandmother    Valvular heart disease Maternal Grandmother    Hypertension Maternal Grandfather    Diabetes Maternal Grandfather    Hypertension Paternal Grandmother    Cancer Paternal Grandmother        kidney   Hypertension Paternal Grandfather      Prior to Admission medications  Medication Sig Start Date End Date Taking? Authorizing Provider  ALPRAZolam (XANAX) 0.25 MG tablet Take 0.25 mg by mouth 2 (two) times daily as needed for anxiety.    [provider]  aspirin-acetaminophen-caffeine (EXCEDRIN MIGRAINE) [id number] MG tablet Take 2 tablets by mouth daily as needed for headache.    [provider]  calcium carbonate (OS-CAL) 1250 (500 Ca) MG chewable tablet Chew 2 tablets by mouth daily.    [provider]  ergocalciferol (VITAMIN D2) 1.25 MG (50000 UT) capsule take 1 capsule by mouth Once a week 02/08/21     famotidine (PEPCID) 20 MG tablet TAKE 1 TABLET BY MOUTH FOR BREAKTHROUGH HEARTBURN TWICE A DAILY AS NEEDED 11/16/20 11/16/21  Philemon KingdomProchnau, Caroline, MD  FLUoxetine (PROZAC) 40 MG capsule Take 40 mg by mouth daily.    [provider]  FLUoxetine (PROZAC) 40 MG capsule TAKE 1 CAPSULE BY MOUTH ONCE DAILY IN THE MORNING 04/07/20 04/07/21  Philemon KingdomProchnau, Caroline, MD  hydrochlorothiazide (HYDRODIURIL) 25 MG tablet TAKE 1 TABLET BY MOUTH ONCE A DAY 04/07/20 04/07/21  Philemon KingdomProchnau, Caroline, MD  hydrochlorothiazide (MICROZIDE) 12.5 MG capsule Take 12.5 mg by mouth daily.    [provider]  HYDROcodone-acetaminophen (NORCO) 5-325 MG tablet Take 1 tablet by mouth every 6 (six) hours as needed for up to 3 days. 05/27/21 05/30/21  Orvil FeilWoods, Jaclyn M, PA-C  levonorgestrel (MIRENA) 20 MCG/24HR IUD 1 each by Intrauterine route once.    [provider]  levothyroxine (SYNTHROID) 112 MCG tablet TAKE 1 TABLET BY MOUTH ONCE A DAY 05/11/20 05/11/21  Philemon KingdomProchnau, Caroline, MD  levothyroxine (SYNTHROID, LEVOTHROID) 125 MCG tablet Take 125 mcg by mouth daily before breakfast.    [provider]  Liraglutide -Weight Management 18 MG/3ML SOPN INJECT 3 MG INTO THE SKIN ONCE A DAY 11/16/20 11/16/21  Philemon KingdomProchnau, Caroline, MD  Molnupiravir 200 MG CAPS TAKE 4 CAPSULES BY MOUTH EVERY 12 HOURS FOR 5 DAYS 12/12/20 12/12/21  Philemon KingdomProchnau, Caroline, MD  Multiple Vitamins-Minerals (BARIATRIC FUSION) CHEW Chew 1 tablet by mouth 3 (three) times daily.    [provider]  ondansetron (ZOFRAN ODT) 4 MG disintegrating tablet Take 1 tablet (4 mg total) by mouth every 8 (eight) hours as needed for up to 3 days. 05/27/21 05/30/21  Orvil FeilWoods, Jaclyn M, PA-C  OxyCODONE HCl 10 MG/0.5ML CONC Take 5-10 mLs by mouth every 4 (four) hours as needed. Take  5 to 10 ml every 4 hours as needed.    [provider]  pantoprazole  (PROTONIX) 40 MG tablet Take 40 mg by mouth daily. 09/10/17   [provider]  pantoprazole (PROTONIX) 40 MG tablet TAKE 1 TABLET BY MOUTH ONCE DAILY 11/16/20 11/16/21  Philemon KingdomProchnau, Caroline, MD  pantoprazole (PROTONIX) 40 MG tablet TAKE 1 TABLET BY MOUTH ONCE A DAY 04/07/20 04/07/21  Philemon KingdomProchnau, Caroline, MD  penicillin v potassium (VEETID) 500 MG tablet TAKE 2 TABLETS BY MOUTH NOW THEN TAKE 1 TABLET 4 TIMES DAILY UNTIL ALL TAKEN 11/22/20 11/22/21  Nicola Policeivils, John David, DMD    Physical Exam: Vitals:   05/27/21 2353 05/28/21 0457  BP: (!) 145/91 (!) 157/101  Pulse: 87 76  Resp: 20 18  Temp: 98.9 F (37.2 C)   TempSrc: Oral   SpO2: 97% 100%  Weight: 122.5 kg   Height: 5\' 6"  (1.676 m)      Vitals:   05/27/21 2353 05/28/21 0457  BP: (!) 145/91 (!) 157/101  Pulse: 87 76  Resp: 20 18  Temp: 98.9 F (37.2 C)  TempSrc: Oral   SpO2: 97% 100%  Weight: 122.5 kg   Height: 5\' 6"  (1.676 m)    Constitutional: Uncomfortable due to pain ENMT: Moist mucous membranes.  Normal dentition Respiratory: Lungs clear.  Normal work of breathing.  Room air Cardiovascular: S1-S2, regular rate and rhythm, no murmurs Abdomen: Obese, nontender, nondistended, normal bowel sounds Musculoskeletal: Decreased range of motion bilateral knees.  Pain on palpation.  Normal muscle tone Skin: No rashes or lesions.  No induration Neurologic: Cranial nerves grossly intact, sensation intact, strength decreased bilateral lower extremities Psychiatric: Normal judgment and insight. Alert and oriented x 3. Normal mood.    Labs on Admission: I have personally reviewed following labs and imaging studies  CBC: Recent Labs  Lab 05/28/21 0627  WBC 9.4  NEUTROABS 6.8  HGB 11.4*  HCT 35.6*  MCV 83.6  PLT 330   Basic Metabolic Panel: Recent Labs  Lab 05/28/21 0627  NA 137  K 3.7  CL 103  CO2 27  GLUCOSE 111*  BUN 12  CREATININE 0.65  CALCIUM 8.3*   GFR: Estimated Creatinine Clearance: 131.1 mL/min (by C-G  formula based on SCr of 0.65 mg/dL). Liver Function Tests: No results for input(s): AST, ALT, ALKPHOS, BILITOT, PROT, ALBUMIN in the last 168 hours. No results for input(s): LIPASE, AMYLASE in the last 168 hours. No results for input(s): AMMONIA in the last 168 hours. Coagulation Profile: No results for input(s): INR, PROTIME in the last 168 hours. Cardiac Enzymes: No results for input(s): CKTOTAL, CKMB, CKMBINDEX, TROPONINI in the last 168 hours. BNP (last 3 results) No results for input(s): PROBNP in the last 8760 hours. HbA1C: No results for input(s): HGBA1C in the last 72 hours. CBG: No results for input(s): GLUCAP in the last 168 hours. Lipid Profile: No results for input(s): CHOL, HDL, LDLCALC, TRIG, CHOLHDL, LDLDIRECT in the last 72 hours. Thyroid Function Tests: No results for input(s): TSH, T4TOTAL, FREET4, T3FREE, THYROIDAB in the last 72 hours. Anemia Panel: No results for input(s): VITAMINB12, FOLATE, FERRITIN, TIBC, IRON, RETICCTPCT in the last 72 hours. Urine analysis:    Component Value Date/Time   COLORURINE YELLOW (A) 05/28/2016 0744   APPEARANCEUR CLOUDY (A) 05/28/2016 0744   APPEARANCEUR Hazy 08/25/2014 1408   LABSPEC 1.026 05/28/2016 0744   LABSPEC 1.011 08/25/2014 1408   PHURINE 5.0 05/28/2016 0744   GLUCOSEU NEGATIVE 05/28/2016 0744   GLUCOSEU Negative 08/25/2014 1408   HGBUR 2+ (A) 05/28/2016 0744   BILIRUBINUR NEGATIVE 05/28/2016 0744   BILIRUBINUR Negative 08/25/2014 1408   KETONESUR NEGATIVE 05/28/2016 0744   PROTEINUR NEGATIVE 05/28/2016 0744   NITRITE POSITIVE (A) 05/28/2016 0744   LEUKOCYTESUR TRACE (A) 05/28/2016 0744   LEUKOCYTESUR Negative 08/25/2014 1408    Radiological Exams on Admission: CT Knee Right Wo Contrast  Result Date: 05/28/2021 CLINICAL DATA:  Right knee pain after fall. EXAM: CT OF THE RIGHT KNEE WITHOUT CONTRAST TECHNIQUE: Multidetector CT imaging of the right knee was performed according to the standard protocol. Multiplanar  CT image reconstructions were also generated. COMPARISON:  Right knee x-rays from same day. FINDINGS: Bones/Joint/Cartilage Acute mildly depressed fracture of the posterior lateral tibial plateau. Acute avulsion fracture of the nonarticular lateral tibial plateau. Acute tiny avulsion fracture of the inferomedial patella. Lateral patellar subluxation. No dislocation. Joint spaces are preserved. Small to moderate lipohemarthrosis. Ligaments Ligaments are suboptimally evaluated by CT. Muscles and Tendons Grossly intact. Soft tissue No fluid collection or hematoma.  No soft tissue mass. IMPRESSION: 1. Acute fractures of the lateral tibial  plateau as described above. 2. Acute tiny avulsion fracture of the inferomedial patella. 3. Small to moderate lipohemarthrosis. Electronically Signed   By: Obie Dredge M.D.   On: 05/28/2021 05:55   DG Knee Complete 4 Views Left  Result Date: 05/27/2021 CLINICAL DATA:  Left knee pain following fall, initial encounter EXAM: LEFT KNEE - COMPLETE 4+ VIEW COMPARISON:  None. FINDINGS: No evidence of fracture, dislocation, or joint effusion. No evidence of arthropathy or other focal bone abnormality. Soft tissues are unremarkable. IMPRESSION: No acute abnormality noted. Electronically Signed   By: Alcide Clever M.D.   On: 05/27/2021 19:47   DG Knee Complete 4 Views Right  Result Date: 05/28/2021 CLINICAL DATA:  Fall EXAM: RIGHT KNEE - COMPLETE 4+ VIEW COMPARISON:  None. FINDINGS: Abnormal appearance of the lateral aspect of the right proximal tibia. No joint effusion. No dislocation. IMPRESSION: Abnormal appearance of the lateral aspect of the right proximal tibia, concerning for avulsion fracture. CT of the right knee suggestive Electronically Signed   By: Deatra Robinson M.D.   On: 05/28/2021 00:46    EKG: Not performed in ED  Assessment/Plan Active Problems:   Right medial tibial plateau fracture  Right medial tibial plateau fracture Right patellar fracture Left knee  pain Patient initially presented after left knee injury On discharge from ED twisted right knee and presented back Right knee with radiographic evidence of fracture Orthopedics consulted with nonoperative management recommended Plan: Placed in observation Multimodal pain control MRI left knee to determine weightbearing status Therapy evaluations once results of MRI are known  Essential hypertension PTA hydrochlorothiazide As needed hydralazine  Hypothyroidism PTA Synthroid  Depression/anxiety PTA Prozac PTA Xanax  GERD PPI  Obesity BMI 43.58 This complicates overall care and prognosis     DVT prophylaxis: SQ Lovenox Code Status: Full Family Communication: Significant other at bedside Disposition Plan: Anticipate return to previous home environment Consults called: Orthopedics Admission status: Observation, MedSurg   Tresa Moore MD [facility]  If 7PM-7AM, please contact night-coverage   05/28/2021, 7:26 AM

## 2021-05-28 NOTE — Consult Note (Signed)
ORTHOPAEDIC CONSULTATION  REQUESTING PHYSICIAN: Tresa Moore, MD  Chief Complaint: bilateral knee pain  HPI: Taylor [name] is a 35 y.o. female who complains of bilateral knee pain after two separate falls. The pain is sharp in character. The pain is severe and 8/10. The pain is worse with movement and better with rest. Denies any numbness, tingling or constitutional symptoms.  Past Medical History:  Diagnosis Date   Anxiety    Complication of anesthesia    Hard to put under anesthesia, combative when waking up, heart palpitations under anesthesia   Depression    Essential hypertension 08/08/2016   GERD (gastroesophageal reflux disease)    H/O back injury    broken back age 66   Hypertension    with pregnancy   Hypothyroidism    Past Surgical History:  Procedure Laterality Date   CESAREAN SECTION N/A 11/22/2014   Procedure: CESAREAN SECTION;  Surgeon: Turner Daniels, MD;  Location: [facility];  Service: Obstetrics;  Laterality: N/A;   CHOLECYSTECTOMY N/A 05/29/2016   Procedure: LAPAROSCOPIC CHOLECYSTECTOMY;  Surgeon: Tiney Rouge III, MD;  Location: [facility];  Service: General;  Laterality: N/A;   LAPAROSCOPIC GASTRIC SLEEVE RESECTION N/A 09/16/2017   Procedure: LAPAROSCOPIC GASTRIC SLEEVE RESECTION, UPPER ENDO;  Surgeon: Luretha Murphy, MD;  Location: [facility];  Service: General;  Laterality: N/A;   LAPAROSCOPIC OVARIAN CYSTECTOMY Right    LAPAROSCOPY Left 08/06/2016   Procedure: LAPAROSCOPY DIAGNOSTIC with drainage of left ovarian cyst;  Surgeon: Marcelle Overlie, MD;  Location: [facility];  Service: Gynecology;  Laterality: Left;   WISDOM TOOTH EXTRACTION     Social History   Socioeconomic History   Marital status: Divorced    Spouse name: Not on file   Number of children: Not on file   Years of education: Not on file   Highest education level: Not on file  Occupational History   Not on file  Tobacco Use   Smoking status: Former    Packs/day: 1.00    Years: 10.00    Pack  years: 10.00    Types: Cigarettes    Quit date: 07/29/2014    Years since quitting: 6.8   Smokeless tobacco: Never  Vaping Use   Vaping Use: Never used  Substance and Sexual Activity   Alcohol use: No   Drug use: No   Sexual activity: Yes    Birth control/protection: I.U.D.  Other Topics Concern   Not on file  Social History Narrative   Not on file   Social Determinants of Health   Financial Resource Strain: Not on file  Food Insecurity: Not on file  Transportation Needs: Not on file  Physical Activity: Not on file  Stress: Not on file  Social Connections: Not on file   Family History  Problem Relation Age of Onset   Hypertension Mother    Hypertension Father    CAD Father    Heart disease Maternal Grandmother    Hypertension Maternal Grandmother    Stroke Maternal Grandmother    Cancer Maternal Grandmother        breast colon   Atrial fibrillation Maternal Grandmother    Valvular heart disease Maternal Grandmother    Hypertension Maternal Grandfather    Diabetes Maternal Grandfather    Hypertension Paternal Grandmother    Cancer Paternal Grandmother        kidney   Hypertension Paternal Grandfather    Allergies  Allergen Reactions   Keflex [Cephalexin] Anaphylaxis   Prior to Admission medications  Medication Sig Start Date End Date Taking? Authorizing Provider  ALPRAZolam (XANAX) 0.25 MG tablet Take 0.25 mg by mouth 2 (two) times daily as needed for anxiety.   Yes [provider]  calcium carbonate (OS-CAL) 1250 (500 Ca) MG chewable tablet Chew 2 tablets by mouth daily.   Yes [provider]  ergocalciferol (VITAMIN D2) 1.25 MG (50000 UT) capsule take 1 capsule by mouth Once a week 02/08/21  Yes   FLUoxetine (PROZAC) 40 MG capsule Take 40 mg by mouth daily.   Yes [provider]  hydrochlorothiazide (MICROZIDE) 12.5 MG capsule Take 12.5 mg by mouth daily.   Yes [provider]  levothyroxine (SYNTHROID, LEVOTHROID) 125 MCG  tablet Take 125 mcg by mouth daily before breakfast.   Yes [provider]  Liraglutide -Weight Management 18 MG/3ML SOPN INJECT 3 MG INTO THE SKIN ONCE A DAY 11/16/20 11/16/21 Yes Prochnau, Rayfield Citizen, MD  Multiple Vitamins-Minerals (BARIATRIC FUSION) CHEW Chew 1 tablet by mouth 3 (three) times daily.   Yes [provider]  pantoprazole (PROTONIX) 40 MG tablet TAKE 1 TABLET BY MOUTH ONCE DAILY 11/16/20 11/16/21 Yes Prochnau, Rayfield Citizen, MD  aspirin-acetaminophen-caffeine (EXCEDRIN MIGRAINE) [phone] MG tablet Take 2 tablets by mouth daily as needed for headache.    [provider]  famotidine (PEPCID) 20 MG tablet TAKE 1 TABLET BY MOUTH FOR BREAKTHROUGH HEARTBURN TWICE A DAILY AS NEEDED 11/16/20 11/16/21  Philemon Kingdom, MD  HYDROcodone-acetaminophen (NORCO) 5-325 MG tablet Take 1 tablet by mouth every 6 (six) hours as needed for up to 3 days. 05/27/21 05/30/21  Orvil Feil, PA-C  levonorgestrel (MIRENA) 20 MCG/24HR IUD 1 each by Intrauterine route once.    [provider]  Molnupiravir 200 MG CAPS TAKE 4 CAPSULES BY MOUTH EVERY 12 HOURS FOR 5 DAYS 12/12/20 12/12/21  Philemon Kingdom, MD  ondansetron (ZOFRAN ODT) 4 MG disintegrating tablet Take 1 tablet (4 mg total) by mouth every 8 (eight) hours as needed for up to 3 days. 05/27/21 05/30/21  Orvil Feil, PA-C   CT Knee Right Wo Contrast  Result Date: 05/28/2021 CLINICAL DATA:  Right knee pain after fall. EXAM: CT OF THE RIGHT KNEE WITHOUT CONTRAST TECHNIQUE: Multidetector CT imaging of the right knee was performed according to the standard protocol. Multiplanar CT image reconstructions were also generated. COMPARISON:  Right knee x-rays from same day. FINDINGS: Bones/Joint/Cartilage Acute mildly depressed fracture of the posterior lateral tibial plateau. Acute avulsion fracture of the nonarticular lateral tibial plateau. Acute tiny avulsion fracture of the inferomedial patella. Lateral patellar subluxation. No dislocation.  Joint spaces are preserved. Small to moderate lipohemarthrosis. Ligaments Ligaments are suboptimally evaluated by CT. Muscles and Tendons Grossly intact. Soft tissue No fluid collection or hematoma.  No soft tissue mass. IMPRESSION: 1. Acute fractures of the lateral tibial plateau as described above. 2. Acute tiny avulsion fracture of the inferomedial patella. 3. Small to moderate lipohemarthrosis. Electronically Signed   By: Obie Dredge M.D.   On: 05/28/2021 05:55   DG Knee Complete 4 Views Left  Result Date: 05/27/2021 CLINICAL DATA:  Left knee pain following fall, initial encounter EXAM: LEFT KNEE - COMPLETE 4+ VIEW COMPARISON:  None. FINDINGS: No evidence of fracture, dislocation, or joint effusion. No evidence of arthropathy or other focal bone abnormality. Soft tissues are unremarkable. IMPRESSION: No acute abnormality noted. Electronically Signed   By: Alcide Clever M.D.   On: 05/27/2021 19:47   DG Knee Complete 4 Views Right  Result Date: 05/28/2021 CLINICAL DATA:  Fall EXAM: RIGHT KNEE - COMPLETE 4+ VIEW COMPARISON:  None. FINDINGS: Abnormal appearance of the lateral aspect of the right proximal tibia. No joint effusion. No dislocation. IMPRESSION: Abnormal appearance of the lateral aspect of the right proximal tibia, concerning for avulsion fracture. CT of the right knee suggestive Electronically Signed   By: Deatra Robinson M.D.   On: 05/28/2021 00:46    Positive ROS: All other systems have been reviewed and were otherwise negative with the exception of those mentioned in the HPI and as above.  Physical Exam: General: Alert, no acute distress Cardiovascular: No pedal edema Respiratory: No cyanosis, no use of accessory musculature GI: No organomegaly, abdomen is soft and non-tender Skin: No lesions in the area of chief complaint Neurologic: Sensation intact distally Psychiatric: Patient is competent for consent with normal mood and affect Lymphatic: No axillary or cervical  lymphadenopathy  MUSCULOSKELETAL: Right knee with +effusion, tender medial and lateral, ligaments stable to v/v. Compartments soft. Good cap refill. Motor and sensory intact distally. Left knee with medial and lateral tenderness, trace effusion, unable to perform Lachman secondary to pain  Assessment: Right knee minimally displaced tibial plateau fracture Left knee injury  Plan: The right knee may be treated non-operatively with bracing and close radiologic follow-up and non-weight bearing for 6 weeks. Will order an MRI of her left knee to determine weight bearing status and potential for future surgery based on the findings. Please hold on PT/OT until MRI completed. Ok to eat.    Lyndle Herrlich, MD    05/28/2021 12:08 PM

## 2021-05-28 NOTE — ED Notes (Addendum)
Pt was getting into POV after DC from [facility] for L knee pain, pt states her R knee buckled and she put herself down on the ground. When staff arrived to the vehicle, pt laying on her back with her feet up on the floor of the front seat. Pt states she put her self on the ground and c/o R knee popped and unable to bear any weight. Pt assisted to ED lobby via EMS stretcher and placed in a recliner. Pt already has knee immobilizer on L knee.

## 2021-05-28 NOTE — ED Triage Notes (Signed)
Pt states was getting discharged from emergency department and getting into car when she felt her right knee give out and she fell landing on right knee.

## 2021-05-29 ENCOUNTER — Encounter: Payer: Self-pay | Admitting: Internal Medicine

## 2021-05-29 DIAGNOSIS — S83512D Sprain of anterior cruciate ligament of left knee, subsequent encounter: Secondary | ICD-10-CM

## 2021-05-29 DIAGNOSIS — S82044D Nondisplaced comminuted fracture of right patella, subsequent encounter for closed fracture with routine healing: Secondary | ICD-10-CM | POA: Diagnosis not present

## 2021-05-29 LAB — CBC
HCT: 33.1 % — ABNORMAL LOW (ref 36.0–46.0)
Hemoglobin: 10.5 g/dL — ABNORMAL LOW (ref 12.0–15.0)
MCH: 26.9 pg (ref 26.0–34.0)
MCHC: 31.7 g/dL (ref 30.0–36.0)
MCV: 84.9 fL (ref 80.0–100.0)
Platelets: 282 10*3/uL (ref 150–400)
RBC: 3.9 MIL/uL (ref 3.87–5.11)
RDW: 14.5 % (ref 11.5–15.5)
WBC: 6.6 10*3/uL (ref 4.0–10.5)
nRBC: 0 % (ref 0.0–0.2)

## 2021-05-29 LAB — BASIC METABOLIC PANEL
Anion gap: 4 — ABNORMAL LOW (ref 5–15)
BUN: 14 mg/dL (ref 6–20)
CO2: 27 mmol/L (ref 22–32)
Calcium: 8.1 mg/dL — ABNORMAL LOW (ref 8.9–10.3)
Chloride: 106 mmol/L (ref 98–111)
Creatinine, Ser: 0.74 mg/dL (ref 0.44–1.00)
GFR, Estimated: >60 mL/min (ref >60)
Glucose, Bld: 96 mg/dL (ref 70–99)
Potassium: 3.7 mmol/L (ref 3.5–5.1)
Sodium: 137 mmol/L (ref 135–145)

## 2021-05-29 LAB — PROTIME-INR
INR: 1 (ref 0.8–1.2)
Prothrombin Time: 13.2 seconds (ref 11.4–15.2)

## 2021-05-29 LAB — HIV ANTIBODY (ROUTINE TESTING W REFLEX): HIV Screen 4th Generation wRfx: NONREACTIVE

## 2021-05-29 MED ORDER — METHOCARBAMOL 500 MG PO TABS
750.0000 mg | ORAL_TABLET | Freq: Three times a day (TID) | ORAL | Status: DC | PRN
  Administered 2021-05-29 – 2021-06-02 (×10): 750 mg via ORAL
  Filled 2021-05-29 (×10): qty 2

## 2021-05-29 NOTE — Evaluation (Signed)
Occupational Therapy Evaluation Patient Details Name: Taylor [name] MRN: [id number] DOB: [birth year] Today's Date: 05/29/2021    History of Present Illness 35 y.o. female with medical history significant of anxiety, hypertension, depression, GERD, hypothyroid who presents to the ED after initially being evaluated for pain in the left knee on 8/14.  No fracture was noted at that time and as the patient was discharged. She states that when she was getting into her car she twisted her right knee and injured that as well. 05/28/21 CT R Knee:  Acute fractures of the lateral tibial plateau,  Acute tiny avulsion fracture of the inferomedial patella. 05/28/21 MRI L Knee: Complete proximal ACL tear, Acute fractures of the lateral tibial plateau, Tiny subchondral fracture of the lateral femoral condyle.   Clinical Impression   Taylor [name] was seen for OT/PT co-evaluation this date. Prior to hospital admission, pt was Independent for mobility and I/ADLs including working full time. Pt lives with husband and 2 children, plans to d/c to her parents house which only has 2 STE. Pt presents to acute OT demonstrating impaired ADL performance and functional mobility 2/2 decreased activity tolerance, pain, and functional ROM/balance deficits.   Pt currently requires MAX A don B socks at bed level. SETUP seated grooming and UBD. MIN A x2 + RW standing from elevated bed height c BLE locked in extension improving to CGA + RW for pivot bed>chair. SUPERVISION lateral scoot chair>bed - noted to maintain NWBing pcns t/o for lateral scoot only. Pt instructed on home/routines modifications and adapted bathing/dressing strategies.   Pt would benefit from skilled OT to address noted impairments and functional limitations (see below for any additional details) in order to maximize safety and independence while minimizing falls risk and caregiver burden. Upon hospital discharge, recommend HHOT to maximize pt safety and return to  functional independence during meaningful occupations of daily life.     Follow Up Recommendations  Home health OT    Equipment Recommendations  3 in 1 bedside commode;Other (comment);Wheelchair (measurements OT) (drop arm bariatric BSC)    Recommendations for Other Services       Precautions / Restrictions Precautions Precautions: Fall Required Braces or Orthoses: Knee Immobilizer - Right;Other Brace;Knee Immobilizer - Left (L knee hinged brace) Knee Immobilizer - Right: On at all times;Other (comment) (remove for hygiene) Knee Immobilizer - Left: On at all times (remove for hygiene) Restrictions Weight Bearing Restrictions: Yes RLE Weight Bearing: Non weight bearing LLE Weight Bearing: Weight bearing as tolerated (in locked brace)      Mobility Bed Mobility Overal bed mobility: Independent                  Transfers Overall transfer level: Needs assistance Equipment used: Rolling walker (2 wheeled) Transfers: Sit to/from [employer];Lateral/Scoot Transfers Sit to Stand: Min assist;+2 physical assistance;From elevated surface Stand pivot transfers: Min guard      Lateral/Scoot Transfers: Supervision General transfer comment: MIN A x2 + RW standing from elevated bed height c BLE locked in extension improving to CGA + RW for pivot bed>chair. SUPERVISION lateral scoot chair>bed - noted to maintain NWBing pcns t/o for lateral scoot only    Balance Overall balance assessment: Needs assistance Sitting-balance support: Feet supported;Single extremity supported Sitting balance-Leahy Scale: Good     Standing balance support: Bilateral upper extremity supported Standing balance-Leahy Scale: Fair  ADL either performed or assessed with clinical judgement   ADL Overall ADL's : Needs assistance/impaired                                       General ADL Comments: MAX A don B socks at bed level.  SETUP seated grooming ADLs.      Pertinent Vitals/Pain Pain Assessment: 0-10 Pain Score: 7  Pain Location: BLE Pain Descriptors / Indicators: Grimacing;Discomfort Pain Intervention(s): Limited activity within patient's tolerance;Repositioned     Hand Dominance Right   Extremity/Trunk Assessment Upper Extremity Assessment Upper Extremity Assessment: Overall WFL for tasks assessed   Lower Extremity Assessment Lower Extremity Assessment: RLE deficits/detail;LLE deficits/detail RLE: Unable to fully assess due to immobilization LLE: Unable to fully assess due to immobilization       Communication Communication Communication: No difficulties   Cognition Arousal/Alertness: Awake/alert Behavior During Therapy: WFL for tasks assessed/performed Overall Cognitive Status: Within Functional Limits for tasks assessed                                     General Comments       Exercises Exercises: Other exercises Other Exercises Other Exercises: Pt educated re: OT role, DME recs, d/c recs, falls prevention, ECS, home/routines modifications Other Exercises: LBD, sup<>sit, sit<>stand, SPT, lateral scoot t/f, UBD, AE   Shoulder Instructions      Home Living Family/patient expects to be discharged to:: Private residence Living Arrangements: Spouse/significant other;Parent;Children Available Help at Discharge: Family;Available PRN/intermittently Type of Home: House Home Access: Stairs to enter [employer] of Steps: 2   Home Layout: One level     Bathroom Shower/Tub: Tub/shower unit         Home Equipment:  (pt reports may have a walker from a relative)          Prior Functioning/Environment Level of Independence: Independent        Comments: 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 Cares for her 6yo child and 12yo stepchild        OT Problem List: Decreased range of motion;Decreased activity tolerance;Impaired balance (sitting and/or  standing);Decreased knowledge of use of DME or AE      OT Treatment/Interventions: Self-care/ADL training;Therapeutic exercise;Energy conservation;DME and/or AE instruction;Therapeutic activities;Patient/family education;Balance training    OT Goals(Current goals can be found in the care plan section) Acute Rehab OT Goals Patient Stated Goal: to go home tomorrow OT Goal Formulation: With patient Time For Goal Achievement: 06/12/21 Potential to Achieve Goals: Good ADL Goals Pt Will Perform Lower Body Dressing: with min assist;with caregiver independent in assisting;sitting/lateral leans Pt Will Transfer to Toilet: with modified independence;with transfer board;bedside commode Pt Will Perform Toileting - Clothing Manipulation and hygiene: with modified independence;sitting/lateral leans  OT Frequency: Min 2X/week           Co-evaluation PT/OT/SLP Co-Evaluation/Treatment: Yes Reason for Co-Treatment: Complexity of the patient's impairments (multi-system involvement);To address functional/ADL transfers PT goals addressed during session: Mobility/safety with mobility OT goals addressed during session: ADL's and self-care      AM-PAC OT "6 Clicks" Daily Activity     Outcome Measure Help from another person eating meals?: None Help from another person taking care of personal grooming?: A Little Help from another person toileting, which includes using toliet, bedpan, or urinal?: A Little Help from another person bathing (including washing,  rinsing, drying)?: A Little Help from another person to put on and taking off regular upper body clothing?: A Little Help from another person to put on and taking off regular lower body clothing?: A Lot 6 Click Score: 18   End of Session Equipment Utilized During Treatment: Rolling walker  Activity Tolerance: Patient tolerated treatment well Patient left: in bed;with call bell/phone within reach;with bed alarm set  OT Visit Diagnosis: Other  abnormalities of gait and mobility (R26.89)                Time: 9935-7017 OT Time Calculation (min): 45 min Charges:  OT General Charges $OT Visit: 1 Visit OT Evaluation $OT Eval Low Complexity: 1 Low OT Treatments $Self Care/Home Management : 8-22 mins  Kathie Dike, M.S. OTR/L  05/29/21, 2:08 PM  ascom [phone]

## 2021-05-29 NOTE — Evaluation (Addendum)
Physical Therapy Evaluation Patient Details Name: Taylor [name] MRN: [id number] DOB: 07-09-1986 Today's Date: 05/29/2021   History of Present Illness  Patient is a 35 year old female with bilateral knee injuries. Left knee evaluated initially with knee immobilizer recommeneded and discharge home from ED. While getting into her car, she twisted her right knee and came back for evaluation. Found to have Left knee with acute fractures of the lateral tibial plateau, tiny subchondral fracture of the lateral femoral condyle, complete proximal ACL tear, extracapsular edema. Right knee minimally displaced tibial plateau fracture to be treated  non-operatively with bracing and NWB for 6 weeks.   Clinical Impression  Patient agreeable to PT. Patient has right knee immobilizer in place and left knee ROM hinged knee brace locked in extension.  Patient performed several transfers during session. Sit to stand and stand pivot transfer performed from bed to recliner chair with bed height significantly elevated to accommodate for bilateral knee extension immobilization.  Patient needed +2 person assistance and had difficulty maintaining NWB of RLE during pivot transfer.  She then performed a lateral scoot transfer from recliner chair to the bed (with recliner arm in dropped position) with no physical assistance from therapist. She was able to maintain NWB of RLE without difficulty. Recommend to perform lateral scooting transfer  with routine out of bed mobility for safety.   With her knee ROM and weight bearing restrictions limitations, recommend EMS transport home if possible as the safest plan. Patient wants to go home with the assistance of her family. Recommend HHPT and the DME listed below.     Follow Up Recommendations Home health PT;Supervision for mobility/OOB    Equipment Recommendations  Wheelchair (measurements PT);3in1 (PT) (3-in-1 with drop arm function. wheelchair will need elevating leg rests  bilaterally)    Recommendations for Other Services       Precautions / Restrictions Precautions Precautions: Fall Required Braces or Orthoses: Knee Immobilizer - Right;Other Brace (hinged knee brace LLE locked in extension for now; right KI) Knee Immobilizer - Right: On at all times;Other (comment) (can be removed for hygiene) Knee Immobilizer - Left: On at all times (can be removed for hygiene) Restrictions Weight Bearing Restrictions: Yes RLE Weight Bearing: Non weight bearing LLE Weight Bearing: Weight bearing as tolerated (with locked knee brace)      Mobility  Bed Mobility Overal bed mobility: Independent                  Transfers Overall transfer level: Needs assistance Equipment used: Rolling walker (2 wheeled) Transfers: Sit to/from [employer];Lateral/Scoot Transfers Sit to Stand: From elevated surface;Min assist;+2 physical assistance Stand pivot transfers: Min guard      Lateral/Scoot Transfers: Supervision General transfer comment: patient performed sit to stand transfer with bed height elevated with +2 person. options will be limited for standing at discharge unless the height can be significantly elevated due to limited knee ROM in extension with braces in place. she also needed cues to maintain NWB of RLE with pivot transfer to chair.  patient also performed a lateral scooting transfer from recliner chair to bed with recliner arm dropped down with supervision and occasional cues for technique. recommend lateral scoot transfer as primary means for transfers due to decreased assistance and ability to maintain weight bearing status.  Ambulation/Gait             General Gait Details: not attempted  [employer]  Mobility    Modified Rankin (Stroke Patients Only)       Balance Overall balance assessment: Needs assistance Sitting-balance support: Feet supported;Single extremity supported Sitting balance-Leahy  Scale: Good     Standing balance support: Bilateral upper extremity supported Standing balance-Leahy Scale: Fair                               Pertinent Vitals/Pain Pain Assessment: 0-10 Pain Score: 7  Pain Location: bilateral knees Pain Descriptors / Indicators: Discomfort Pain Intervention(s): Limited activity within patient's tolerance    Home Living Family/patient expects to be discharged to:: Private residence Living Arrangements: Spouse/significant other;Parent;Children Available Help at Discharge: Family;Available PRN/intermittently Type of Home: House Home Access: Stairs to enter Entrance Stairs-Rails: None Entrance Stairs-Number of Steps: 2 Home Layout: One level Home Equipment:  (pt reports may have a walker from a relative) Additional Comments: patient is planning to discharge to her parent's home with 2 steps to enter    Prior Function Level of Independence: Independent         Comments: 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 independent with ambulation prior     Hand Dominance   Dominant Hand: Right    Extremity/Trunk Assessment   Upper Extremity Assessment Upper Extremity Assessment: Overall WFL for tasks assessed    Lower Extremity Assessment Lower Extremity Assessment: RLE deficits/detail;LLE deficits/detail RLE Deficits / Details: RLE in KI. dorsiflexion/plantarflexion 5/5. RLE: Unable to fully assess due to immobilization RLE Sensation: WNL LLE Deficits / Details: LLE in KI. LLE: Unable to fully assess due to immobilization LLE Sensation: WNL       Communication   Communication: No difficulties  Cognition Arousal/Alertness: Awake/alert Behavior During Therapy: WFL for tasks assessed/performed Overall Cognitive Status: Within Functional Limits for tasks assessed                                        General Comments General comments (skin integrity, edema, etc.): patient educated on importance of maintaining  NWB of RLE with functional mobility efforts and options for transfer techniques.    Exercises Other Exercises Other Exercises: Pt educated re: OT role, DME recs, d/c recs, falls prevention, ECS, home/routines modifications Other Exercises: LBD, sup<>sit, sit<>stand, SPT, lateral scoot t/f, UBD, AE   Assessment/Plan    PT Assessment Patient needs continued PT services  PT Problem List Decreased strength;Decreased range of motion;Decreased activity tolerance;Decreased mobility;Decreased balance;Pain;Decreased safety awareness;Decreased knowledge of precautions;Decreased knowledge of use of DME       PT Treatment Interventions DME instruction;Gait training;Stair training;Functional mobility training;Therapeutic activities;Therapeutic exercise;Balance training;Neuromuscular re-education;Cognitive remediation;Patient/family education;Wheelchair mobility training    PT Goals (Current goals can be found in the Care Plan section)  Acute Rehab PT Goals Patient Stated Goal: to go home tomorrow PT Goal Formulation: With patient Time For Goal Achievement: 06/12/21 Potential to Achieve Goals: Good Additional Goals Additional Goal #1: patient propel and navigate wheelchair 1100ft, Mod I with no verbal cues in preparation for home and community mobility    Frequency 7X/week   Barriers to discharge Inaccessible home environment (steps to enter home)      Co-evaluation PT/OT/SLP Co-Evaluation/Treatment: Yes Reason for Co-Treatment: Complexity of the patient's impairments (multi-system involvement) PT goals addressed during session: Mobility/safety with mobility OT goals addressed during session: ADL's and self-care       AM-PAC PT "6 Clicks"  Mobility  Outcome Measure Help needed turning from your back to your side while in a flat bed without using bedrails?: None Help needed moving from lying on your back to sitting on the side of a flat bed without using bedrails?: A Little Help needed  moving to and from a bed to a chair (including a wheelchair)?: A Little Help needed standing up from a chair using your arms (e.g., wheelchair or bedside chair)?: A Little Help needed to walk in hospital room?: A Lot Help needed climbing 3-5 steps with a railing? : Total 6 Click Score: 16    End of Session   Activity Tolerance: Patient tolerated treatment well Patient left: in bed;with call bell/phone within reach;with bed alarm set Nurse Communication: Mobility status PT Visit Diagnosis: Unsteadiness on feet (R26.81);Other abnormalities of gait and mobility (R26.89);Pain    Time: 1210-1253 PT Time Calculation (min) (ACUTE ONLY): 43 min   Charges:   PT Evaluation $PT Eval Moderate Complexity: 1 Mod PT Treatments $Therapeutic Activity: 8-22 mins        Donna Bernard, PT, MPT   [employer] 05/29/2021, 3:16 PM

## 2021-05-29 NOTE — Progress Notes (Signed)
PROGRESS NOTE    Taylor [name]  MCN:[id number] DOB: 11/09/1985 DOA: 05/28/2021 PCP: Philemon Kingdom, MD   Brief Narrative:   35 y.o. female with medical history significant of anxiety, hypertension, depression, GERD, hypothyroid who presents to the ED after initially being evaluated for pain in the left knee on 8/14.  No fracture was noted at that time and as the patient was discharged in the ED she was referred to orthopedic surgery and given follow-up instructions.  She states that when she was getting into her car she twisted her right knee and injured that as well.  Patient now had pain in the anterior aspect of right knee.  Presented back to the emergency room and imaging of the right knee revealed tibial plateau fracture and right patellar fracture.  Orthopedics was consulted and recommended nonoperative management.  MRI left knee pursued.  Demonstrates total ACL tear.  Orthopedics aware.  Brace has been placed on patient.  Weightbearing as tolerated left lower extremity.  Therapy evaluations pending.   Assessment & Plan:   Active Problems:   Right medial tibial plateau fracture   Patellar fracture  Right medial tibial plateau fracture Right patellar fracture Left knee ACL tear Patient initially presented after left knee injury On discharge from ED twisted right knee and presented back Right knee with radiographic evidence of fracture Orthopedics consulted with nonoperative management recommended Plan: Continue multimodal pain control Therapy evaluations Possible discharge today or tomorrow Appreciate orthopedic follow-up   Essential hypertension Adequate blood pressure control PTA hydrochlorothiazide As needed hydralazine   Hypothyroidism PTA Synthroid   Depression/anxiety PTA Prozac PTA Xanax   GERD PPI   Obesity BMI 43.58 This complicates overall care and prognosis   DVT prophylaxis: SQ Lovenox Code Status: Full Family Communication: None today.   Significant other at bedside 8/15 Disposition Plan: Status is: Observation  The patient will require care spanning > 2 midnights and should be moved to inpatient because: Ongoing active pain requiring inpatient pain management and Inpatient level of care appropriate due to severity of illness  Dispo: The patient is from: Home              Anticipated d/c is to: Home              Patient currently is not medically stable to d/c.   Difficult to place patient No       Level of care: Med-Surg  Consultants:  Orthopedics  Procedures:  None  Antimicrobials:  None   Subjective: Seen and examined.  Still endorsing pain in bilateral knees, right greater than left.  Objective: Vitals:   05/28/21 1853 05/28/21 1937 05/29/21 0405 05/29/21 0750  BP: 129/87 128/80 119/74 128/79  Pulse: 71 84 60 68  Resp: 18 20 20 18   Temp: 97.6 F (36.4 C) 97.8 F (36.6 C) 98.2 F (36.8 C) 97.6 F (36.4 C)  TempSrc:      SpO2: 99% 100% 100% 100%  Weight:      Height:        Intake/Output Summary (Last 24 hours) at 05/29/2021 1110 Last data filed at 05/29/2021 0535 Gross per 24 hour  Intake --  Output 1700 ml  Net -1700 ml   Filed Weights   05/27/21 2353  Weight: 122.5 kg    Examination:  General exam: Appears calm and comfortable  Respiratory system: Clear to auscultation. Respiratory effort normal. Cardiovascular system: S1-S2, regular rate and rhythm, no murmurs, no pedal edema Gastrointestinal system: Abdomen is nondistended, soft and  nontender. No organomegaly or masses felt. Normal bowel sounds heard. Central nervous system: Alert and oriented. No focal neurological deficits. Extremities: Bilateral knees painful to palpation.  Decreased range of motion. Skin: No rashes, lesions or ulcers Psychiatry: Judgement and insight appear normal. Mood & affect appropriate.     Data Reviewed: I have personally reviewed following labs and imaging studies  CBC: Recent Labs  Lab  05/28/21 0627 05/29/21 0326  WBC 9.4 6.6  NEUTROABS 6.8  --   HGB 11.4* 10.5*  HCT 35.6* 33.1*  MCV 83.6 84.9  PLT 330 282   Basic Metabolic Panel: Recent Labs  Lab 05/28/21 0627 05/29/21 0326  NA 137 137  K 3.7 3.7  CL 103 106  CO2 27 27  GLUCOSE 111* 96  BUN 12 14  CREATININE 0.65 0.74  CALCIUM 8.3* 8.1*   GFR: Estimated Creatinine Clearance: 131.1 mL/min (by C-G formula based on SCr of 0.74 mg/dL). Liver Function Tests: No results for input(s): AST, ALT, ALKPHOS, BILITOT, PROT, ALBUMIN in the last 168 hours. No results for input(s): LIPASE, AMYLASE in the last 168 hours. No results for input(s): AMMONIA in the last 168 hours. Coagulation Profile: Recent Labs  Lab 05/29/21 0326  INR 1.0   Cardiac Enzymes: No results for input(s): CKTOTAL, CKMB, CKMBINDEX, TROPONINI in the last 168 hours. BNP (last 3 results) No results for input(s): PROBNP in the last 8760 hours. HbA1C: No results for input(s): HGBA1C in the last 72 hours. CBG: No results for input(s): GLUCAP in the last 168 hours. Lipid Profile: No results for input(s): CHOL, HDL, LDLCALC, TRIG, CHOLHDL, LDLDIRECT in the last 72 hours. Thyroid Function Tests: No results for input(s): TSH, T4TOTAL, FREET4, T3FREE, THYROIDAB in the last 72 hours. Anemia Panel: No results for input(s): VITAMINB12, FOLATE, FERRITIN, TIBC, IRON, RETICCTPCT in the last 72 hours. Sepsis Labs: No results for input(s): PROCALCITON, LATICACIDVEN in the last 168 hours.  Recent Results (from the past 240 hour(s))  Resp Panel by RT-PCR (Flu A&B, Covid) Nasopharyngeal Swab     Status: None   Collection Time: 05/28/21  6:27 AM   Specimen: Nasopharyngeal Swab; Nasopharyngeal(NP) swabs in vial transport medium  Result Value Ref Range Status   SARS Coronavirus 2 by RT PCR NEGATIVE NEGATIVE Final    Comment: (NOTE) SARS-CoV-2 target nucleic acids are NOT DETECTED.  The SARS-CoV-2 RNA is generally detectable in upper  respiratory specimens during the acute phase of infection. The lowest concentration of SARS-CoV-2 viral copies this assay can detect is 138 copies/mL. A negative result does not preclude SARS-Cov-2 infection and should not be used as the sole basis for treatment or other patient management decisions. A negative result may occur with  improper specimen collection/handling, submission of specimen other than nasopharyngeal swab, presence of viral mutation(s) within the areas targeted by this assay, and inadequate number of viral copies(<138 copies/mL). A negative result must be combined with clinical observations, patient history, and epidemiological information. The expected result is Negative.  Fact Sheet for Patients:  BloggerCourse.com  Fact Sheet for Healthcare Providers:  SeriousBroker.it  This test is no t yet approved or cleared by the [place] FDA and  has been authorized for detection and/or diagnosis of SARS-CoV-2 by FDA under an Emergency Use Authorization (EUA). This EUA will remain  in effect (meaning this test can be used) for the duration of the COVID-19 declaration under Section 564(b)(1) of the Act, 21 U.S.C.section 360bbb-3(b)(1), unless the authorization is terminated  or revoked sooner.  Influenza A by PCR NEGATIVE NEGATIVE Final   Influenza B by PCR NEGATIVE NEGATIVE Final    Comment: (NOTE) The Xpert Xpress SARS-CoV-2/FLU/RSV plus assay is intended as an aid in the diagnosis of influenza from Nasopharyngeal swab specimens and should not be used as a sole basis for treatment. Nasal washings and aspirates are unacceptable for Xpert Xpress SARS-CoV-2/FLU/RSV testing.  Fact Sheet for Patients: BloggerCourse.comhttps://www.fda.gov/media/152166/download  Fact Sheet for Healthcare Providers: SeriousBroker.ithttps://www.fda.gov/media/152162/download  This test is not yet approved or cleared by the [place] FDA and has been  authorized for detection and/or diagnosis of SARS-CoV-2 by FDA under an Emergency Use Authorization (EUA). This EUA will remain in effect (meaning this test can be used) for the duration of the COVID-19 declaration under Section 564(b)(1) of the Act, 21 U.S.C. section 360bbb-3(b)(1), unless the authorization is terminated or revoked.  Performed at [facility], [address] [place], [place] [address]          Radiology Studies: CT Knee Right Wo Contrast  Result Date: 05/28/2021 CLINICAL DATA:  Right knee pain after fall. EXAM: CT OF THE RIGHT KNEE WITHOUT CONTRAST TECHNIQUE: Multidetector CT imaging of the right knee was performed according to the standard protocol. Multiplanar CT image reconstructions were also generated. COMPARISON:  Right knee x-rays from same day. FINDINGS: Bones/Joint/Cartilage Acute mildly depressed fracture of the posterior lateral tibial plateau. Acute avulsion fracture of the nonarticular lateral tibial plateau. Acute tiny avulsion fracture of the inferomedial patella. Lateral patellar subluxation. No dislocation. Joint spaces are preserved. Small to moderate lipohemarthrosis. Ligaments Ligaments are suboptimally evaluated by CT. Muscles and Tendons Grossly intact. Soft tissue No fluid collection or hematoma.  No soft tissue mass. IMPRESSION: 1. Acute fractures of the lateral tibial plateau as described above. 2. Acute tiny avulsion fracture of the inferomedial patella. 3. Small to moderate lipohemarthrosis. Electronically Signed   By: Obie DredgeWilliam T Derry M.D.   On: 05/28/2021 05:55   MR KNEE LEFT WO CONTRAST  Result Date: 05/28/2021 CLINICAL DATA:  Knee trauma, meniscal/ligament injury suspected, xray done (Age >= 1y) Tibial plateau fracture (Age >= 1y) EXAM: MRI OF THE LEFT KNEE WITHOUT CONTRAST TECHNIQUE: Multiplanar, multisequence MR imaging of the knee was performed. No intravenous contrast was administered. COMPARISON:  CT 05/28/2021, radiograph  05/28/2021 FINDINGS: MENISCI Medial: Intact. Lateral: Intact. LIGAMENTS Cruciates: Complete tear of the proximal ACL.  The PCL is intact. Collaterals: Medial collateral ligament is intact. The popliteus tendon, lateral collateral ligament, and biceps femoris are intact. There is extracapsular edema near the posterolateral corner. CARTILAGE Patellofemoral:  Mild chondrosis. Medial:  No chondral defect. Lateral:  No chondral defect. JOINT: Moderate-sized joint effusion/hemarthrosis. POPLITEAL FOSSA: Small Baker cyst. EXTENSOR MECHANISM: Intact quadriceps tendon. Intact patellar tendon. Intact lateral patellar retinaculum. Intact medial patellar retinaculum. Intact MPFL. BONES: Pivot shift bony contusion pattern in the lateral femoral condyle and posterolateral tibial plateau. Acute, mild depressed fracture of the posterolateral tibial plateau. Avulsion fracture of the non articular lateral tibial plateau. Tiny subchondral fracture of the lateral femoral condyle (coronal T1 image 14). Other: No additional findings. IMPRESSION: Complete proximal ACL tear with pivot shift bony contusion pattern. Extracapsular edema along the posterolateral corner, likely related to adjacent posterolateral tibial plateau fracture, but can also be seen in the setting of a minor ligamentous PLC injury. Intact popliteus tendon, lateral collateral ligament, and biceps femoris. Acute fractures of the lateral tibial plateau as described above, better assessed on recent CT. Tiny subchondral fracture of the lateral femoral condyle. Moderate-sized joint effusion/hemarthrosis. No  evidence of meniscus tear. Electronically Signed   By: Caprice Renshaw M.D.   On: 05/28/2021 14:33   DG Knee Complete 4 Views Left  Result Date: 05/27/2021 CLINICAL DATA:  Left knee pain following fall, initial encounter EXAM: LEFT KNEE - COMPLETE 4+ VIEW COMPARISON:  None. FINDINGS: No evidence of fracture, dislocation, or joint effusion. No evidence of arthropathy or  other focal bone abnormality. Soft tissues are unremarkable. IMPRESSION: No acute abnormality noted. Electronically Signed   By: Alcide Clever M.D.   On: 05/27/2021 19:47   DG Knee Complete 4 Views Right  Result Date: 05/28/2021 CLINICAL DATA:  Fall EXAM: RIGHT KNEE - COMPLETE 4+ VIEW COMPARISON:  None. FINDINGS: Abnormal appearance of the lateral aspect of the right proximal tibia. No joint effusion. No dislocation. IMPRESSION: Abnormal appearance of the lateral aspect of the right proximal tibia, concerning for avulsion fracture. CT of the right knee suggestive Electronically Signed   By: Deatra Robinson M.D.   On: 05/28/2021 00:46        Scheduled Meds:  acetaminophen  1,000 mg Oral TID   calcium carbonate  2,500 mg Oral Daily   enoxaparin (LOVENOX) injection  0.5 mg/kg Subcutaneous Q24H   famotidine  20 mg Oral BID   FLUoxetine  40 mg Oral Daily   hydrochlorothiazide  12.5 mg Oral Daily   ketorolac  15 mg Intravenous Q6H   levothyroxine  125 mcg Oral QAC breakfast   pantoprazole  40 mg Oral Daily   senna  1 tablet Oral BID   Continuous Infusions:   LOS: 0 days    Time spent: 25 minutes    Tresa Moore, MD [facility] Pager [phone]  If 7PM-7AM, please contact night-coverage 05/29/2021, 11:10 AM

## 2021-05-29 NOTE — Progress Notes (Signed)
Subjective:  Patient reports pain as mild.  Resting comfortably in bed, being fit for a left knee brace  Objective:   VITALS:   Vitals:   05/28/21 1853 05/28/21 1937 05/29/21 0405 05/29/21 0750  BP: 129/87 128/80 119/74 128/79  Pulse: 71 84 60 68  Resp: 18 20 20 18   Temp: 97.6 F (36.4 C) 97.8 F (36.6 C) 98.2 F (36.8 C) 97.6 F (36.4 C)  TempSrc:      SpO2: 99% 100% 100% 100%  Weight:      Height:        PHYSICAL EXAM:  ABD soft Sensation intact distally Dorsiflexion/Plantar flexion intact No cellulitis present Compartment soft  LABS  Results for orders placed or performed during the hospital encounter of 05/28/21 (from the past 24 hour(s))  Basic metabolic panel     Status: Abnormal   Collection Time: 05/29/21  3:26 AM  Result Value Ref Range   Sodium 137 135 - 145 mmol/L   Potassium 3.7 3.5 - 5.1 mmol/L   Chloride 106 98 - 111 mmol/L   CO2 27 22 - 32 mmol/L   Glucose, Bld 96 70 - 99 mg/dL   BUN 14 6 - 20 mg/dL   Creatinine, Ser 05/31/21 0.44 - 1.00 mg/dL   Calcium 8.1 (L) 8.9 - 10.3 mg/dL   GFR, Estimated 1.61 >09 mL/min   Anion gap 4 (L) 5 - 15  CBC     Status: Abnormal   Collection Time: 05/29/21  3:26 AM  Result Value Ref Range   WBC 6.6 4.0 - 10.5 K/uL   RBC 3.90 3.87 - 5.11 MIL/uL   Hemoglobin 10.5 (L) 12.0 - 15.0 g/dL   HCT 05/31/21 (L) 45.4 - 09.8 %   MCV 84.9 80.0 - 100.0 fL   MCH 26.9 26.0 - 34.0 pg   MCHC 31.7 30.0 - 36.0 g/dL   RDW 11.9 14.7 - 82.9 %   Platelets 282 150 - 400 K/uL   nRBC 0.0 0.0 - 0.2 %  Protime-INR     Status: None   Collection Time: 05/29/21  3:26 AM  Result Value Ref Range   Prothrombin Time 13.2 11.4 - 15.2 seconds   INR 1.0 0.8 - 1.2    CT Knee Right Wo Contrast  Result Date: 05/28/2021 CLINICAL DATA:  Right knee pain after fall. EXAM: CT OF THE RIGHT KNEE WITHOUT CONTRAST TECHNIQUE: Multidetector CT imaging of the right knee was performed according to the standard protocol. Multiplanar CT image reconstructions were  also generated. COMPARISON:  Right knee x-rays from same day. FINDINGS: Bones/Joint/Cartilage Acute mildly depressed fracture of the posterior lateral tibial plateau. Acute avulsion fracture of the nonarticular lateral tibial plateau. Acute tiny avulsion fracture of the inferomedial patella. Lateral patellar subluxation. No dislocation. Joint spaces are preserved. Small to moderate lipohemarthrosis. Ligaments Ligaments are suboptimally evaluated by CT. Muscles and Tendons Grossly intact. Soft tissue No fluid collection or hematoma.  No soft tissue mass. IMPRESSION: 1. Acute fractures of the lateral tibial plateau as described above. 2. Acute tiny avulsion fracture of the inferomedial patella. 3. Small to moderate lipohemarthrosis. Electronically Signed   By: 05/30/2021 M.D.   On: 05/28/2021 05:55   MR KNEE LEFT WO CONTRAST  Result Date: 05/28/2021 CLINICAL DATA:  Knee trauma, meniscal/ligament injury suspected, xray done (Age >= 1y) Tibial plateau fracture (Age >= 1y) EXAM: MRI OF THE LEFT KNEE WITHOUT CONTRAST TECHNIQUE: Multiplanar, multisequence MR imaging of the knee was performed. No intravenous contrast was administered.  COMPARISON:  CT 05/28/2021, radiograph 05/28/2021 FINDINGS: MENISCI Medial: Intact. Lateral: Intact. LIGAMENTS Cruciates: Complete tear of the proximal ACL.  The PCL is intact. Collaterals: Medial collateral ligament is intact. The popliteus tendon, lateral collateral ligament, and biceps femoris are intact. There is extracapsular edema near the posterolateral corner. CARTILAGE Patellofemoral:  Mild chondrosis. Medial:  No chondral defect. Lateral:  No chondral defect. JOINT: Moderate-sized joint effusion/hemarthrosis. POPLITEAL FOSSA: Small Baker cyst. EXTENSOR MECHANISM: Intact quadriceps tendon. Intact patellar tendon. Intact lateral patellar retinaculum. Intact medial patellar retinaculum. Intact MPFL. BONES: Pivot shift bony contusion pattern in the lateral femoral condyle and  posterolateral tibial plateau. Acute, mild depressed fracture of the posterolateral tibial plateau. Avulsion fracture of the non articular lateral tibial plateau. Tiny subchondral fracture of the lateral femoral condyle (coronal T1 image 14). Other: No additional findings. IMPRESSION: Complete proximal ACL tear with pivot shift bony contusion pattern. Extracapsular edema along the posterolateral corner, likely related to adjacent posterolateral tibial plateau fracture, but can also be seen in the setting of a minor ligamentous PLC injury. Intact popliteus tendon, lateral collateral ligament, and biceps femoris. Acute fractures of the lateral tibial plateau as described above, better assessed on recent CT. Tiny subchondral fracture of the lateral femoral condyle. Moderate-sized joint effusion/hemarthrosis. No evidence of meniscus tear. Electronically Signed   By: Caprice Renshaw M.D.   On: 05/28/2021 14:33   DG Knee Complete 4 Views Left  Result Date: 05/27/2021 CLINICAL DATA:  Left knee pain following fall, initial encounter EXAM: LEFT KNEE - COMPLETE 4+ VIEW COMPARISON:  None. FINDINGS: No evidence of fracture, dislocation, or joint effusion. No evidence of arthropathy or other focal bone abnormality. Soft tissues are unremarkable. IMPRESSION: No acute abnormality noted. Electronically Signed   By: Alcide Clever M.D.   On: 05/27/2021 19:47   DG Knee Complete 4 Views Right  Result Date: 05/28/2021 CLINICAL DATA:  Fall EXAM: RIGHT KNEE - COMPLETE 4+ VIEW COMPARISON:  None. FINDINGS: Abnormal appearance of the lateral aspect of the right proximal tibia. No joint effusion. No dislocation. IMPRESSION: Abnormal appearance of the lateral aspect of the right proximal tibia, concerning for avulsion fracture. CT of the right knee suggestive Electronically Signed   By: Deatra Robinson M.D.   On: 05/28/2021 00:46    Assessment/Plan:     Active Problems:   Right medial tibial plateau fracture   Patellar fracture   Left  knee ACL tear and likely posterior-lateral corner injury   Up with therapy Plan for discharge tomorrow if cleared by PT, will likely need a walker and wheelchair with elevated leg rest  She may WBAT on the left while in the knee brace. Both knee braces to be removed for hygiene purposes only. Follow-up in the office next week for repeat check.   Lyndle Herrlich , MD 05/29/2021, 11:41 AM

## 2021-05-30 DIAGNOSIS — X501XXA Overexertion from prolonged static or awkward postures, initial encounter: Secondary | ICD-10-CM | POA: Diagnosis not present

## 2021-05-30 DIAGNOSIS — S83512A Sprain of anterior cruciate ligament of left knee, initial encounter: Secondary | ICD-10-CM | POA: Diagnosis present

## 2021-05-30 DIAGNOSIS — F32A Depression, unspecified: Secondary | ICD-10-CM | POA: Diagnosis present

## 2021-05-30 DIAGNOSIS — Z881 Allergy status to other antibiotic agents status: Secondary | ICD-10-CM | POA: Diagnosis not present

## 2021-05-30 DIAGNOSIS — Z20822 Contact with and (suspected) exposure to covid-19: Secondary | ICD-10-CM | POA: Diagnosis present

## 2021-05-30 DIAGNOSIS — S82131A Displaced fracture of medial condyle of right tibia, initial encounter for closed fracture: Secondary | ICD-10-CM | POA: Diagnosis not present

## 2021-05-30 DIAGNOSIS — Z833 Family history of diabetes mellitus: Secondary | ICD-10-CM | POA: Diagnosis not present

## 2021-05-30 DIAGNOSIS — S82044D Nondisplaced comminuted fracture of right patella, subsequent encounter for closed fracture with routine healing: Secondary | ICD-10-CM | POA: Diagnosis not present

## 2021-05-30 DIAGNOSIS — Z975 Presence of (intrauterine) contraceptive device: Secondary | ICD-10-CM | POA: Diagnosis not present

## 2021-05-30 DIAGNOSIS — Z79899 Other long term (current) drug therapy: Secondary | ICD-10-CM | POA: Diagnosis not present

## 2021-05-30 DIAGNOSIS — I10 Essential (primary) hypertension: Secondary | ICD-10-CM | POA: Diagnosis present

## 2021-05-30 DIAGNOSIS — Z8249 Family history of ischemic heart disease and other diseases of the circulatory system: Secondary | ICD-10-CM | POA: Diagnosis not present

## 2021-05-30 DIAGNOSIS — S82141A Displaced bicondylar fracture of right tibia, initial encounter for closed fracture: Secondary | ICD-10-CM | POA: Diagnosis present

## 2021-05-30 DIAGNOSIS — E039 Hypothyroidism, unspecified: Secondary | ICD-10-CM | POA: Diagnosis present

## 2021-05-30 DIAGNOSIS — F419 Anxiety disorder, unspecified: Secondary | ICD-10-CM | POA: Diagnosis present

## 2021-05-30 DIAGNOSIS — K219 Gastro-esophageal reflux disease without esophagitis: Secondary | ICD-10-CM | POA: Diagnosis present

## 2021-05-30 DIAGNOSIS — Z823 Family history of stroke: Secondary | ICD-10-CM | POA: Diagnosis not present

## 2021-05-30 DIAGNOSIS — Z6841 Body Mass Index (BMI) 40.0 and over, adult: Secondary | ICD-10-CM | POA: Diagnosis not present

## 2021-05-30 DIAGNOSIS — S82009A Unspecified fracture of unspecified patella, initial encounter for closed fracture: Secondary | ICD-10-CM | POA: Diagnosis present

## 2021-05-30 DIAGNOSIS — Z9884 Bariatric surgery status: Secondary | ICD-10-CM | POA: Diagnosis not present

## 2021-05-30 DIAGNOSIS — W1830XA Fall on same level, unspecified, initial encounter: Secondary | ICD-10-CM | POA: Diagnosis present

## 2021-05-30 DIAGNOSIS — Z9049 Acquired absence of other specified parts of digestive tract: Secondary | ICD-10-CM | POA: Diagnosis not present

## 2021-05-30 DIAGNOSIS — Z79890 Hormone replacement therapy (postmenopausal): Secondary | ICD-10-CM | POA: Diagnosis not present

## 2021-05-30 DIAGNOSIS — S82001A Unspecified fracture of right patella, initial encounter for closed fracture: Secondary | ICD-10-CM | POA: Diagnosis present

## 2021-05-30 DIAGNOSIS — Z803 Family history of malignant neoplasm of breast: Secondary | ICD-10-CM | POA: Diagnosis not present

## 2021-05-30 DIAGNOSIS — Z87891 Personal history of nicotine dependence: Secondary | ICD-10-CM | POA: Diagnosis not present

## 2021-05-30 DIAGNOSIS — S82131D Displaced fracture of medial condyle of right tibia, subsequent encounter for closed fracture with routine healing: Secondary | ICD-10-CM | POA: Diagnosis not present

## 2021-05-30 DIAGNOSIS — Z7982 Long term (current) use of aspirin: Secondary | ICD-10-CM | POA: Diagnosis not present

## 2021-05-30 MED ORDER — POLYETHYLENE GLYCOL 3350 17 G PO PACK
17.0000 g | PACK | Freq: Every day | ORAL | Status: DC | PRN
  Administered 2021-06-01: 17 g via ORAL
  Filled 2021-05-30: qty 1

## 2021-05-30 MED ORDER — OXYCODONE HCL 5 MG PO TABS
10.0000 mg | ORAL_TABLET | ORAL | Status: DC | PRN
Start: 2021-05-30 — End: 2021-06-02
  Administered 2021-05-30 – 2021-06-02 (×14): 10 mg via ORAL
  Filled 2021-05-30 (×14): qty 2

## 2021-05-30 NOTE — Progress Notes (Signed)
Occupational Therapy Treatment Patient Details Name: Taylor [name] MRN: [id number] DOB: 1986/05/17 Today's Date: 05/30/2021    History of present illness Patient is a 35 year old female with bilateral knee injuries. Left knee evaluated initially with knee immobilizer recommeneded and discharge home from ED. While getting into her car, she twisted her right knee and came back for evaluation. Found to have Left knee with acute fractures of the lateral tibial plateau, tiny subchondral fracture of the lateral femoral condyle, complete proximal ACL tear, extracapsular edema. Right knee minimally displaced tibial plateau fracture to be treated  non-operatively with bracing.   OT comments  Taylor [name] was seen for OT treatment on this date. Upon arrival to room pt reclined in bed reporting 7/10 pain, agreeable to ADL session. Pt requires SETUP & SUPERVISION don shorts rolling at bed level c MIN cues for sequencing. SBA lateral scoot t/f bed<>BSC c MIN cues to maintain NWBing pcns. SUPERVISION perihygiene with lateral leans. Extensive education given on home/routines modifications, falls prevention, adaoted dressing/bathing techniques.   Pt making good progress toward goals. Pt continues to benefit from skilled OT services to maximize return to PLOF and minimize risk of future falls, injury, caregiver burden, and readmission. Will continue to follow POC. Discharge recommendation remains appropriate. If pt unable to have hospital bed or 24/7 assistance then recommend STR for safe discharge.   Follow Up Recommendations  Home health OT;Supervision/Assistance - 24 hour (if pt unable to obtain 24/7 assistance STR may be appropriate)    Equipment Recommendations  3 in 1 bedside commode (drop arm bari [facility]); hospital bed   Recommendations for Other Services      Precautions / Restrictions Precautions Precautions: Fall Required Braces or Orthoses: Knee Immobilizer - Right;Other Brace Knee Immobilizer -  Right: On at all times (remove for hygiene) Knee Immobilizer - Left: On at all times (remove for hygiene) Restrictions Weight Bearing Restrictions: Yes RLE Weight Bearing: Non weight bearing LLE Weight Bearing: Weight bearing as tolerated       Mobility Bed Mobility Overal bed mobility: Independent                  Transfers Overall transfer level: Needs assistance   Transfers: Lateral/Scoot Transfers          Lateral/Scoot Transfers: Supervision General transfer comment: MIN cues to maintain NWBing for lateral scoot t/f    Balance Overall balance assessment: Needs assistance Sitting-balance support: Feet supported;Single extremity supported Sitting balance-Leahy Scale: Good                                     ADL either performed or assessed with clinical judgement   ADL Overall ADL's : Needs assistance/impaired                                       General ADL Comments: SETUP & SUPERVISION don shorts rolling at bed level. SBA lateral scoot t/f bed<>BSC      Cognition Arousal/Alertness: Awake/alert Behavior During Therapy: WFL for tasks assessed/performed Overall Cognitive Status: Within Functional Limits for tasks assessed                                          Exercises Exercises: Other  exercises Other Exercises Other Exercises: Pt educated re: DME recs, d/c recs, falls prevention, ECS, home/routines modifications Other Exercises: LBD, sup<>sit, lateral scoot t/f, UBD, AE training           Pertinent Vitals/ Pain       Pain Assessment: 0-10 Pain Score: 7  Pain Location: L knee Pain Descriptors / Indicators: Discomfort Pain Intervention(s): Limited activity within patient's tolerance;Premedicated before session;Patient requesting pain meds-RN notified         Frequency  Min 2X/week        Progress Toward Goals  OT Goals(current goals can now be found in the care plan section)   Progress towards OT goals: Progressing toward goals  Acute Rehab OT Goals Patient Stated Goal: to go to rehab OT Goal Formulation: With patient Time For Goal Achievement: 06/12/21 Potential to Achieve Goals: Good ADL Goals Pt Will Perform Lower Body Dressing: with min assist;with caregiver independent in assisting;sitting/lateral leans Pt Will Transfer to Toilet: with modified independence;with transfer board;bedside commode Pt Will Perform Toileting - Clothing Manipulation and hygiene: with modified independence;sitting/lateral leans  Plan Discharge plan remains appropriate;Frequency remains appropriate    Co-evaluation                 AM-PAC OT "6 Clicks" Daily Activity     Outcome Measure   Help from another person eating meals?: None Help from another person taking care of personal grooming?: None Help from another person toileting, which includes using toliet, bedpan, or urinal?: A Little Help from another person bathing (including washing, rinsing, drying)?: A Little Help from another person to put on and taking off regular upper body clothing?: None Help from another person to put on and taking off regular lower body clothing?: A Little 6 Click Score: 21    End of Session    OT Visit Diagnosis: Other abnormalities of gait and mobility (R26.89)   Activity Tolerance Patient tolerated treatment well   Patient Left in bed;with call bell/phone within reach;with bed alarm set;with nursing/sitter in room   Nurse Communication Mobility status;Patient requests pain meds        Time: 3244-0102 OT Time Calculation (min): 59 min  Charges: OT General Charges $OT Visit: 1 Visit OT Treatments $Self Care/Home Management : 38-52 mins $Therapeutic Activity: 8-22 mins  Kathie Dike, M.S. OTR/L  05/30/21, 2:45 PM  ascom [phone]

## 2021-05-30 NOTE — TOC Progression Note (Signed)
Transition of Care [facility]) - Progression Note    Patient Details  Name: Taylor [name] MRN: [id number] Date of Birth: May 22, 1986  Transition [facility] CM/SW [place], RN Phone Number: 05/30/2021, 12:50 PM  Clinical Narrative:     Met with the patient in the room, She would like to go to SNF for short term, Sent an insurance check to check to see if she has SNF benefits, if she goes home she will need a  WC with alternating elevated leg rests, she also needs a rolling walker and transfer chair as well as 3 in 1, she thinks her family has a walker she will use, she will order a transfer bench from [facility], she will need transport home as she is not able to get into a vehicle with both legs locked in extension, non weight bearing on one leg, she has stairs to go up as well. Will continue to monitor       Expected Discharge Plan and Services                                                 Social Determinants of Health (SDOH) Interventions    Readmission Risk Interventions No flowsheet data found.

## 2021-05-30 NOTE — Progress Notes (Signed)
OT Cancellation Note  Patient Details Name: Taylor [name] MRN: [id number] DOB: [birth year]   Cancelled Treatment:    Reason Eval/Treat Not Completed: Pain limiting ability to participate. Upon arrival pt reporting 10/10 L knee pain. Pt has received pain medication ~1 hour prior and continues to report 10/10 pain, RN notified. Will follow up at later time for [name] t/f training.   Kathie Dike, M.S. OTR/L  05/30/21, 9:30 AM  ascom [phone]

## 2021-05-30 NOTE — Progress Notes (Addendum)
Physical Therapy Treatment Patient Details Name: Taylor [name] MRN: [id number] DOB: [birth year] Today's Date: 05/30/2021    History of Present Illness Patient is a 35 year old female with bilateral knee injuries. Left knee evaluated initially with knee immobilizer recommeneded and discharge home from ED. While getting into her car, she twisted her right knee and came back for evaluation. Found to have Left knee with acute fractures of the lateral tibial plateau, tiny subchondral fracture of the lateral femoral condyle, complete proximal ACL tear, extracapsular edema. Right knee minimally displaced tibial plateau fracture to be treated  non-operatively with bracing.    PT Comments    Pt alert, cooperative, pleasant with family present throughout treatment. Pt able to recite new WB orders. WC education provided to pt and family regarding usage and mobility. Bilateral immobilizers/brace readjusted for pt. Pt was able to perform bilat. SLR raises with holds demonstrating good strength.  Pt is mod-I for bed mobility utilizing bed rails. Pt was unable to perform sit <> stand with new WB orders, RW, mod-A. Pt educated on lateral scooting for current transfers. Pt performed bed <> WC transfer with lateral scooting, CGA for environmental set-up and safety and cueing. Due to the patient's inability to stand within WB orders, CIR is now recommended  at discharge to maximize pt education, mobility, therapy services, and function. Pt has very supportive family. If unable to attend CIR, pt would benefit from SNF. Skilled PT intervention is indicated to address deficits in function, mobility, and to return to PLOF as able.     Follow Up Recommendations  SNF     Equipment Recommendations       Recommendations for Other Services       Precautions / Restrictions Precautions Precautions: Fall Required Braces or Orthoses: Knee Immobilizer - Right;Knee Immobilizer - Left Knee Immobilizer - Right: On at all  times Knee Immobilizer - Left: On at all times Restrictions Weight Bearing Restrictions: Yes RLE Weight Bearing: Weight bearing as tolerated LLE Weight Bearing: Touchdown weight bearing    Mobility  Bed Mobility Overal bed mobility: Modified Independent             General bed mobility comments: Pt utilizes bed rails for mobility    Transfers Overall transfer level: Needs assistance Equipment used: None Transfers: Lateral/Scoot Transfers;Sit to/from Stand Sit to Stand: From elevated surface;Mod assist        Lateral/Scoot Transfers: Supervision General transfer comment: Pt was unable to perform sit > stand while maintaining LLE WB orders. Pt is able to transfer bed <> WC with lateral scooting w/ supervision  Ambulation/Gait             General Gait Details: Not attempted   Stairs             Wheelchair Mobility    Modified Rankin (Stroke Patients Only)       Balance Overall balance assessment: Needs assistance Sitting-balance support: No upper extremity supported;Feet supported Sitting balance-Leahy Scale: Normal       Standing balance-Leahy Scale: Zero Standing balance comment: Pt is unable to come to upright standing                            Cognition Arousal/Alertness: Awake/alert Behavior During Therapy: WFL for tasks assessed/performed Overall Cognitive Status: Within Functional Limits for tasks assessed  Exercises Other Exercises Other Exercises: Pt & family education: WC safety/usage, home environment and considerations; Other Exercises: Supine <> EOB x 2 w/ supervision; Other Exercises: PT readjusted bilateral knee immobilzers    General Comments        Pertinent Vitals/Pain Pain Assessment: 0-10 Pain Score: 9  Pain Location: L knee, R knee Pain Descriptors / Indicators: Discomfort;Aching;Sore Pain Intervention(s): Limited activity within patient's  tolerance;Monitored during session;Repositioned;Patient requesting pain meds-RN notified;Ice applied    Home Living                      Prior Function            PT Goals (current goals can now be found in the care plan section) Acute Rehab PT Goals Patient Stated Goal: to go to rehab PT Goal Formulation: With patient Time For Goal Achievement: 06/12/21 Potential to Achieve Goals: Good Additional Goals Additional Goal #1: patient propel and navigate wheelchair 177ft, Mod I with no verbal cues in preparation for home and community mobility Progress towards PT goals: Progressing toward goals    Frequency    7X/week      PT Plan Discharge plan needs to be updated    Co-evaluation              AM-PAC PT "6 Clicks" Mobility   Outcome Measure  Help needed turning from your back to your side while in a flat bed without using bedrails?: A Little Help needed moving from lying on your back to sitting on the side of a flat bed without using bedrails?: A Little Help needed moving to and from a bed to a chair (including a wheelchair)?: A Little Help needed standing up from a chair using your arms (e.g., wheelchair or bedside chair)?: A Lot Help needed to walk in hospital room?: A Lot Help needed climbing 3-5 steps with a railing? : Total 6 Click Score: 14    End of Session Equipment Utilized During Treatment: Gait belt Activity Tolerance: Patient tolerated treatment well Patient left: in bed;with call bell/phone within reach;with bed alarm set;with family/visitor present;with nursing/sitter in room Nurse Communication: Mobility status PT Visit Diagnosis: Unsteadiness on feet (R26.81);Other abnormalities of gait and mobility (R26.89) Pain - Right/Left: Left     Time: 2094-7096 PT Time Calculation (min) (ACUTE ONLY): 60 min  Charges:                        [employer], SPT

## 2021-05-30 NOTE — Progress Notes (Addendum)
PROGRESS NOTE    Taylor [name]  NWG:[id number] DOB: [birth year] DOA: 05/28/2021 PCP: Philemon Kingdom, MD   Brief Narrative:   35 y.o. female with medical history significant of anxiety, hypertension, depression, GERD, hypothyroid who presents to the ED after initially being evaluated for pain in the left knee on 8/14.  No fracture was noted at that time and as the patient was discharged in the ED she was referred to orthopedic surgery and given follow-up instructions.  She states that when she was getting into her car she twisted her right knee and injured that as well.  Patient now had pain in the anterior aspect of right knee.  Presented back to the emergency room and imaging of the right knee revealed tibial plateau fracture and right patellar fracture.  Orthopedics was consulted and recommended nonoperative management.  MRI left knee pursued.  Demonstrates total ACL tear.  Orthopedics aware.  Brace has been placed on patient.  Weightbearing as tolerated left lower extremity.  Therapy evaluations pending.  8/17- c/o 10/10 pain and that po pain med not working at current dose  Assessment & Plan:   Active Problems:   Right medial tibial plateau fracture   Patellar fracture  Right medial tibial plateau fracture Right patellar fracture Left knee ACL tear Patient initially presented after left knee injury On discharge from ED twisted right knee and presented back Right knee with radiographic evidence of fracture Orthopedics consulted with nonoperative management 8/17 8/17 patient asking for second opinion requesting to see Dr. Rosita Kea.  I relayed the message to Dr. Rosita Kea who will see the patient Will increase oxycodone to 10 mg as 5 is not controlling her pain Continue therapy Continue with therapy while here   Essential hypertension Stable Continue HCTZ Continue IV hydralazine as needed      Hypothyroidism Continue Synthroid   Depression/anxiety Continue Prozac and  Xanax   GERD PPI   Obesity BMI 43.58 This complicates overall care and prognosis   DVT prophylaxis: SQ Lovenox Code Status: Full Family Communication: None at bedside   Disposition Plan: Status is: Inpatient  The patient will require care spanning > 2 midnights and should be moved to inpatient because: Ongoing active pain requiring inpatient pain management and Inpatient level of care appropriate due to severity of illness  Dispo: The patient is from: Home              Anticipated d/c is to: Home              Patient currently is not medically stable to d/c.   Difficult to place patient No       Level of care: Med-Surg  Consultants:  Orthopedics  Procedures:  None  Antimicrobials:  None   Subjective: Still with extreme pain requiring IV pain meds.  No shortness of breath or chest pain or any other complaints.  Having a hard time ambulating or putting weight on her lower extremities  Objective: Vitals:   05/30/21 0014 05/30/21 0426 05/30/21 0750 05/30/21 1115  BP: 116/68 132/80 122/79 (!) 143/87  Pulse: (!) 51 (!) 58 (!) 52 62  Resp: 17 17 16 18   Temp: 97.8 F (36.6 C) 97.7 F (36.5 C) 98.2 F (36.8 C) 98.1 F (36.7 C)  TempSrc:    Oral  SpO2: 100% 100% 100% 100%  Weight:      Height:        Intake/Output Summary (Last 24 hours) at 05/30/2021 1609 Last data filed at 05/30/2021 1417 Gross  per 24 hour  Intake 0 ml  Output 400 ml  Net -400 ml   Filed Weights   05/27/21 2353  Weight: 122.5 kg    Examination:  Tearful, NAD CTA no wheeze rales rhonchi's Regular S1-S2 no gallops Soft benign positive bowel sounds Aaxox4 Mood and affect appropriate in current setting   Data Reviewed: I have personally reviewed following labs and imaging studies  CBC: Recent Labs  Lab 05/28/21 0627 05/29/21 0326  WBC 9.4 6.6  NEUTROABS 6.8  --   HGB 11.4* 10.5*  HCT 35.6* 33.1*  MCV 83.6 84.9  PLT 330 282   Basic Metabolic Panel: Recent Labs  Lab  05/28/21 0627 05/29/21 0326  NA 137 137  K 3.7 3.7  CL 103 106  CO2 27 27  GLUCOSE 111* 96  BUN 12 14  CREATININE 0.65 0.74  CALCIUM 8.3* 8.1*   GFR: Estimated Creatinine Clearance: 131.1 mL/min (by C-G formula based on SCr of 0.74 mg/dL). Liver Function Tests: No results for input(s): AST, ALT, ALKPHOS, BILITOT, PROT, ALBUMIN in the last 168 hours. No results for input(s): LIPASE, AMYLASE in the last 168 hours. No results for input(s): AMMONIA in the last 168 hours. Coagulation Profile: Recent Labs  Lab 05/29/21 0326  INR 1.0   Cardiac Enzymes: No results for input(s): CKTOTAL, CKMB, CKMBINDEX, TROPONINI in the last 168 hours. BNP (last 3 results) No results for input(s): PROBNP in the last 8760 hours. HbA1C: No results for input(s): HGBA1C in the last 72 hours. CBG: No results for input(s): GLUCAP in the last 168 hours. Lipid Profile: No results for input(s): CHOL, HDL, LDLCALC, TRIG, CHOLHDL, LDLDIRECT in the last 72 hours. Thyroid Function Tests: No results for input(s): TSH, T4TOTAL, FREET4, T3FREE, THYROIDAB in the last 72 hours. Anemia Panel: No results for input(s): VITAMINB12, FOLATE, FERRITIN, TIBC, IRON, RETICCTPCT in the last 72 hours. Sepsis Labs: No results for input(s): PROCALCITON, LATICACIDVEN in the last 168 hours.  Recent Results (from the past 240 hour(s))  Resp Panel by RT-PCR (Flu A&B, Covid) Nasopharyngeal Swab     Status: None   Collection Time: 05/28/21  6:27 AM   Specimen: Nasopharyngeal Swab; Nasopharyngeal(NP) swabs in vial transport medium  Result Value Ref Range Status   SARS Coronavirus 2 by RT PCR NEGATIVE NEGATIVE Final    Comment: (NOTE) SARS-CoV-2 target nucleic acids are NOT DETECTED.  The SARS-CoV-2 RNA is generally detectable in upper respiratory specimens during the acute phase of infection. The lowest concentration of SARS-CoV-2 viral copies this assay can detect is 138 copies/mL. A negative result does not preclude  SARS-Cov-2 infection and should not be used as the sole basis for treatment or other patient management decisions. A negative result may occur with  improper specimen collection/handling, submission of specimen other than nasopharyngeal swab, presence of viral mutation(s) within the areas targeted by this assay, and inadequate number of viral copies(<138 copies/mL). A negative result must be combined with clinical observations, patient history, and epidemiological information. The expected result is Negative.  Fact Sheet for Patients:  BloggerCourse.com  Fact Sheet for Healthcare Providers:  SeriousBroker.it  This test is no t yet approved or cleared by the [place] FDA and  has been authorized for detection and/or diagnosis of SARS-CoV-2 by FDA under an Emergency Use Authorization (EUA). This EUA will remain  in effect (meaning this test can be used) for the duration of the COVID-19 declaration under Section 564(b)(1) of the Act, 21 U.S.C.section 360bbb-3(b)(1), unless the authorization is terminated  or revoked sooner.       Influenza A by PCR NEGATIVE NEGATIVE Final   Influenza B by PCR NEGATIVE NEGATIVE Final    Comment: (NOTE) The Xpert Xpress SARS-CoV-2/FLU/RSV plus assay is intended as an aid in the diagnosis of influenza from Nasopharyngeal swab specimens and should not be used as a sole basis for treatment. Nasal washings and aspirates are unacceptable for Xpert Xpress SARS-CoV-2/FLU/RSV testing.  Fact Sheet for Patients: BloggerCourse.com  Fact Sheet for Healthcare Providers: SeriousBroker.it  This test is not yet approved or cleared by the [place] FDA and has been authorized for detection and/or diagnosis of SARS-CoV-2 by FDA under an Emergency Use Authorization (EUA). This EUA will remain in effect (meaning this test can be used) for the duration of  the COVID-19 declaration under Section 564(b)(1) of the Act, 21 U.S.C. section 360bbb-3(b)(1), unless the authorization is terminated or revoked.  Performed at [facility], [address], [place], [place] [address]          Radiology Studies: No results found.      Scheduled Meds:  acetaminophen  1,000 mg Oral TID   calcium carbonate  2,500 mg Oral Daily   enoxaparin (LOVENOX) injection  0.5 mg/kg Subcutaneous Q24H   famotidine  20 mg Oral BID   FLUoxetine  40 mg Oral Daily   hydrochlorothiazide  12.5 mg Oral Daily   ketorolac  15 mg Intravenous Q6H   levothyroxine  125 mcg Oral QAC breakfast   pantoprazole  40 mg Oral Daily   senna  1 tablet Oral BID   Continuous Infusions:   LOS: 0 days    Time spent: 35 minutes with more than 50% on COC   Lynn Ito, MD [facility] Pager [phone]  If 7PM-7AM, please contact night-coverage 05/30/2021, 4:09 PM

## 2021-05-30 NOTE — TOC Progression Note (Signed)
Transition of Care [facility]) - Progression Note    Patient Details  Name: Taylor [name] MRN: [id number] Date of Birth: 25-Aug-1986  Transition of Care [facility]) CM/SW [place], RN Phone Number: 05/30/2021, 2:47 PM  Clinical Narrative:    Met with the patient and notified her that her insurance does cover SNF but there is a 20% copay, Reviewed the options that are local in her plan to choose from, she stated that she wants me to check with [facility] in [place], Lynn with [facility] in admissions, they do not accept [employer], notified the patient        Expected Discharge Plan and Services                                                 Social Determinants of Health (SDOH) Interventions    Readmission Risk Interventions No flowsheet data found.

## 2021-05-31 DIAGNOSIS — I10 Essential (primary) hypertension: Secondary | ICD-10-CM | POA: Diagnosis not present

## 2021-05-31 DIAGNOSIS — S82131D Displaced fracture of medial condyle of right tibia, subsequent encounter for closed fracture with routine healing: Secondary | ICD-10-CM | POA: Diagnosis not present

## 2021-05-31 DIAGNOSIS — S82044D Nondisplaced comminuted fracture of right patella, subsequent encounter for closed fracture with routine healing: Secondary | ICD-10-CM | POA: Diagnosis not present

## 2021-05-31 MED ORDER — DOCUSATE SODIUM 100 MG PO CAPS
100.0000 mg | ORAL_CAPSULE | Freq: Two times a day (BID) | ORAL | Status: DC
  Administered 2021-05-31 – 2021-06-02 (×5): 100 mg via ORAL
  Filled 2021-05-31 (×5): qty 1

## 2021-05-31 NOTE — TOC Progression Note (Signed)
Transition of Care [facility]) - Progression Note    Patient Details  Name: Taylor [name] MRN: [id number] Date of Birth: 16-Sep-1986  Transition of Care [facility]) CM/SW Contact  Barrie Dunker, RN Phone Number: 05/31/2021, 2:17 PM  Clinical Narrative:    The patient will accept the bed from [facility], [name] with [facility] to get auth from [facility], the patient has had 2 Covid Vaccines and does not want the booster, she is aware she could be in quarantine as a result and states agreement, I explained that any equipment will be set up prior to DC home by the facility, as well as any [facility] services needed.  She stated understanding        Expected Discharge Plan and Services                                                 Social Determinants of Health (SDOH) Interventions    Readmission Risk Interventions No flowsheet data found.

## 2021-05-31 NOTE — TOC Progression Note (Addendum)
Transition of Care [facility]) - Progression Note    Patient Details  Name: Taylor [name] MRN: [id number] Date of Birth: 03/04/1986  Transition of Care [facility]) CM/SW Contact  Barrie Dunker, RN Phone Number: 05/31/2021, 8:46 AM  Clinical Narrative:    The patient is agreeable to do a bedsearch for [facility] and [employer] that is INN with her insurance, if no bed available she will need a Hospital bed and a Bariatric Wheelchair and 3 in 1 with a drop arm, she will purchase a transfer bench and a Rolling walker, awaiting response from [employer] and [employer] does not have a bed, [employer] is checking to make sure they are INN with [facility], they will let me know soon      Expected Discharge Plan and Services                                                 Social Determinants of Health (SDOH) Interventions    Readmission Risk Interventions No flowsheet data found.

## 2021-05-31 NOTE — Progress Notes (Signed)
Physical Therapy Treatment Patient Details Name: Taylor [name] MRN: [id number] DOB: 1985-12-21 Today's Date: 05/31/2021    History of Present Illness Patient is a 35 year old female with bilateral knee injuries. Left knee evaluated initially with knee immobilizer recommeneded and discharge home from ED. While getting into her car, she twisted her right knee and came back for evaluation. Found to have Left knee with acute fractures of the lateral tibial plateau, tiny subchondral fracture of the lateral femoral condyle, complete proximal ACL tear, extracapsular edema. Right knee minimally displaced tibial plateau fracture to be treated  non-operatively with bracing.    PT Comments    Pt alert, motivated and cooperative for therapy. Pt requires mod-I for bed mobility using bed rails. Pt is able to laterally scoot bed > BSC w/ BSC next to bed w/ min-guard for safety. Sit <> stand transfers w/ RW, min-A for precautions with elevated bed height. Pt was able to demonstrate forward, backward stepping w/ RW, min-guard while maintaining WB precautions. Pt is unable to transfer from a mid-elevated bed height while maintaining WB precautions. Skilled PT intervention is indicated to address deficits in function, mobility, and to return to PLOF as able.     Follow Up Recommendations  SNF;CIR     Equipment Recommendations  Wheelchair (measurements PT);3in1 (PT)    Recommendations for Other Services       Precautions / Restrictions Precautions Precautions: Fall Required Braces or Orthoses: Knee Immobilizer - Right;Knee Immobilizer - Left Knee Immobilizer - Right: On at all times Knee Immobilizer - Left: On at all times Restrictions Weight Bearing Restrictions: Yes RLE Weight Bearing: Weight bearing as tolerated LLE Weight Bearing: Touchdown weight bearing    Mobility  Bed Mobility Overal bed mobility: Modified Independent             General bed mobility comments: Pt utilizes bed rails  for mobility    Transfers Overall transfer level: Needs assistance Equipment used: Rolling walker (2 wheeled) Transfers: Lateral/Scoot Transfers;Sit to/from Stand Sit to Stand: From elevated surface;Min assist        Lateral/Scoot Transfers: Min guard General transfer comment: Pt is able to transfer bed <>BSC w/ min-gaurd for safety w/ lateral scoot. Sit > stand from elevated bed height w/ min-A for WB precautions. Pt requires elevated bed height to maintain WB precautions  Ambulation/Gait             General Gait Details: Not attempted   Stairs             Wheelchair Mobility    Modified Rankin (Stroke Patients Only)       Balance Overall balance assessment: Needs assistance Sitting-balance support: No upper extremity supported;Feet supported Sitting balance-Leahy Scale: Normal     Standing balance support: Bilateral upper extremity supported Standing balance-Leahy Scale: Poor Standing balance comment: Pt requires BUE to stand                            Cognition Arousal/Alertness: Awake/alert Behavior During Therapy: WFL for tasks assessed/performed Overall Cognitive Status: Within Functional Limits for tasks assessed                                        Exercises Other Exercises Other Exercises: Pt able to take step forward/backward x 2 w/ RW while maintaining WB precautions    General Comments  Pertinent Vitals/Pain Pain Assessment: 0-10 Pain Score: 3  Pain Location: L knee, R knee Pain Descriptors / Indicators: Discomfort;Aching;Sore Pain Intervention(s): Limited activity within patient's tolerance;Monitored during session;Patient requesting pain meds-RN notified    Home Living                      Prior Function            PT Goals (current goals can now be found in the care plan section) Acute Rehab PT Goals Patient Stated Goal: to go to rehab PT Goal Formulation: With patient Time For  Goal Achievement: 06/12/21 Potential to Achieve Goals: Good Progress towards PT goals: Progressing toward goals    Frequency    7X/week      PT Plan Current plan remains appropriate    Co-evaluation              AM-PAC PT "6 Clicks" Mobility   Outcome Measure  Help needed turning from your back to your side while in a flat bed without using bedrails?: A Little Help needed moving from lying on your back to sitting on the side of a flat bed without using bedrails?: A Little Help needed moving to and from a bed to a chair (including a wheelchair)?: A Little Help needed standing up from a chair using your arms (e.g., wheelchair or bedside chair)?: A Lot Help needed to walk in hospital room?: A Lot Help needed climbing 3-5 steps with a railing? : Total 6 Click Score: 14    End of Session Equipment Utilized During Treatment: Gait belt Activity Tolerance: Patient tolerated treatment well Patient left: in bed;with call bell/phone within reach;with bed alarm set;with family/visitor present Nurse Communication: Mobility status PT Visit Diagnosis: Unsteadiness on feet (R26.81);Other abnormalities of gait and mobility (R26.89)     Time: 3235-5732 PT Time Calculation (min) (ACUTE ONLY): 36 min  Charges:                        [employer], SPT

## 2021-05-31 NOTE — TOC Progression Note (Signed)
Transition of Care [facility]) - Progression Note    Patient Details  Name: Taylor [name] MRN: [id number] Date of Birth: 08-04-1986  Transition of Care [facility]) CM/SW Contact  Barrie Dunker, RN Phone Number: 05/31/2021, 4:08 PM  Clinical Narrative:      Spoke with Tiffany at [facility], she stated the copay is 20% and they would require $1400 up front, they will start the insurance approval process and the payment has to be made prior to admitting once insurance approved, I made the patient aware, she stated that she would definitely want to wait until insurance approved the stay to make the payment,       Expected Discharge Plan and Services                                                 Social Determinants of Health (SDOH) Interventions    Readmission Risk Interventions No flowsheet data found.

## 2021-05-31 NOTE — NC FL2 (Signed)
[place] MEDICAID FL2 LEVEL OF CARE SCREENING TOOL     IDENTIFICATION  Patient Name: Taylor [name] Birthdate: 03/15/1986 Sex: female Admission Date (Current Location): 05/28/2021  [facility] and [place] Number:  Chiropodist and Address:  [facility], [address], [place], [place] [address]      Provider Number: [id number]  Attending Physician Name and Address:  Lynn Ito, MD  Relative Name and Phone Number:  Misty Stanley [phone]    Current Level of Care: Hospital Recommended Level of Care: Skilled Nursing Facility Prior Approval Number:    Date Approved/Denied:   PASRR Number: [id number]  Discharge Plan: SNF    Current Diagnoses: Patient Active Problem List   Diagnosis Date Noted   Right medial tibial plateau fracture 05/28/2021   Patellar fracture 05/28/2021   S/P laparoscopic sleeve gastrectomy Dec 2018 09/16/2017   Morbid obesity (HCC) 09/16/2017   Essential hypertension 08/08/2016   PVCs (premature ventricular contractions) 08/06/2016   Cholecystitis 06/03/2016   Abdominal pain 05/28/2016   Gallstones    Normal pregnancy 11/21/2014   Echogenic intracardiac focus of fetus on prenatal ultrasound    Obesity complicating pregnancy    Pre-existing essential hypertension complicating pregnancy in third trimester    [redacted] weeks gestation of pregnancy    [redacted] weeks gestation of pregnancy    Chronic hypertension complicating or reason for care during pregnancy    Obesity in pregnancy, antepartum    [redacted] weeks gestation of pregnancy     Orientation RESPIRATION BLADDER Height & Weight     Self, Time, Situation, Place  Normal Continent, External catheter Weight: 122.5 kg Height:  5\' 6"  (167.6 cm)  BEHAVIORAL SYMPTOMS/MOOD NEUROLOGICAL BOWEL NUTRITION STATUS      Continent Diet (regular)  AMBULATORY STATUS COMMUNICATION OF NEEDS Skin   Extensive Assist Verbally Normal (bilateral leg braces locked extension)                        Personal Care Assistance Level of Assistance  Dressing, Bathing Bathing Assistance: Limited assistance   Dressing Assistance: Limited assistance     Functional Limitations Info             SPECIAL CARE FACTORS FREQUENCY  PT (By licensed PT), OT (By licensed OT)     PT Frequency: 5 times per week OT Frequency: 5 times per week            Contractures Contractures Info: Not present    Additional Factors Info  Code Status, Allergies Code Status Info: FUll code Allergies Info: Keflex           Current Medications (05/31/2021):  This is the current hospital active medication list Current Facility-Administered Medications  Medication Dose Route Frequency Provider Last Rate Last Admin   acetaminophen (TYLENOL) tablet 650 mg  650 mg Oral Q6H PRN Sreenath, Sudheer B, MD       Or   acetaminophen (TYLENOL) suppository 650 mg  650 mg Rectal Q6H PRN 06/02/2021, Sudheer B, MD       acetaminophen (TYLENOL) tablet 1,000 mg  1,000 mg Oral TID Georgeann Oppenheim B, MD   1,000 mg at 05/30/21 2144   albuterol (PROVENTIL) (2.5 MG/3ML) 0.083% nebulizer solution 2.5 mg  2.5 mg Nebulization Q2H PRN Sreenath, Sudheer B, MD       ALPRAZolam 2145) tablet 0.25 mg  0.25 mg Oral BID PRN Prudy Feeler B, MD       calcium carbonate (OS-CAL - dosed in  mg of elemental calcium) tablet 2,500 mg  2,500 mg Oral Daily Sreenath, Sudheer B, MD   2,500 mg at 05/30/21 1148   enoxaparin (LOVENOX) injection 62.5 mg  0.5 mg/kg Subcutaneous Q24H Foye Deer, RPH   62.5 mg at 05/30/21 2144   famotidine (PEPCID) tablet 20 mg  20 mg Oral BID Lolita Patella B, MD   20 mg at 05/30/21 2144   FLUoxetine (PROZAC) capsule 40 mg  40 mg Oral Daily Lolita Patella B, MD   40 mg at 05/30/21 1148   hydrALAZINE (APRESOLINE) injection 10 mg  10 mg Intravenous Q6H PRN Lolita Patella B, MD       hydrochlorothiazide (MICROZIDE) capsule 12.5 mg  12.5 mg Oral Daily Sreenath, Sudheer B, MD   12.5 mg at 05/30/21 0820    ketorolac (TORADOL) 30 MG/ML injection 15 mg  15 mg Intravenous Q6H Sreenath, Sudheer B, MD   15 mg at 05/31/21 0155   levothyroxine (SYNTHROID) tablet 125 mcg  125 mcg Oral QAC breakfast Lolita Patella B, MD   125 mcg at 05/31/21 0558   methocarbamol (ROBAXIN) tablet 750 mg  750 mg Oral Q8H PRN Lolita Patella B, MD   750 mg at 05/31/21 0155   morphine 2 MG/ML injection 2 mg  2 mg Intravenous Q2H PRN Lolita Patella B, MD   2 mg at 05/30/21 1635   ondansetron (ZOFRAN) tablet 4 mg  4 mg Oral Q6H PRN Lolita Patella B, MD       Or   ondansetron (ZOFRAN) injection 4 mg  4 mg Intravenous Q6H PRN Lolita Patella B, MD   4 mg at 05/30/21 0428   oxyCODONE (Oxy IR/ROXICODONE) immediate release tablet 10 mg  10 mg Oral Q4H PRN Lynn Ito, MD   10 mg at 05/31/21 0558   pantoprazole (PROTONIX) EC tablet 40 mg  40 mg Oral Daily Lolita Patella B, MD   40 mg at 05/30/21 0820   polyethylene glycol (MIRALAX / GLYCOLAX) packet 17 g  17 g Oral Daily PRN Lynn Ito, MD       senna (SENOKOT) tablet 8.6 mg  1 tablet Oral BID Lolita Patella B, MD   8.6 mg at 05/30/21 2144     Discharge Medications: Please see discharge summary for a list of discharge medications.  Relevant Imaging Results:  Relevant Lab Results:   Additional Information SS# [id number], had 2 covid vaccines no booster  Barrie Dunker, RN

## 2021-05-31 NOTE — Progress Notes (Signed)
I reviewed the CT with the patient and her husband examination of the right knee I do not think she has instability so I recommend discontinuing the knee immobilizer with weightbearing as tolerated on the right.  Additionally on the left I think she can have this out of large extension.  If she walks without the knee being locked and feels and unstable she can have the knee locked in full extension on the left this may aid in her ability to ambulate.

## 2021-05-31 NOTE — Progress Notes (Signed)
PROGRESS NOTE    Taylor [name]  XTK:[id number] DOB: [birth year] DOA: 05/28/2021 PCP: Philemon Kingdom, MD   Brief Narrative:   35 y.o. female with medical history significant of anxiety, hypertension, depression, GERD, hypothyroid who presents to the ED after initially being evaluated for pain in the left knee on 8/14.  No fracture was noted at that time and as the patient was discharged in the ED she was referred to orthopedic surgery and given follow-up instructions.  She states that when she was getting into her car she twisted her right knee and injured that as well.  Patient now had pain in the anterior aspect of right knee.  Presented back to the emergency room and imaging of the right knee revealed tibial plateau fracture and right patellar fracture.  Orthopedics was consulted and recommended nonoperative management.  MRI left knee pursued.  Demonstrates total ACL tear.  Orthopedics aware.  Brace has been placed on patient.  Weightbearing as tolerated left lower extremity.  Therapy evaluations pending.  8/17- c/o 10/10 pain and that po pain med not working at current dose 8/18-pain controlled. Snf pending  Assessment & Plan:   Active Problems:   Right medial tibial plateau fracture   Patellar fracture  Right medial tibial plateau fracture Right patellar fracture Left knee ACL tear Patient initially presented after left knee injury On discharge from ED twisted right knee and presented back Right knee with radiographic evidence of fracture Orthopedics consulted with nonoperative management 8/17 8/17 patient asking for second opinion requesting to see Dr. Rosita Kea.  I relayed the message to Dr. Rosita Kea who will see the patient Will increase oxycodone to 10 mg as 5 is not controlling her pain Continue therapy 8/18- PT rec. Snf. Pain better controlled    Essential hypertension Stable.  Continue HCTZ. IV hydralazine as needed      Hypothyroidism Continue Synthroid    Depression/anxiety Continue Prozac and Xanax   GERD PPI   Obesity BMI 43.58 This complicates overall care and prognosis   DVT prophylaxis: SQ Lovenox Code Status: Full Family Communication: None at bedside   Disposition Plan: Status is: Inpatient  The patient will require care spanning > 2 midnights and should be moved to inpatient because: Ongoing active pain requiring inpatient pain management and Inpatient level of care appropriate due to severity of illness  Dispo: The patient is from: Home              Anticipated d/c is to: SNF pending              Patient currently is not medically stable to d/c.   Difficult to place patient No       Level of care: Med-Surg  Consultants:  Orthopedics  Procedures:  None  Antimicrobials:  None   Subjective: Pain better controlled with changing of pain meds.  Denies any shortness of breath or chest pain.  Objective: Vitals:   05/30/21 2042 05/31/21 0414 05/31/21 0755 05/31/21 1216  BP: 130/79 (!) 127/93 (!) 144/87 119/70  Pulse: (!) 53 63 60 (!) 58  Resp: 17 17 17    Temp: 98.2 F (36.8 C) 97.9 F (36.6 C) 97.9 F (36.6 C) 98.2 F (36.8 C)  TempSrc:    Oral  SpO2: 97% 100% 100% 100%  Weight:      Height:        Intake/Output Summary (Last 24 hours) at 05/31/2021 1433 Last data filed at 05/31/2021 1013 Gross per 24 hour  Intake 0 ml  Output --  Net 0 ml   Filed Weights   05/27/21 2353  Weight: 122.5 kg    Examination: Calm, NAD CTA no wheeze rales rhonchi's Regular S1-S2 no gallops Soft benign positive bowel sounds No edema aaxox4   Data Reviewed: I have personally reviewed following labs and imaging studies  CBC: Recent Labs  Lab 05/28/21 0627 05/29/21 0326  WBC 9.4 6.6  NEUTROABS 6.8  --   HGB 11.4* 10.5*  HCT 35.6* 33.1*  MCV 83.6 84.9  PLT 330 282   Basic Metabolic Panel: Recent Labs  Lab 05/28/21 0627 05/29/21 0326  NA 137 137  K 3.7 3.7  CL 103 106  CO2 27 27  GLUCOSE  111* 96  BUN 12 14  CREATININE 0.65 0.74  CALCIUM 8.3* 8.1*   GFR: Estimated Creatinine Clearance: 131.1 mL/min (by C-G formula based on SCr of 0.74 mg/dL). Liver Function Tests: No results for input(s): AST, ALT, ALKPHOS, BILITOT, PROT, ALBUMIN in the last 168 hours. No results for input(s): LIPASE, AMYLASE in the last 168 hours. No results for input(s): AMMONIA in the last 168 hours. Coagulation Profile: Recent Labs  Lab 05/29/21 0326  INR 1.0   Cardiac Enzymes: No results for input(s): CKTOTAL, CKMB, CKMBINDEX, TROPONINI in the last 168 hours. BNP (last 3 results) No results for input(s): PROBNP in the last 8760 hours. HbA1C: No results for input(s): HGBA1C in the last 72 hours. CBG: No results for input(s): GLUCAP in the last 168 hours. Lipid Profile: No results for input(s): CHOL, HDL, LDLCALC, TRIG, CHOLHDL, LDLDIRECT in the last 72 hours. Thyroid Function Tests: No results for input(s): TSH, T4TOTAL, FREET4, T3FREE, THYROIDAB in the last 72 hours. Anemia Panel: No results for input(s): VITAMINB12, FOLATE, FERRITIN, TIBC, IRON, RETICCTPCT in the last 72 hours. Sepsis Labs: No results for input(s): PROCALCITON, LATICACIDVEN in the last 168 hours.  Recent Results (from the past 240 hour(s))  Resp Panel by RT-PCR (Flu A&B, Covid) Nasopharyngeal Swab     Status: None   Collection Time: 05/28/21  6:27 AM   Specimen: Nasopharyngeal Swab; Nasopharyngeal(NP) swabs in vial transport medium  Result Value Ref Range Status   SARS Coronavirus 2 by RT PCR NEGATIVE NEGATIVE Final    Comment: (NOTE) SARS-CoV-2 target nucleic acids are NOT DETECTED.  The SARS-CoV-2 RNA is generally detectable in upper respiratory specimens during the acute phase of infection. The lowest concentration of SARS-CoV-2 viral copies this assay can detect is 138 copies/mL. A negative result does not preclude SARS-Cov-2 infection and should not be used as the sole basis for treatment or other patient  management decisions. A negative result may occur with  improper specimen collection/handling, submission of specimen other than nasopharyngeal swab, presence of viral mutation(s) within the areas targeted by this assay, and inadequate number of viral copies(<138 copies/mL). A negative result must be combined with clinical observations, patient history, and epidemiological information. The expected result is Negative.  Fact Sheet for Patients:  BloggerCourse.com  Fact Sheet for Healthcare Providers:  SeriousBroker.it  This test is no t yet approved or cleared by the [place] FDA and  has been authorized for detection and/or diagnosis of SARS-CoV-2 by FDA under an Emergency Use Authorization (EUA). This EUA will remain  in effect (meaning this test can be used) for the duration of the COVID-19 declaration under Section 564(b)(1) of the Act, 21 U.S.C.section 360bbb-3(b)(1), unless the authorization is terminated  or revoked sooner.       Influenza A by PCR NEGATIVE  NEGATIVE Final   Influenza B by PCR NEGATIVE NEGATIVE Final    Comment: (NOTE) The Xpert Xpress SARS-CoV-2/FLU/RSV plus assay is intended as an aid in the diagnosis of influenza from Nasopharyngeal swab specimens and should not be used as a sole basis for treatment. Nasal washings and aspirates are unacceptable for Xpert Xpress SARS-CoV-2/FLU/RSV testing.  Fact Sheet for Patients: BloggerCourse.com  Fact Sheet for Healthcare Providers: SeriousBroker.it  This test is not yet approved or cleared by the [place] FDA and has been authorized for detection and/or diagnosis of SARS-CoV-2 by FDA under an Emergency Use Authorization (EUA). This EUA will remain in effect (meaning this test can be used) for the duration of the COVID-19 declaration under Section 564(b)(1) of the Act, 21 U.S.C. section 360bbb-3(b)(1),  unless the authorization is terminated or revoked.  Performed at [facility], [address] [place], [place] [address]          Radiology Studies: No results found.      Scheduled Meds:  acetaminophen  1,000 mg Oral TID   calcium carbonate  2,500 mg Oral Daily   docusate sodium  100 mg Oral BID   enoxaparin (LOVENOX) injection  0.5 mg/kg Subcutaneous Q24H   famotidine  20 mg Oral BID   FLUoxetine  40 mg Oral Daily   hydrochlorothiazide  12.5 mg Oral Daily   ketorolac  15 mg Intravenous Q6H   levothyroxine  125 mcg Oral QAC breakfast   pantoprazole  40 mg Oral Daily   senna  1 tablet Oral BID   Continuous Infusions:   LOS: 1 day    Time spent: 35 minutes with more than 50% on COC   Lynn Ito, MD [facility] Pager [phone]  If 7PM-7AM, please contact night-coverage 05/31/2021, 2:33 PM

## 2021-05-31 NOTE — Progress Notes (Signed)
Occupational Therapy Treatment Patient Details Name: Taylor [name] MRN: [id number] DOB: 24-May-1986 Today's Date: 05/31/2021    History of present illness Patient is a 35 year old female with bilateral knee injuries. Left knee evaluated initially with knee immobilizer recommeneded and discharge home from ED. While getting into her car, she twisted her right knee and came back for evaluation. Found to have Left knee with acute fractures of the lateral tibial plateau, tiny subchondral fracture of the lateral femoral condyle, complete proximal ACL tear, extracapsular edema. Right knee minimally displaced tibial plateau fracture to be treated  non-operatively with bracing.   OT comments  Ms Minogue was seen for OT treatment on this date. Upon arrival to room pt reclined in bed reporting 3/10 pain and agreeable to session. Pt aware of new Wbing orders since prior session and verbalizes accurately. Pt requires CGA for BSC t/f - cues to maintain WBing pcns and guard assist to stabilize BSC. Pt requires use of RLE leverage on hospital bed to complete t/f safely - would be unable to complete from standard home recliner.   MOD I doff gown and don dress seated EOB. MOD A + RW for bed>chair SPT - assist to maintain NWBing pcns and pt unable to sit safely to low chair height without assist 2/2 B knees locked in extension.   Pt making good progress toward goals. Pt continues to benefit from skilled OT services to maximize return to PLOF and minimize risk of future falls, injury, caregiver burden, and readmission. Will continue to follow POC. Discharge recommendation remains appropriate.    Follow Up Recommendations  Home health OT;Supervision/Assistance - 24 hour (if unable to have hospital bed or 24/7 assist pt is appropriate for SNF)    Equipment Recommendations  3 in 1 bedside commode;Hospital bed;Other (comment) (drop arm bariatric BSC)    Recommendations for Other Services      Precautions /  Restrictions Precautions Precautions: Fall Required Braces or Orthoses: Knee Immobilizer - Right;Knee Immobilizer - Left Knee Immobilizer - Right: On at all times Knee Immobilizer - Left: On at all times Restrictions Weight Bearing Restrictions: Yes RLE Weight Bearing: Weight bearing as tolerated LLE Weight Bearing: Touchdown weight bearing       Mobility Bed Mobility Overal bed mobility: Modified Independent             General bed mobility comments: Pt requires bed rails for mobility    Transfers Overall transfer level: Needs assistance Equipment used: Rolling walker (2 wheeled) Transfers: Lateral/Scoot Transfers;Stand Pivot Transfers   Stand pivot transfers: Mod assist;From elevated surface      Lateral/Scoot Transfers: Min guard General transfer comment: Pt requires CGA to stabilize BSC for lateral scoot t/f. MOD A to maintain NWBing pcns - unable to sit to low chair height    Balance Overall balance assessment: Needs assistance Sitting-balance support: No upper extremity supported;Feet supported Sitting balance-Leahy Scale: Normal     Standing balance support: Bilateral upper extremity supported Standing balance-Leahy Scale: Poor                             ADL either performed or assessed with clinical judgement   ADL Overall ADL's : Needs assistance/impaired                                       General ADL Comments: CGA for  BSC t/f - cues to maintain WBing pcns. MOD I doff gown and don dress seated EOB. MOD A for SPT      Cognition Arousal/Alertness: Awake/alert Behavior During Therapy: WFL for tasks assessed/performed Overall Cognitive Status: Within Functional Limits for tasks assessed                                          Exercises Exercises: Other exercises Other Exercises Other Exercises: Pt educated re: DME recs, d/c recs, falls prevention, pain mgmt, home/routines modifications Other  Exercises: LBD, sup<>sit, toileting, lateral scoot t/f, UBD, SPT           Pertinent Vitals/ Pain       Pain Assessment: 0-10 Pain Score: 3  Pain Location: L knee, R knee Pain Descriptors / Indicators: Discomfort;Aching;Sore Pain Intervention(s): Limited activity within patient's tolerance;Premedicated before session;Repositioned         Frequency  Min 2X/week        Progress Toward Goals  OT Goals(current goals can now be found in the care plan section)  Progress towards OT goals: Progressing toward goals  Acute Rehab OT Goals Patient Stated Goal: to go to rehab OT Goal Formulation: With patient Time For Goal Achievement: 06/12/21 Potential to Achieve Goals: Good ADL Goals Pt Will Perform Lower Body Dressing: with min assist;with caregiver independent in assisting;sitting/lateral leans Pt Will Transfer to Toilet: with modified independence;with transfer board;bedside commode Pt Will Perform Toileting - Clothing Manipulation and hygiene: with modified independence;sitting/lateral leans  Plan Discharge plan remains appropriate;Frequency remains appropriate       AM-PAC OT "6 Clicks" Daily Activity     Outcome Measure   Help from another person eating meals?: None Help from another person taking care of personal grooming?: None Help from another person toileting, which includes using toliet, bedpan, or urinal?: A Little Help from another person bathing (including washing, rinsing, drying)?: A Little Help from another person to put on and taking off regular upper body clothing?: None Help from another person to put on and taking off regular lower body clothing?: A Little 6 Click Score: 21    End of Session Equipment Utilized During Treatment: Rolling walker  OT Visit Diagnosis: Other abnormalities of gait and mobility (R26.89)   Activity Tolerance Patient tolerated treatment well   Patient Left in chair;with call bell/phone within reach   Nurse Communication  Mobility status        Time: 6962-9528 OT Time Calculation (min): 27 min  Charges: OT General Charges $OT Visit: 1 Visit OT Treatments $Self Care/Home Management : 23-37 mins  Kathie Dike, M.S. OTR/L  05/31/21, 1:41 PM  ascom [phone]

## 2021-06-01 DIAGNOSIS — S82044D Nondisplaced comminuted fracture of right patella, subsequent encounter for closed fracture with routine healing: Secondary | ICD-10-CM | POA: Diagnosis not present

## 2021-06-01 DIAGNOSIS — I10 Essential (primary) hypertension: Secondary | ICD-10-CM | POA: Diagnosis not present

## 2021-06-01 LAB — CBC
HCT: 31 % — ABNORMAL LOW (ref 36.0–46.0)
Hemoglobin: 9.6 g/dL — ABNORMAL LOW (ref 12.0–15.0)
MCH: 26.4 pg (ref 26.0–34.0)
MCHC: 31 g/dL (ref 30.0–36.0)
MCV: 85.2 fL (ref 80.0–100.0)
Platelets: 330 10*3/uL (ref 150–400)
RBC: 3.64 MIL/uL — ABNORMAL LOW (ref 3.87–5.11)
RDW: 14.2 % (ref 11.5–15.5)
WBC: 6.6 10*3/uL (ref 4.0–10.5)
nRBC: 0 % (ref 0.0–0.2)

## 2021-06-01 NOTE — Progress Notes (Addendum)
Physical Therapy Treatment Patient Details Name: Taylor [name] MRN: [id number] DOB: Aug 28, 1986 Today's Date: 06/01/2021    History of Present Illness Patient is a 35 year old female with bilateral knee injuries. Left knee evaluated initially with knee immobilizer recommeneded and discharge home from ED. While getting into her car, she twisted her right knee and came back for evaluation. Found to have Left knee with acute fractures of the lateral tibial plateau, tiny subchondral fracture of the lateral femoral condyle, complete proximal ACL tear, extracapsular edema. Right knee minimally displaced tibial plateau fracture to be treated  non-operatively with bracing.    PT Comments    Pt was long sitting in bed awake and oriented. She agrees to PT session and is cooperative and motivated throughout. Noted change in restrictions from MD previous evening. Pt was able to exit bed, stand to RW and ambulate short distances with RW while adhering to wt bearing restrictions. Discussed obstacles for DC. Pt and author both feel she can safely manage at home at DC. Recommend HHPT to follow.   Follow Up Recommendations  Home health PT;Supervision for mobility/OOB     Equipment Recommendations  Wheelchair (measurements PT);3in1 (PT);Rolling walker with 5" wheels;Hospital bed (pt requesting hospital bed)       Precautions / Restrictions Precautions Precautions: Fall Required Braces or Orthoses: Knee Immobilizer - Left (ROM knee brace LLE unlocked if pt able to tolerate) Knee Immobilizer - Right: Other (comment) (discontinued 8/18) Knee Immobilizer - Left: On at all times;Other (comment) (able to be unlocked if pt able to tolerate) Restrictions Weight Bearing Restrictions: Yes RLE Weight Bearing: Weight bearing as tolerated LLE Weight Bearing: Touchdown weight bearing    Mobility  Bed Mobility Overal bed mobility: Modified Independent     Transfers Overall transfer level: Needs  assistance Equipment used: Rolling walker (2 wheeled) Transfers: Sit to/from Stand Sit to Stand: From elevated surface;Supervision         General transfer comment: Pt was able to stand from EOB and from lower recliner surface without physical assistance.  Ambulation/Gait Ambulation/Gait assistance: Supervision Gait Distance (Feet): 25 Feet Assistive device: Rolling walker (2 wheeled) Gait Pattern/deviations:  (" Hop to." TDWB LLE) Gait velocity: decrease   General Gait Details: pt was able to adhere to proper wt bearing with vcs. no physical assistance required to ambulate to doorway of room. Lengthy discussion about limiting gait distances to allow proper healing. per MD, do not push long ambulation distances.    Balance Overall balance assessment: Needs assistance Sitting-balance support: No upper extremity supported;Feet supported Sitting balance-Leahy Scale: Normal     Standing balance support: Bilateral upper extremity supported Standing balance-Leahy Scale: Good Standing balance comment: Pt requires BUE to stand. no LOB in standing with UE support throughout session      Cognition Arousal/Alertness: Awake/alert Behavior During Therapy: WFL for tasks assessed/performed Overall Cognitive Status: Within Functional Limits for tasks assessed            Pertinent Vitals/Pain Pain Assessment: 0-10 Pain Score: 7  Pain Location: L knee, R knee Pain Descriptors / Indicators: Discomfort;Aching;Sore Pain Intervention(s): Limited activity within patient's tolerance;Premedicated before session;Monitored during session;Repositioned;Ice applied     PT Goals (current goals can now be found in the care plan section) Acute Rehab PT Goals Patient Stated Goal: to go to rehab Progress towards PT goals: Progressing toward goals    Frequency    7X/week      PT Plan Discharge plan needs to be updated  Co-evaluation     PT goals addressed during session:  Balance;Mobility/safety with mobility;Proper use of DME;Strengthening/ROM        AM-PAC PT "6 Clicks" Mobility   Outcome Measure  Help needed turning from your back to your side while in a flat bed without using bedrails?: A Little Help needed moving from lying on your back to sitting on the side of a flat bed without using bedrails?: A Little Help needed moving to and from a bed to a chair (including a wheelchair)?: A Little Help needed standing up from a chair using your arms (e.g., wheelchair or bedside chair)?: A Little Help needed to walk in hospital room?: A Little Help needed climbing 3-5 steps with a railing? : Total 6 Click Score: 16    End of Session Equipment Utilized During Treatment: Other (comment) (ROM knee brace on LLE/ unlock per MD per pt tolerance) Activity Tolerance: Patient tolerated treatment well Patient left: in bed;with call bell/phone within reach;with bed alarm set;with family/visitor present Nurse Communication: Mobility status PT Visit Diagnosis: Unsteadiness on feet (R26.81);Other abnormalities of gait and mobility (R26.89) Pain - Right/Left: Left     Time: 2878-6767 PT Time Calculation (min) (ACUTE ONLY): 35 min  Charges:  $Gait Training: 8-22 mins $Therapeutic Activity: 8-22 mins                     Jetta Lout PTA 06/01/21, 11:14 AM

## 2021-06-01 NOTE — Progress Notes (Signed)
Occupational Therapy Treatment Patient Details Name: Taylor [name] MRN: [id number] DOB: Jan 17, 1986 Today's Date: 06/01/2021    History of present illness Patient is a 35 year old female with bilateral knee injuries. Left knee evaluated initially with knee immobilizer recommeneded and discharge home from ED. While getting into her car, she twisted her right knee and came back for evaluation. Found to have Left knee with acute fractures of the lateral tibial plateau, tiny subchondral fracture of the lateral femoral condyle, complete proximal ACL tear, extracapsular edema. Right knee minimally displaced tibial plateau fracture to be treated  non-operatively with bracing.   OT comments  Ms Self was seen for OT treatment on this date. Pt accurately states new updated Wbing restrictions and reports eager for planned d/c home next date. Pt instructed on DME setup upon d/c home to minimize falls risk. Pt return demonstrates bed level exercises of glute squeezes to maintain strength for functional mobility. Pt denies concerns for return home and reports plan to continue ice bags for home pain mgmt. Pt making good progress toward goals. Pt continues to benefit from skilled OT services to maximize return to PLOF and minimize risk of future falls, injury, caregiver burden, and readmission. Will continue to follow POC. Discharge recommendation remains appropriate.    Follow Up Recommendations  Home health OT;Supervision/Assistance - 24 hour    Equipment Recommendations  3 in 1 bedside commode;Hospital bed    Recommendations for Other Services      Precautions / Restrictions Precautions Precautions: Fall Required Braces or Orthoses: Knee Immobilizer - Left Restrictions Weight Bearing Restrictions: Yes RLE Weight Bearing: Weight bearing as tolerated LLE Weight Bearing: Touchdown weight bearing       Mobility Bed Mobility Overal bed mobility: Modified Independent                                        ADL either performed or assessed with clinical judgement   ADL Overall ADL's : Needs assistance/impaired                                       General ADL Comments: Instructed on DME recs, HEP, and pain mgmt str[employer]      Cognition Arousal/Alertness: Awake/alert Behavior During Therapy: WFL for tasks assessed/performed Overall Cognitive Status: Within Functional Limits for tasks assessed                                          Exercises Exercises: Other exercises Other Exercises Other Exercises: Pt educated re: DME recs, falls prevention, pain mgmt, HEP, home/routines modifications    Pertinent Vitals/ Pain       Pain Assessment: No/denies pain         Frequency  Min 2X/week        Progress Toward Goals  OT Goals(current goals can now be found in the care plan section)  Progress towards OT goals: Progressing toward goals  Acute Rehab OT Goals Patient Stated Goal: to go home OT Goal Formulation: With patient Time For Goal Achievement: 06/12/21 Potential to Achieve Goals: Good ADL Goals Pt Will Perform Lower Body Dressing: with min assist;with caregiver independent in assisting;sitting/lateral leans Pt Will Transfer to Toilet: with modified independence;with transfer  board;bedside commode Pt Will Perform Toileting - Clothing Manipulation and hygiene: with modified independence;sitting/lateral leans  Plan Discharge plan remains appropriate;Frequency remains appropriate    Co-evaluation                 AM-PAC OT "6 Clicks" Daily Activity     Outcome Measure   Help from another person eating meals?: None Help from another person taking care of personal grooming?: None Help from another person toileting, which includes using toliet, bedpan, or urinal?: A Little Help from another person bathing (including washing, rinsing, drying)?: A Little Help from another person to put on and taking off  regular upper body clothing?: None Help from another person to put on and taking off regular lower body clothing?: A Little 6 Click Score: 21    End of Session    OT Visit Diagnosis: Other abnormalities of gait and mobility (R26.89)   Activity Tolerance Patient tolerated treatment well   Patient Left in bed;with call bell/phone within reach   Nurse Communication          Time: 0981-1914 OT Time Calculation (min): 12 min  Charges: OT General Charges $OT Visit: 1 Visit OT Treatments $Self Care/Home Management : 8-22 mins  Kathie Dike, M.S. OTR/L  06/01/21, 3:47 PM  ascom [phone]

## 2021-06-01 NOTE — TOC Progression Note (Signed)
Transition of Care [facility]) - Progression Note    Patient Details  Name: Taylor [name] MRN: [id number] Date of Birth: Sep 24, 1986  Transition of [facility]) CM/SW Contact  Barrie Dunker, RN Phone Number: 06/01/2021, 10:10 AM  Clinical Narrative:     The patient has decided to not go to SNF but to go home, wants a hospital bed a bariatric walker and a bariatric wheelchair, notified [employer]       Expected Discharge Plan and Services                                                 Social Determinants of Health (SDOH) Interventions    Readmission Risk Interventions No flowsheet data found.

## 2021-06-01 NOTE — TOC Progression Note (Addendum)
Transition of Care [facility]) - Progression Note    Patient Details  Name: Taylor [name] MRN: [id number] Date of Birth: Nov 01, 1985  Transition of Care [facility]) CM/SW Contact  Barrie Dunker, RN Phone Number: 06/01/2021, 10:26 AM  Clinical Narrative:   Spoke with Zack from [employer], they will set up a Wheelchair to be delivered to the room to be able to do wheelchair transport to home, they are getting a ramp built at their house, she will get a hospital bed delivered to the home, she will get a bariatric 3 in 1 as well.   She will get the Rolling walker on her own, she stated that she can not DC home today because noone is at home to get her in the house or to get the bed delivered, she will plan to DC tomorrow, she will need [facility] wheelchair transport set up         Expected Discharge Plan and Services                                                 Social Determinants of Health (SDOH) Interventions    Readmission Risk Interventions No flowsheet data found.

## 2021-06-01 NOTE — Progress Notes (Cosign Needed Addendum)
Patient has Patellar fracture, Right Tibial Plateau fracture, Left ACL complete tear which requires lower body to be positioned in ways not feasible with a normal bed.  Pain require frequent requires  changes in body position which cannot be achieved with a normal bed.

## 2021-06-01 NOTE — Progress Notes (Signed)
PROGRESS NOTE    Taylor [name]  IRC:[id number] DOB: January 27, 1986 DOA: 05/28/2021 PCP: Philemon Kingdom, MD   Brief Narrative:   35 y.o. female with medical history significant of anxiety, hypertension, depression, GERD, hypothyroid who presents to the ED after initially being evaluated for pain in the left knee on 8/14.  No fracture was noted at that time and as the patient was discharged in the ED she was referred to orthopedic surgery and given follow-up instructions.  She states that when she was getting into her car she twisted her right knee and injured that as well.  Patient now had pain in the anterior aspect of right knee.  Presented back to the emergency room and imaging of the right knee revealed tibial plateau fracture and right patellar fracture.  Orthopedics was consulted and recommended nonoperative management.  MRI left knee pursued.  Demonstrates total ACL tear.  Orthopedics aware.  Brace has been placed on patient.  Weightbearing as tolerated left lower extremity.  Therapy evaluations pending.  8/17- c/o 10/10 pain and that po pain med not working at current dose 8/18-pain controlled. Snf pending 8/19-issues pain control.  Patient has decided to go home with home health and equipment  Assessment & Plan:   Active Problems:   Right medial tibial plateau fracture   Patellar fracture  Right medial tibial plateau fracture Right patellar fracture Left knee ACL tear Patient initially presented after left knee injury On discharge from ED twisted right knee and presented back Right knee with radiographic evidence of fracture Orthopedics consulted with nonoperative management 8/17 8/17 patient asking for second opinion requesting to see Dr. Rosita Kea.  I relayed the message to Dr. Rosita Kea who will see the patient Will increase oxycodone to 10 mg as 5 is not controlling her pain Continue therapy 8/19 Home with home health.  Awaiting for equipment to be delivered.  Also no one is at  home to help patient and as she is unable to walk.  Likely will discharge tomorrow Pain better controlled Will like to follow-up with Dr. Rosita Kea as outpatient    Essential hypertension Continue HCTZ and IV hydralazine as needed       Hypothyroidism Continue Synthroid   Depression/anxiety Continue Prozac and Xanax     GERD PPI   Obesity BMI 43.58 This complicates overall care and prognosis   DVT prophylaxis: SQ Lovenox Code Status: Full Family Communication: None at bedside   Disposition Plan: Status is: Inpatient  The patient will require care spanning > 2 midnights and should be moved to inpatient because: Ongoing active pain requiring inpatient pain management and Inpatient level of care appropriate due to severity of illness  Dispo: The patient is from: Home              Anticipated d/c is to: Home with home health              Patient currently is not medically stable to d/c.   Difficult to place patient No       Level of care: Med-Surg  Consultants:  Orthopedics  Procedures:  None  Antimicrobials:  None   Subjective: No pain today.  No shortness of breath or chest pain.  Objective: Vitals:   05/31/21 2228 06/01/21 0540 06/01/21 0808 06/01/21 1152  BP: 129/74 137/77 127/82 (!) 142/81  Pulse: 70 (!) 57 67 86  Resp: 17 17 16 16   Temp: 97.7 F (36.5 C) (!) 97.4 F (36.3 C) 97.8 F (36.6 C) 98 F (36.7  C)  TempSrc:      SpO2: 99% 94% 100% 98%  Weight:      Height:       No intake or output data in the 24 hours ending 06/01/21 1435  Filed Weights   05/27/21 2353  Weight: 122.5 kg    Examination: NAD, calm CTA no wheeze rales rhonchi's Regular S1-S2 no gallops Soft benign positive bowel sounds In boots [id number]    Data Reviewed: I have personally reviewed following labs and imaging studies  CBC: Recent Labs  Lab 05/28/21 0627 05/29/21 0326 06/01/21 0403  WBC 9.4 6.6 6.6  NEUTROABS 6.8  --   --   HGB 11.4* 10.5* 9.6*   HCT 35.6* 33.1* 31.0*  MCV 83.6 84.9 85.2  PLT 330 282 330   Basic Metabolic Panel: Recent Labs  Lab 05/28/21 0627 05/29/21 0326  NA 137 137  K 3.7 3.7  CL 103 106  CO2 27 27  GLUCOSE 111* 96  BUN 12 14  CREATININE 0.65 0.74  CALCIUM 8.3* 8.1*   GFR: Estimated Creatinine Clearance: 131.1 mL/min (by C-G formula based on SCr of 0.74 mg/dL). Liver Function Tests: No results for input(s): AST, ALT, ALKPHOS, BILITOT, PROT, ALBUMIN in the last 168 hours. No results for input(s): LIPASE, AMYLASE in the last 168 hours. No results for input(s): AMMONIA in the last 168 hours. Coagulation Profile: Recent Labs  Lab 05/29/21 0326  INR 1.0   Cardiac Enzymes: No results for input(s): CKTOTAL, CKMB, CKMBINDEX, TROPONINI in the last 168 hours. BNP (last 3 results) No results for input(s): PROBNP in the last 8760 hours. HbA1C: No results for input(s): HGBA1C in the last 72 hours. CBG: No results for input(s): GLUCAP in the last 168 hours. Lipid Profile: No results for input(s): CHOL, HDL, LDLCALC, TRIG, CHOLHDL, LDLDIRECT in the last 72 hours. Thyroid Function Tests: No results for input(s): TSH, T4TOTAL, FREET4, T3FREE, THYROIDAB in the last 72 hours. Anemia Panel: No results for input(s): VITAMINB12, FOLATE, FERRITIN, TIBC, IRON, RETICCTPCT in the last 72 hours. Sepsis Labs: No results for input(s): PROCALCITON, LATICACIDVEN in the last 168 hours.  Recent Results (from the past 240 hour(s))  Resp Panel by RT-PCR (Flu A&B, Covid) Nasopharyngeal Swab     Status: None   Collection Time: 05/28/21  6:27 AM   Specimen: Nasopharyngeal Swab; Nasopharyngeal(NP) swabs in vial transport medium  Result Value Ref Range Status   SARS Coronavirus 2 by RT PCR NEGATIVE NEGATIVE Final    Comment: (NOTE) SARS-CoV-2 target nucleic acids are NOT DETECTED.  The SARS-CoV-2 RNA is generally detectable in upper respiratory specimens during the acute phase of infection. The lowest concentration of  SARS-CoV-2 viral copies this assay can detect is 138 copies/mL. A negative result does not preclude SARS-Cov-2 infection and should not be used as the sole basis for treatment or other patient management decisions. A negative result may occur with  improper specimen collection/handling, submission of specimen other than nasopharyngeal swab, presence of viral mutation(s) within the areas targeted by this assay, and inadequate number of viral copies(<138 copies/mL). A negative result must be combined with clinical observations, patient history, and epidemiological information. The expected result is Negative.  Fact Sheet for Patients:  BloggerCourse.com  Fact Sheet for Healthcare Providers:  SeriousBroker.it  This test is no t yet approved or cleared by the [place] FDA and  has been authorized for detection and/or diagnosis of SARS-CoV-2 by FDA under an Emergency Use Authorization (EUA). This EUA will remain  in  effect (meaning this test can be used) for the duration of the COVID-19 declaration under Section 564(b)(1) of the Act, 21 U.S.C.section 360bbb-3(b)(1), unless the authorization is terminated  or revoked sooner.       Influenza A by PCR NEGATIVE NEGATIVE Final   Influenza B by PCR NEGATIVE NEGATIVE Final    Comment: (NOTE) The Xpert Xpress SARS-CoV-2/FLU/RSV plus assay is intended as an aid in the diagnosis of influenza from Nasopharyngeal swab specimens and should not be used as a sole basis for treatment. Nasal washings and aspirates are unacceptable for Xpert Xpress SARS-CoV-2/FLU/RSV testing.  Fact Sheet for Patients: BloggerCourse.com  Fact Sheet for Healthcare Providers: SeriousBroker.it  This test is not yet approved or cleared by the [place] FDA and has been authorized for detection and/or diagnosis of SARS-CoV-2 by FDA under an Emergency Use  Authorization (EUA). This EUA will remain in effect (meaning this test can be used) for the duration of the COVID-19 declaration under Section 564(b)(1) of the Act, 21 U.S.C. section 360bbb-3(b)(1), unless the authorization is terminated or revoked.  Performed at [facility], [address], [place], [place] [address]          Radiology Studies: No results found.      Scheduled Meds:  acetaminophen  1,000 mg Oral TID   calcium carbonate  2,500 mg Oral Daily   docusate sodium  100 mg Oral BID   enoxaparin (LOVENOX) injection  0.5 mg/kg Subcutaneous Q24H   famotidine  20 mg Oral BID   FLUoxetine  40 mg Oral Daily   hydrochlorothiazide  12.5 mg Oral Daily   ketorolac  15 mg Intravenous Q6H   levothyroxine  125 mcg Oral QAC breakfast   pantoprazole  40 mg Oral Daily   senna  1 tablet Oral BID   Continuous Infusions:   LOS: 2 days    Time spent: 35 minutes with more than 50% on COC   Lynn Ito, MD [facility] Pager [phone]  If 7PM-7AM, please contact night-coverage 06/01/2021, 2:35 PM

## 2021-06-02 DIAGNOSIS — S82131D Displaced fracture of medial condyle of right tibia, subsequent encounter for closed fracture with routine healing: Secondary | ICD-10-CM | POA: Diagnosis not present

## 2021-06-02 DIAGNOSIS — S82044D Nondisplaced comminuted fracture of right patella, subsequent encounter for closed fracture with routine healing: Secondary | ICD-10-CM | POA: Diagnosis not present

## 2021-06-02 DIAGNOSIS — I10 Essential (primary) hypertension: Secondary | ICD-10-CM | POA: Diagnosis not present

## 2021-06-02 MED ORDER — METHOCARBAMOL 750 MG PO TABS: 750.0000 mg | ORAL_TABLET | Freq: Three times a day (TID) | ORAL | 0 refills | Status: AC | PRN

## 2021-06-02 MED ORDER — OXYCODONE HCL 10 MG PO TABS: 10.0000 mg | ORAL_TABLET | ORAL | 0 refills | Status: AC | PRN

## 2021-06-02 NOTE — Progress Notes (Signed)
Physical Therapy Treatment Patient Details Name: Taylor [name] MRN: [id number] DOB: 13-Aug-1986 Today's Date: 06/02/2021    History of Present Illness Patient is a 35 year old female with bilateral knee injuries. Left knee evaluated initially with knee immobilizer recommeneded and discharge home from ED. While getting into her car, she twisted her right knee and came back for evaluation. Found to have Left knee with acute fractures of the lateral tibial plateau, tiny subchondral fracture of the lateral femoral condyle, complete proximal ACL tear, extracapsular edema. Right knee minimally displaced tibial plateau fracture to be treated  non-operatively with bracing.    PT Comments    Pt was long sitting in bed upon arriving. She is A and O x 4 and extremely cooperative and pleasant. Eager to go home. Has had all equipment delivered to room and home. She demonstrates great insight of wt bearing restrictions and understands MD does not want her ambulating long distance. Most of session educating on expectations going forward and how to use equipment properly. She is cleared form a PT standpoint for safe DC home with HHPT to follow to continue to address deficits until pt can have LLE surgery in future.   Follow Up Recommendations  Home health PT;Supervision for mobility/OOB     Equipment Recommendations  Other (comment) (pt has recieved all equipment and is ready for DC form PT standpoint)       Precautions / Restrictions Precautions Precautions: Fall Required Braces or Orthoses: Knee Immobilizer - Left Knee Immobilizer - Right: Other (comment) (discontinued on 8/18) Knee Immobilizer - Left: On at all times;Other (comment) (alowing to be unlocked as long as pt tolerates well and is safe) Restrictions Weight Bearing Restrictions: Yes RLE Weight Bearing: Weight bearing as tolerated LLE Weight Bearing: Touchdown weight bearing    Mobility  Bed Mobility Overal bed mobility: Modified  Independent     Transfers Overall transfer level: Needs assistance Equipment used: Rolling walker (2 wheeled) Transfers: Sit to/from Stand Sit to Stand: From elevated surface;Supervision Stand pivot transfers: Supervision;From elevated surface       General transfer comment: no safe concerns with transfers  Ambulation/Gait    General Gait Details: elected not to ambulate today. session focused on w/c equipment management and education on expectations going forward. pt is moving well now that MD is allowing WBAT RLE and LLE knee brace unlocked. does well maintaining TDWB. Pt recommended not to ambulate long distances.    Merchant [employer] officer mobility: Yes Wheelchair propulsion: Both upper extremities Wheelchair parts: Psychologist, sport and exercise Details (indicate cue type and reason): therapist demonstrated proper application of WC parts and management of leg rest and brakes. she states confidence in abilities    Balance Overall balance assessment: Needs assistance Sitting-balance support: No upper extremity supported;Feet supported Sitting balance-Leahy Scale: Normal     Standing balance support: Bilateral upper extremity supported Standing balance-Leahy Scale: Good Standing balance comment: Pt requires BUE to stand. no LOB in standing with UE support throughout session      Cognition Arousal/Alertness: Awake/alert Behavior During Therapy: WFL for tasks assessed/performed Overall Cognitive Status: Within Functional Limits for tasks assessed      General Comments: Pt is A and O x 4. has WC and BSC in room. endorses hospital bed was delivered yesterday. CM working on [facility] w/c transport and HH services             Pertinent Vitals/Pain Pain Assessment: No/denies pain     PT Goals (current goals can  now be found in the care plan section) Acute Rehab PT Goals Patient Stated Goal: to go home Progress towards PT goals:  Progressing toward goals    Frequency    7X/week      PT Plan Current plan remains appropriate    Co-evaluation     PT goals addressed during session: Mobility/safety with mobility;Balance;Proper use of DME;Strengthening/ROM        AM-PAC PT "6 Clicks" Mobility   Outcome Measure  Help needed turning from your back to your side while in a flat bed without using bedrails?: A Little Help needed moving from lying on your back to sitting on the side of a flat bed without using bedrails?: A Little Help needed moving to and from a bed to a chair (including a wheelchair)?: A Little Help needed standing up from a chair using your arms (e.g., wheelchair or bedside chair)?: A Little Help needed to walk in hospital room?: A Little Help needed climbing 3-5 steps with a railing? : Total 6 Click Score: 16    End of Session   Activity Tolerance: Patient tolerated treatment well Patient left: in bed;with call bell/phone within reach;with bed alarm set;with family/visitor present Nurse Communication: Mobility status PT Visit Diagnosis: Unsteadiness on feet (R26.81);Other abnormalities of gait and mobility (R26.89) Pain - Right/Left: Left     Time: 0950-1010 PT Time Calculation (min) (ACUTE ONLY): 20 min  Charges:  $Therapeutic Activity: 8-22 mins                     Jetta Lout PTA 06/02/21, 10:59 AM

## 2021-06-02 NOTE — Plan of Care (Signed)
Patient discharged home with Home Health via W/C and [facility] transport van. Personal belongings and equipment with patient. Reviewed AVS prior to D/C.  Problem: Education: Goal: Knowledge of General Education information will improve Description: Including pain rating scale, medication(s)/side effects and non-pharmacologic comfort measures Outcome: Adequate for Discharge

## 2021-06-02 NOTE — Discharge Summary (Addendum)
Taylor [name] FAO:[id number] DOB: [birth year] DOA: 05/28/2021  PCP: Philemon Kingdom, MD  Admit date: 05/28/2021 Discharge date: 06/02/2021  Admitted From: Home Disposition:  Home  Recommendations for Outpatient Follow-up:  Follow up with PCP in 1 week Please obtain BMP/CBC in one week Please follow up Dr. Rosita Kea as Scheduled.  follow up with PCP for Outpatient therapy when ready     Discharge Condition:Stable CODE STATUS: Full Diet recommendation: Heart Healthy  Brief/Interim Summary: Per ONG:EXBMWUX Taylor [name] is Taylor 35 y.o. female with medical history significant of anxiety, hypertension, depression, GERD, hypothyroid who presents to the ED after initially being evaluated for pain in the left knee on 8/14.  No fracture was noted at that time and as the patient was discharged in the ED she was referred to orthopedic surgery and given follow-up instructions.  She states that when she was getting into her car she twisted her right knee and injured that as well.  Patient now had pain in the anterior aspect of right knee.  Presented back to the emergency room and imaging of the right knee revealed tibial plateau fracture and right patellar fracture.  Orthopedics was consulted and recommended nonoperative management.     ED Course: On presentation imaging survey as above.  Orthopedics consulted.  Nonoperative management.  Pain control was provided.  Therapy evaluations requested.     Right medial tibial plateau fracture Right patellar fracture Left knee ACL tear Patient initially presented after left knee injury On discharge from ED twisted right knee and presented back Right knee with radiographic evidence of fracture Orthopedics consulted with nonoperative management 8/17 patient initially was seen by Dr. Odis Luster and wanted Taylor second opinion with Dr. Rosita Kea.   Pain was managed  PT OT recommending SNF  Per Dr. Menz-discontinuing the knee immobilizer with weightbearing as tolerated on the  right.  Additionally on the left she can have this out of large extension.  If she walks without the knee being locked and feels and unstable she can have the knee locked in full extension on the left this may aid in her ability to ambulate    Essential hypertension Continue HCTZ            Hypothyroidism Continue Synthroid   Depression/anxiety Continue home meds       GERD PPI   Obesity BMI 43.58 This complicates overall care and prognosis        Discharge Diagnoses:  Active Problems:   Right medial tibial plateau fracture   Patellar fracture    Discharge Instructions  Discharge Instructions     Call MD for:  severe uncontrolled pain   Complete by: As directed    Diet - low sodium heart healthy   Complete by: As directed    Increase activity slowly   Complete by: As directed       Allergies as of 06/02/2021       Reactions   Keflex [cephalexin] Anaphylaxis        Medication List     STOP taking these medications    HYDROcodone-acetaminophen 5-325 MG tablet Commonly known as: Norco   Molnupiravir 200 MG Caps   ondansetron 4 MG disintegrating tablet Commonly known as: Zofran ODT       TAKE these medications    ALPRAZolam 0.25 MG tablet Commonly known as: XANAX Take 0.25 mg by mouth 2 (two) times daily as needed for anxiety.   aspirin-acetaminophen-caffeine 250-250-65 MG tablet Commonly known as: EXCEDRIN MIGRAINE Take 2 tablets by  mouth daily as needed for headache.   Bariatric Fusion Chew Chew 1 tablet by mouth 3 (three) times daily.   calcium carbonate 1250 (500 Ca) MG chewable tablet Commonly known as: OS-CAL Chew 2 tablets by mouth daily.   ergocalciferol 1.25 MG (50000 UT) capsule Commonly known as: VITAMIN D2 take 1 capsule by mouth Once Taylor week   famotidine 20 MG tablet Commonly known as: PEPCID TAKE 1 TABLET BY MOUTH FOR BREAKTHROUGH HEARTBURN TWICE Taylor DAILY AS NEEDED   FLUoxetine 40 MG capsule Commonly known as:  PROZAC Take 40 mg by mouth daily.   hydrochlorothiazide 12.5 MG capsule Commonly known as: MICROZIDE Take 12.5 mg by mouth daily.   levonorgestrel 20 MCG/24HR IUD Commonly known as: MIRENA 1 each by Intrauterine route once.   levothyroxine 125 MCG tablet Commonly known as: SYNTHROID Take 125 mcg by mouth daily before breakfast.   methocarbamol 750 MG tablet Commonly known as: ROBAXIN Take 1 tablet (750 mg total) by mouth every 8 (eight) hours as needed for up to 14 days for muscle spasms.   Oxycodone HCl 10 MG Tabs Take 1 tablet (10 mg total) by mouth every 4 (four) hours as needed for up to 3 days for moderate pain.   pantoprazole 40 MG tablet Commonly known as: PROTONIX TAKE 1 TABLET BY MOUTH ONCE DAILY   Saxenda 18 MG/3ML Sopn Generic drug: Liraglutide -Weight Management INJECT 3 MG INTO THE SKIN ONCE Taylor DAY               Durable Medical Equipment  (From admission, onward)           Start     Ordered   06/02/21 0846  For home use only DME Bedside commode  Once       Comments: 3:1  Question:  Patient needs Taylor bedside commode to treat with the following condition  Answer:  Fracture   06/02/21 0845   06/01/21 1013  For home use only DME Hospital bed  Once       Question Answer Comment  Length of Need 12 Months   Patient has (list medical condition): Bilateral non weight bearing braces in lock position,  Patellar fracture, right medial tibial plateu fx, ACL complete tear   The above medical condition requires: Patient requires the ability to reposition frequently   Bed type Semi-electric   Support Surface: Gel Overlay      06/01/21 1016   06/01/21 1012  For home use only DME standard manual wheelchair with seat cushion  Once       Comments: Heavy Duty , Due to body habitus Taylor regular wheelchair will not work,  Needs bilateral articulating elevated leg rest, removable arm rest  Patient suffers from Bilateral knee injuries which impairs their ability to perform  daily activities like bathing, dressing, feeding, grooming, and toileting in the home.  Taylor cane, crutch, or walker will not resolve issue with performing activities of daily living. Taylor wheelchair will allow patient to safely perform daily activities. Patient can safely propel the wheelchair in the home or has Taylor caregiver who can provide assistance. Length of need 6 months . Accessories: elevating leg rests (ELRs), wheel locks, extensions and anti-tippers.   06/01/21 1012   06/01/21 1011  For home use only DME 3 n 1  Once       Comments: With drop arm function, bariatric, due to body habitus Taylor regular 3 in 1 will not work   06/01/21 1012  Contact information for follow-up providers     Philemon KingdomProchnau, Caroline, MD Follow up in 1 week(s).   Specialty: Internal Medicine Contact information: [address] [place] [address] [phone]         Kennedy BuckerMenz, Michael, MD Follow up in 1 week(s).   Specialty: Orthopedic Surgery Contact information: [address] [facility] [place] [address] [phone]              Contact information for after-discharge care     Destination     [facility] .   Service: Skilled Nursing Contact information: [address] [place] [id number] [phone]                    Allergies  Allergen Reactions   Keflex [Cephalexin] Anaphylaxis    Consultations: Orthopedics   Procedures/Studies: CT Knee Right Wo Contrast  Result Date: 05/28/2021 CLINICAL DATA:  Right knee pain after fall. EXAM: CT OF THE RIGHT KNEE WITHOUT CONTRAST TECHNIQUE: Multidetector CT imaging of the right knee was performed according to the standard protocol. Multiplanar CT image reconstructions were also generated. COMPARISON:  Right knee x-rays from same day. FINDINGS: Bones/Joint/Cartilage Acute mildly depressed fracture of the posterior lateral tibial plateau. Acute avulsion  fracture of the nonarticular lateral tibial plateau. Acute tiny avulsion fracture of the inferomedial patella. Lateral patellar subluxation. No dislocation. Joint spaces are preserved. Small to moderate lipohemarthrosis. Ligaments Ligaments are suboptimally evaluated by CT. Muscles and Tendons Grossly intact. Soft tissue No fluid collection or hematoma.  No soft tissue mass. IMPRESSION: 1. Acute fractures of the lateral tibial plateau as described above. 2. Acute tiny avulsion fracture of the inferomedial patella. 3. Small to moderate lipohemarthrosis. Electronically Signed   By: Obie DredgeWilliam T Derry M.D.   On: 05/28/2021 05:55   MR KNEE LEFT WO CONTRAST  Result Date: 05/28/2021 CLINICAL DATA:  Knee trauma, meniscal/ligament injury suspected, xray done (Age >= 1y) Tibial plateau fracture (Age >= 1y) EXAM: MRI OF THE LEFT KNEE WITHOUT CONTRAST TECHNIQUE: Multiplanar, multisequence MR imaging of the knee was performed. No intravenous contrast was administered. COMPARISON:  CT 05/28/2021, radiograph 05/28/2021 FINDINGS: MENISCI Medial: Intact. Lateral: Intact. LIGAMENTS Cruciates: Complete tear of the proximal ACL.  The PCL is intact. Collaterals: Medial collateral ligament is intact. The popliteus tendon, lateral collateral ligament, and biceps femoris are intact. There is extracapsular edema near the posterolateral corner. CARTILAGE Patellofemoral:  Mild chondrosis. Medial:  No chondral defect. Lateral:  No chondral defect. JOINT: Moderate-sized joint effusion/hemarthrosis. POPLITEAL FOSSA: Small Baker cyst. EXTENSOR MECHANISM: Intact quadriceps tendon. Intact patellar tendon. Intact lateral patellar retinaculum. Intact medial patellar retinaculum. Intact MPFL. BONES: Pivot shift bony contusion pattern in the lateral femoral condyle and posterolateral tibial plateau. Acute, mild depressed fracture of the posterolateral tibial plateau. Avulsion fracture of the non articular lateral tibial plateau. Tiny subchondral  fracture of the lateral femoral condyle (coronal T1 image 14). Other: No additional findings. IMPRESSION: Complete proximal ACL tear with pivot shift bony contusion pattern. Extracapsular edema along the posterolateral corner, likely related to adjacent posterolateral tibial plateau fracture, but can also be seen in the setting of Taylor minor ligamentous PLC injury. Intact popliteus tendon, lateral collateral ligament, and biceps femoris. Acute fractures of the lateral tibial plateau as described above, better assessed on recent CT. Tiny subchondral fracture of the lateral femoral condyle. Moderate-sized joint effusion/hemarthrosis. No evidence of meniscus tear. Electronically Signed   By: Caprice RenshawJacob  Kahn M.D.   On:  05/28/2021 14:33   DG Knee Complete 4 Views Left  Result Date: 05/27/2021 CLINICAL DATA:  Left knee pain following fall, initial encounter EXAM: LEFT KNEE - COMPLETE 4+ VIEW COMPARISON:  None. FINDINGS: No evidence of fracture, dislocation, or joint effusion. No evidence of arthropathy or other focal bone abnormality. Soft tissues are unremarkable. IMPRESSION: No acute abnormality noted. Electronically Signed   By: Alcide Clever M.D.   On: 05/27/2021 19:47   DG Knee Complete 4 Views Right  Result Date: 05/28/2021 CLINICAL DATA:  Fall EXAM: RIGHT KNEE - COMPLETE 4+ VIEW COMPARISON:  None. FINDINGS: Abnormal appearance of the lateral aspect of the right proximal tibia. No joint effusion. No dislocation. IMPRESSION: Abnormal appearance of the lateral aspect of the right proximal tibia, concerning for avulsion fracture. CT of the right knee suggestive Electronically Signed   By: Deatra Robinson M.D.   On: 05/28/2021 00:46      Subjective: Has no complaints of pain, shortness of breath or any other complaints  Discharge Exam: Vitals:   06/02/21 0738 06/02/21 1126  BP: (!) 143/76 127/74  Pulse: (!) 53 65  Resp: 17 17  Temp: 98.6 F (37 C) 98.5 F (36.9 C)  SpO2: 100% 100%   Vitals:   06/01/21  2322 06/02/21 0346 06/02/21 0738 06/02/21 1126  BP: 125/76 124/65 (!) 143/76 127/74  Pulse: (!) 56 (!) 58 (!) 53 65  Resp: 18 20 17 17   Temp: 97.9 F (36.6 C) 98 F (36.7 C) 98.6 F (37 C) 98.5 F (36.9 C)  TempSrc: Oral   Oral  SpO2: 100% 99% 100% 100%  Weight:      Height:        General: Pt is alert, awake, not in acute distress Cardiovascular: RRR, S1/S2 +, no rubs, no gallops Respiratory: CTA bilaterally, no wheezing, no rhonchi Abdominal: Soft, NT, ND, bowel sounds + Extremities:LLE in brace. No edema    The results of significant diagnostics from this hospitalization (including imaging, microbiology, ancillary and laboratory) are listed below for reference.     Microbiology: Recent Results (from the past 240 hour(s))  Resp Panel by RT-PCR (Flu Taylor&B, Covid) Nasopharyngeal Swab     Status: None   Collection Time: 05/28/21  6:27 AM   Specimen: Nasopharyngeal Swab; Nasopharyngeal(NP) swabs in vial transport medium  Result Value Ref Range Status   SARS Coronavirus 2 by RT PCR NEGATIVE NEGATIVE Final    Comment: (NOTE) SARS-CoV-2 target nucleic acids are NOT DETECTED.  The SARS-CoV-2 RNA is generally detectable in upper respiratory specimens during the acute phase of infection. The lowest concentration of SARS-CoV-2 viral copies this assay can detect is 138 copies/mL. Taylor negative result does not preclude SARS-Cov-2 infection and should not be used as the sole basis for treatment or other patient management decisions. Taylor negative result may occur with  improper specimen collection/handling, submission of specimen other than nasopharyngeal swab, presence of viral mutation(s) within the areas targeted by this assay, and inadequate number of viral copies(<138 copies/mL). Taylor negative result must be combined with clinical observations, patient history, and epidemiological information. The expected result is Negative.  Fact Sheet for Patients:   05/30/21  Fact Sheet for Healthcare Providers:  BloggerCourse.com  This test is no t yet approved or cleared by the [place] FDA and  has been authorized for detection and/or diagnosis of SARS-CoV-2 by FDA under an Emergency Use Authorization (EUA). This EUA will remain  in effect (meaning this test can be used) for the duration  of the COVID-19 declaration under Section 564(b)(1) of the Act, 21 U.S.C.section 360bbb-3(b)(1), unless the authorization is terminated  or revoked sooner.       Influenza Taylor by PCR NEGATIVE NEGATIVE Final   Influenza B by PCR NEGATIVE NEGATIVE Final    Comment: (NOTE) The Xpert Xpress SARS-CoV-2/FLU/RSV plus assay is intended as an aid in the diagnosis of influenza from Nasopharyngeal swab specimens and should not be used as Taylor sole basis for treatment. Nasal washings and aspirates are unacceptable for Xpert Xpress SARS-CoV-2/FLU/RSV testing.  Fact Sheet for Patients: BloggerCourse.com  Fact Sheet for Healthcare Providers: SeriousBroker.it  This test is not yet approved or cleared by the [place] FDA and has been authorized for detection and/or diagnosis of SARS-CoV-2 by FDA under an Emergency Use Authorization (EUA). This EUA will remain in effect (meaning this test can be used) for the duration of the COVID-19 declaration under Section 564(b)(1) of the Act, 21 U.S.C. section 360bbb-3(b)(1), unless the authorization is terminated or revoked.  Performed at [facility], [address] [place], [place] [address]      Labs: BNP (last 3 results) No results for input(s): BNP in the last 8760 hours. Basic Metabolic Panel: Recent Labs  Lab 05/28/21 0627 05/29/21 0326  NA 137 137  K 3.7 3.7  CL 103 106  CO2 27 27  GLUCOSE 111* 96  BUN 12 14  CREATININE 0.65 0.74  CALCIUM 8.3* 8.1*   Liver Function Tests: No  results for input(s): AST, ALT, ALKPHOS, BILITOT, PROT, ALBUMIN in the last 168 hours. No results for input(s): LIPASE, AMYLASE in the last 168 hours. No results for input(s): AMMONIA in the last 168 hours. CBC: Recent Labs  Lab 05/28/21 0627 05/29/21 0326 06/01/21 0403  WBC 9.4 6.6 6.6  NEUTROABS 6.8  --   --   HGB 11.4* 10.5* 9.6*  HCT 35.6* 33.1* 31.0*  MCV 83.6 84.9 85.2  PLT 330 282 330   Cardiac Enzymes: No results for input(s): CKTOTAL, CKMB, CKMBINDEX, TROPONINI in the last 168 hours. BNP: Invalid input(s): POCBNP CBG: No results for input(s): GLUCAP in the last 168 hours. D-Dimer No results for input(s): DDIMER in the last 72 hours. Hgb A1c No results for input(s): HGBA1C in the last 72 hours. Lipid Profile No results for input(s): CHOL, HDL, LDLCALC, TRIG, CHOLHDL, LDLDIRECT in the last 72 hours. Thyroid function studies No results for input(s): TSH, T4TOTAL, T3FREE, THYROIDAB in the last 72 hours.  Invalid input(s): FREET3 Anemia work up No results for input(s): VITAMINB12, FOLATE, FERRITIN, TIBC, IRON, RETICCTPCT in the last 72 hours. Urinalysis    Component Value Date/Time   COLORURINE YELLOW (Taylor) 05/28/2016 0744   APPEARANCEUR CLOUDY (Taylor) 05/28/2016 0744   APPEARANCEUR Hazy 08/25/2014 1408   LABSPEC 1.026 05/28/2016 0744   LABSPEC 1.011 08/25/2014 1408   PHURINE 5.0 05/28/2016 0744   GLUCOSEU NEGATIVE 05/28/2016 0744   GLUCOSEU Negative 08/25/2014 1408   HGBUR 2+ (Taylor) 05/28/2016 0744   BILIRUBINUR NEGATIVE 05/28/2016 0744   BILIRUBINUR Negative 08/25/2014 1408   KETONESUR NEGATIVE 05/28/2016 0744   PROTEINUR NEGATIVE 05/28/2016 0744   NITRITE POSITIVE (Taylor) 05/28/2016 0744   LEUKOCYTESUR TRACE (Taylor) 05/28/2016 0744   LEUKOCYTESUR Negative 08/25/2014 1408   Sepsis Labs Invalid input(s): PROCALCITONIN,  WBC,  LACTICIDVEN Microbiology Recent Results (from the past 240 hour(s))  Resp Panel by RT-PCR (Flu Taylor&B, Covid) Nasopharyngeal Swab     Status: None    Collection Time: 05/28/21  6:27 AM   Specimen:  Nasopharyngeal Swab; Nasopharyngeal(NP) swabs in vial transport medium  Result Value Ref Range Status   SARS Coronavirus 2 by RT PCR NEGATIVE NEGATIVE Final    Comment: (NOTE) SARS-CoV-2 target nucleic acids are NOT DETECTED.  The SARS-CoV-2 RNA is generally detectable in upper respiratory specimens during the acute phase of infection. The lowest concentration of SARS-CoV-2 viral copies this assay can detect is 138 copies/mL. Taylor negative result does not preclude SARS-Cov-2 infection and should not be used as the sole basis for treatment or other patient management decisions. Taylor negative result may occur with  improper specimen collection/handling, submission of specimen other than nasopharyngeal swab, presence of viral mutation(s) within the areas targeted by this assay, and inadequate number of viral copies(<138 copies/mL). Taylor negative result must be combined with clinical observations, patient history, and epidemiological information. The expected result is Negative.  Fact Sheet for Patients:  BloggerCourse.com  Fact Sheet for Healthcare Providers:  SeriousBroker.it  This test is no t yet approved or cleared by the [place] FDA and  has been authorized for detection and/or diagnosis of SARS-CoV-2 by FDA under an Emergency Use Authorization (EUA). This EUA will remain  in effect (meaning this test can be used) for the duration of the COVID-19 declaration under Section 564(b)(1) of the Act, 21 U.S.C.section 360bbb-3(b)(1), unless the authorization is terminated  or revoked sooner.       Influenza Taylor by PCR NEGATIVE NEGATIVE Final   Influenza B by PCR NEGATIVE NEGATIVE Final    Comment: (NOTE) The Xpert Xpress SARS-CoV-2/FLU/RSV plus assay is intended as an aid in the diagnosis of influenza from Nasopharyngeal swab specimens and should not be used as Taylor sole basis for treatment.  Nasal washings and aspirates are unacceptable for Xpert Xpress SARS-CoV-2/FLU/RSV testing.  Fact Sheet for Patients: BloggerCourse.com  Fact Sheet for Healthcare Providers: SeriousBroker.it  This test is not yet approved or cleared by the [place] FDA and has been authorized for detection and/or diagnosis of SARS-CoV-2 by FDA under an Emergency Use Authorization (EUA). This EUA will remain in effect (meaning this test can be used) for the duration of the COVID-19 declaration under Section 564(b)(1) of the Act, 21 U.S.C. section 360bbb-3(b)(1), unless the authorization is terminated or revoked.  Performed at [name], [address] [place], [place] [address]      Time coordinating discharge: Over 30 minutes  SIGNED:   Lynn Ito, MD  [facility] 06/02/2021, 1:45 PM Pager   If 7PM-7AM, please contact night-coverage [email or web address] Password [email or web address]

## 2021-06-02 NOTE — TOC Progression Note (Signed)
Transition of Care [facility]) - Progression Note    Patient Details  Name: Taylor [name] MRN: [id number] Date of Birth: Nov 04, 1985  Transition of [facility]) CM/SW Contact  Bing Quarry, RN Phone Number: 06/02/2021, 1:08 PM  Clinical Narrative: Patient to be discharged today with recommendation for [facility] PT/OT. All DME ordered has been delivered to room or to patient home. Family at home to receive patient. Have been unable to secure home health agency that will accept patient's insurance. Will need [facility] called by 230 for transport to home today 06/02/21. Gabriel Cirri RN CM         Barriers to Discharge: No Home Care Agency will accept this patient  Expected Discharge Plan and Services           Expected Discharge Date: 06/02/21               DME Arranged: Hospital bed, Wheelchair manual, 3-N-1 (Bariatric 3:1) DME Agency: [employer] Date DME Agency Contacted: 06/01/21 (Per prior CM)   Representative spoke with at DME Agency: Ian Malkin per prior CM note. HH Arranged: NA (Unable to find HH agency to accept insurance so far.) HH Agency: NA         Social Determinants of Health (SDOH) Interventions    Readmission Risk Interventions No flowsheet data found.

## 2021-06-02 NOTE — TOC Transition Note (Signed)
Transition of Care [facility]) - CM/SW Discharge Note   Patient Details  Name: Taylor [name] MRN: [id number] Date of Birth: 01-Feb-1986  [facility]) CM/SW Contact:  Bing Quarry, RN Phone Number: 06/02/2021, 2:15 PM   Clinical Narrative: Patient will contact post discharge providers/follow up appointments/PCP for outpatient therapy services when she feels ready to conserve PT days. Transportation home arranged via [employer]. Communicated with provider and unit RN. Medical Nec.form and Facesheet printed to unit. Gabriel Cirri RN CM       Final next level of care: Home/Self Care Barriers to Discharge: Barriers Resolved   Patient Goals and CMS Choice        Discharge Placement                    Patient and family notified of of transfer: 06/02/21 (Spoke with patient and discharge plan and transportation plan.)  Discharge Plan and Services                DME Arranged: Hospital bed, Wheelchair manual, 3-N-1 (Bariatric 3:1) DME Agency: [employer] Date DME Agency Contacted: 06/01/21 (Per prior CM)   Representative spoke with at DME Agency: Ian Malkin per prior CM note. HH Arranged:  (Patient will set up outpatient therapy services via post discharge providers/PCP.) HH Agency: NA        Social Determinants of Health (SDOH) Interventions     Readmission Risk Interventions No flowsheet data found.

## 2021-06-04 ENCOUNTER — Other Ambulatory Visit: Payer: Self-pay | Admitting: *Deleted

## 2021-06-04 NOTE — Patient Outreach (Signed)
[facility]) Care Management  06/04/2021  Taylor [name] 04-10-1986 [id number]   Transition of care telephone call  Referral received:05/30/21 Initial outreach:05/31/21 Insurance: [facility]   Initial unsuccessful telephone call to patient's preferred number in order to complete transition of care assessment; no answer, left HIPAA compliant voicemail message requesting return call.   Objective: Per the electronic medical record, Taylor [name]  was hospitalized at [facility] 8/15-8/20/22, she was evaluated at ED visit for left knee pain discharged and referred to Ortho, while getting into care she twisted her right knee presented back to ED imaging to right knee revealed tibial plateau fracture right patellar fracture ortho consulted and recommended non operative services., Left knee ACL tear.  Comorbidities include: Anxiety, GERD, hypertension was discharged to home on 06/02/21 without the need for home health services or durable medical equipment per the discharge summary.   Plan: This RNCM will route unsuccessful outreach letter with [facility] Care Management pamphlet and 24 hour Nurse Advice Line Magnet to [employer] Care Management clinical pool to be mailed to patient's home address. This RNCM will attempt another outreach within 4 business days.    Egbert Garibaldi, RN, BSN  [facility] Care Management,Care Management Coordinator  [phone]- Mobile [phone]- Toll Free Main Office

## 2021-06-07 ENCOUNTER — Other Ambulatory Visit: Payer: Self-pay | Admitting: *Deleted

## 2021-06-07 DIAGNOSIS — S82009A Unspecified fracture of unspecified patella, initial encounter for closed fracture: Secondary | ICD-10-CM | POA: Diagnosis not present

## 2021-06-07 DIAGNOSIS — S82131A Displaced fracture of medial condyle of right tibia, initial encounter for closed fracture: Secondary | ICD-10-CM | POA: Diagnosis not present

## 2021-06-07 NOTE — Patient Outreach (Signed)
[facility]) Care Management  06/07/2021  Taylor GUGLIOTTA 12-17-1985 [id number]   Transition of care call Referral received: 05/30/21 Initial outreach attempt: 05/31/21 Insurance: [employer]   2nd unsuccessful telephone call to patient's preferred contact number in order to complete post hospital discharge transition of care assessment , no answer left HIPAA compliant message requesting return call.    Objective: Per the electronic medical record, Taylor [name]  was hospitalized at [facility] 8/15-8/20/22, she was evaluated at ED visit for left knee pain discharged and referred to Ortho, while getting into care she twisted her right knee presented back to ED imaging to right knee revealed tibial plateau fracture right patellar fracture ortho consulted and recommended non operative services., Left knee ACL tear.  Comorbidities include: Anxiety, GERD, hypertension . Patient was discharged to home on 06/02/21 without the need for home health services or durable medical equipment per the discharge summary.     Plan If no return call from patient will attempt 3rd outreach in the next 4 business days.   Egbert Garibaldi, RN, BSN  [facility] Care Management,Care Management Coordinator  [phone]- Mobile [phone]- Toll Free Main Office

## 2021-06-11 DIAGNOSIS — S82131A Displaced fracture of medial condyle of right tibia, initial encounter for closed fracture: Secondary | ICD-10-CM | POA: Diagnosis not present

## 2021-06-11 DIAGNOSIS — S83512A Sprain of anterior cruciate ligament of left knee, initial encounter: Secondary | ICD-10-CM | POA: Diagnosis not present

## 2021-06-11 DIAGNOSIS — S82142A Displaced bicondylar fracture of left tibia, initial encounter for closed fracture: Secondary | ICD-10-CM | POA: Diagnosis not present

## 2021-06-11 DIAGNOSIS — S82121A Displaced fracture of lateral condyle of right tibia, initial encounter for closed fracture: Secondary | ICD-10-CM | POA: Diagnosis not present

## 2021-06-13 ENCOUNTER — Other Ambulatory Visit: Payer: Self-pay | Admitting: *Deleted

## 2021-06-13 DIAGNOSIS — M25562 Pain in left knee: Secondary | ICD-10-CM | POA: Diagnosis not present

## 2021-06-13 DIAGNOSIS — M6281 Muscle weakness (generalized): Secondary | ICD-10-CM | POA: Diagnosis not present

## 2021-06-13 DIAGNOSIS — M25561 Pain in right knee: Secondary | ICD-10-CM | POA: Diagnosis not present

## 2021-06-13 NOTE — Patient Outreach (Signed)
[facility]) Care Management  06/13/2021  Taylor [name] 12-29-1985 [id number]   Transition of care call Referral received: 05/30/21 Initial outreach attempt:05/31/21 Insurance: [employer]   #3 Call outreach  Third unsuccessful telephone call to patient's preferred contact number in order to complete post hospital discharge transition of care assessment; no answer, left HIPAA compliant message requesting return call.   Objective: Per the electronic medical record, Taylor [name]  was hospitalized at [facility] 8/15-8/20/22, she was evaluated at ED visit for left knee pain discharged and referred to Ortho, while getting into car she twisted her right knee presented back to ED imaging to right knee revealed tibial plateau fracture right patellar fracture ortho consulted and recommended non operative services., Left knee ACL tear.  Comorbidities include: Anxiety, GERD, hypertension . Patient was discharged to home on 06/02/21 without the need for home health services or durable medical equipment per the discharge summary. For follow up with  Orthopedic /PCP for outpatient therapy when ready  Plan: If no return call from patient, will plan return call in the next 3 weeks.   Egbert Garibaldi, RN, BSN  [facility] Care Management,Care Management Coordinator  [phone]- Mobile [phone]- Toll Free Main Office

## 2021-06-19 DIAGNOSIS — M25562 Pain in left knee: Secondary | ICD-10-CM | POA: Diagnosis not present

## 2021-06-19 DIAGNOSIS — M25561 Pain in right knee: Secondary | ICD-10-CM | POA: Diagnosis not present

## 2021-06-19 DIAGNOSIS — M6281 Muscle weakness (generalized): Secondary | ICD-10-CM | POA: Diagnosis not present

## 2021-06-25 DIAGNOSIS — M25561 Pain in right knee: Secondary | ICD-10-CM | POA: Diagnosis not present

## 2021-06-25 DIAGNOSIS — M25562 Pain in left knee: Secondary | ICD-10-CM | POA: Diagnosis not present

## 2021-06-25 DIAGNOSIS — M6281 Muscle weakness (generalized): Secondary | ICD-10-CM | POA: Diagnosis not present

## 2021-06-26 ENCOUNTER — Other Ambulatory Visit: Payer: Self-pay | Admitting: Orthopedic Surgery

## 2021-06-26 DIAGNOSIS — S82131A Displaced fracture of medial condyle of right tibia, initial encounter for closed fracture: Secondary | ICD-10-CM

## 2021-07-02 DIAGNOSIS — S82131A Displaced fracture of medial condyle of right tibia, initial encounter for closed fracture: Secondary | ICD-10-CM | POA: Diagnosis not present

## 2021-07-02 DIAGNOSIS — S82009A Unspecified fracture of unspecified patella, initial encounter for closed fracture: Secondary | ICD-10-CM | POA: Diagnosis not present

## 2021-07-03 ENCOUNTER — Encounter: Payer: Self-pay | Admitting: *Deleted

## 2021-07-03 ENCOUNTER — Ambulatory Visit
Admission: RE | Admit: 2021-07-03 | Discharge: 2021-07-03 | Disposition: A | Discharge status: Home / Self Care | Payer: 59 | Source: Ambulatory Visit | Attending: Orthopedic Surgery | Admitting: Orthopedic Surgery

## 2021-07-03 ENCOUNTER — Other Ambulatory Visit: Payer: Self-pay

## 2021-07-03 ENCOUNTER — Other Ambulatory Visit: Payer: Self-pay | Admitting: *Deleted

## 2021-07-03 DIAGNOSIS — M7989 Other specified soft tissue disorders: Secondary | ICD-10-CM | POA: Diagnosis not present

## 2021-07-03 DIAGNOSIS — S83511A Sprain of anterior cruciate ligament of right knee, initial encounter: Secondary | ICD-10-CM | POA: Diagnosis not present

## 2021-07-03 DIAGNOSIS — S82001A Unspecified fracture of right patella, initial encounter for closed fracture: Secondary | ICD-10-CM | POA: Diagnosis not present

## 2021-07-03 DIAGNOSIS — S82131A Displaced fracture of medial condyle of right tibia, initial encounter for closed fracture: Secondary | ICD-10-CM | POA: Insufficient documentation

## 2021-07-03 DIAGNOSIS — S82141A Displaced bicondylar fracture of right tibia, initial encounter for closed fracture: Secondary | ICD-10-CM | POA: Diagnosis not present

## 2021-07-03 IMAGING — MR MR KNEE*R* W/O CM
7 series · 40 of 40 positions shown · non-contrast
Comparison: MR Knee dated [DATE], CT Knee dated [DATE],
Radiography of the Knee dated [DATE]

CLINICAL DATA: No surg; pt fell and injury in [REDACTED]; CT scan
done; c/o general rt knee pain and swelling

EXAM:
MRI OF THE RIGHT KNEE WITHOUT CONTRAST
TECHNIQUE: Multiplanar, multisequence MR imaging of the knee was performed. No
intravenous contrast was administered.

[Series 8: T2 fat-sat · axial · right · 4.0mm · 0.50mm/px · z∈[-55,+99]mm · 6 of 32 slices shown (1 of 3)]
[im 1/32]
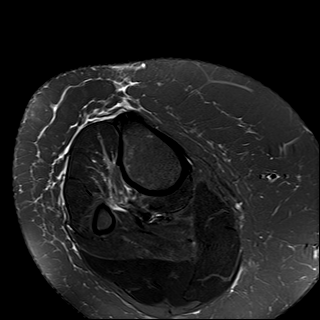
[im 7/32]
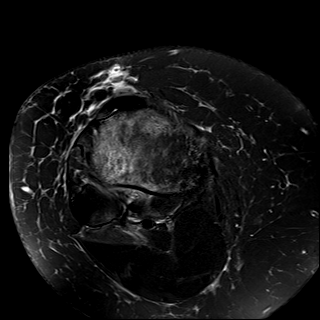
[im 13/32]
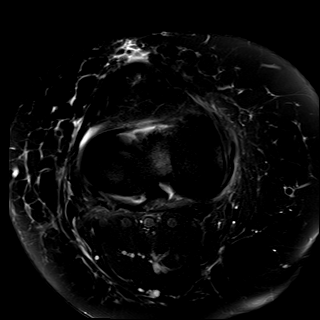
[im 19/32]
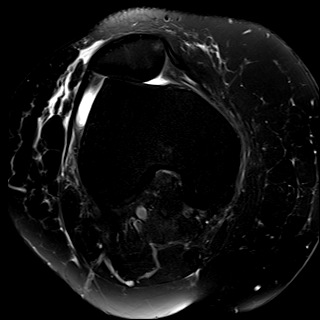
[im 25/32]
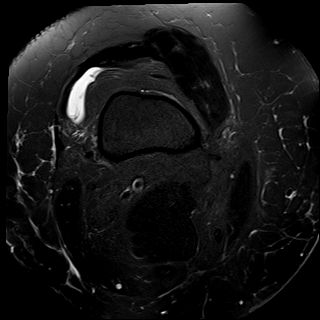
[im 32/32]
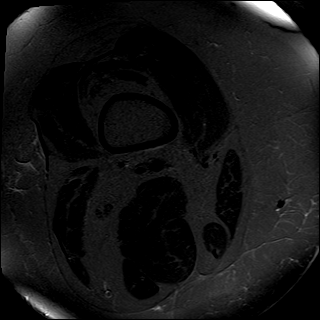

[Series 9: T2 fat-sat · coronal · right · 4.0mm · 0.62mm/px · 6 of 30 slices shown (2 of 3)]
[im 1/30]
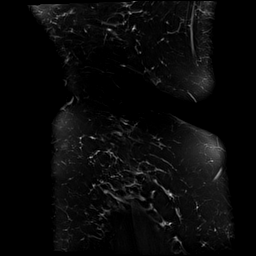
[im 6/30]
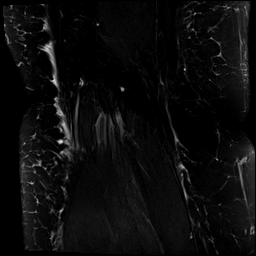
[im 12/30]
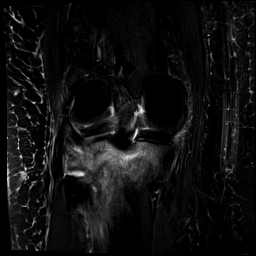
[im 18/30]
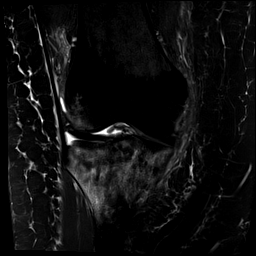
[im 24/30]
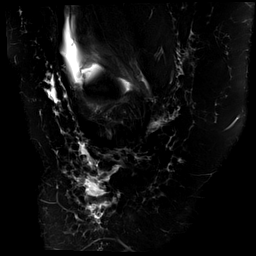
[im 30/30]
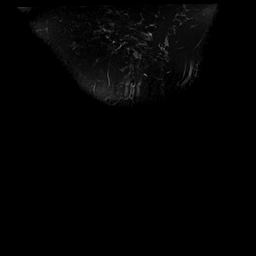

[Series 10: T1 · coronal · right · 4.0mm · 0.62mm/px · 6 of 30 slices shown]
[im 1/30]
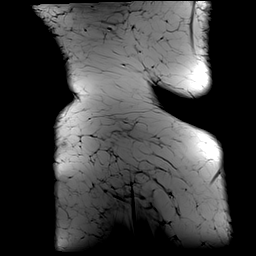
[im 6/30]
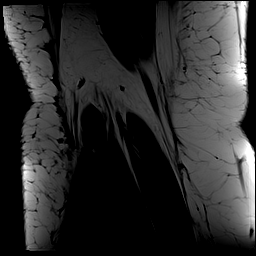
[im 12/30]
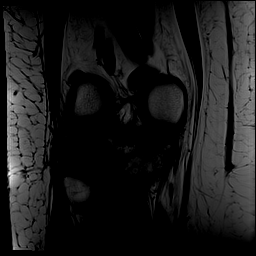
[im 18/30]
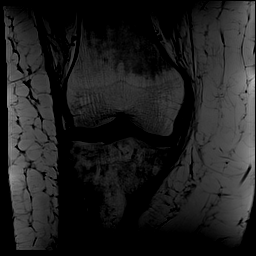
[im 24/30]
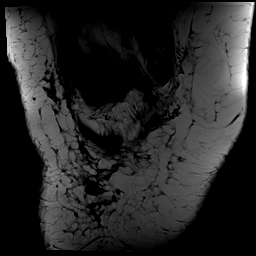
[im 30/30]
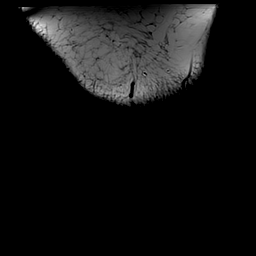

[Series 11: PD fat-sat · coronal · right · 4.0mm · 0.62mm/px · 6 of 30 slices shown (1 of 2)]
[im 1/30]
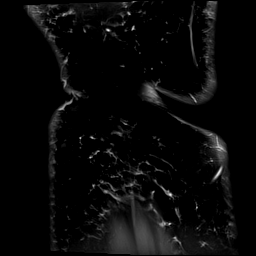
[im 6/30]
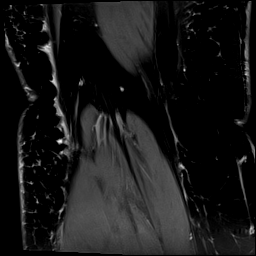
[im 12/30]
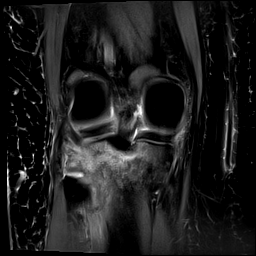
[im 18/30]
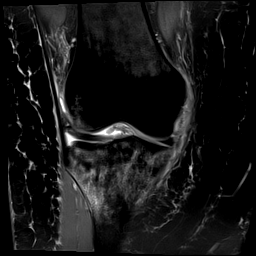
[im 24/30]
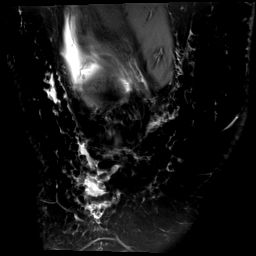
[im 30/30]
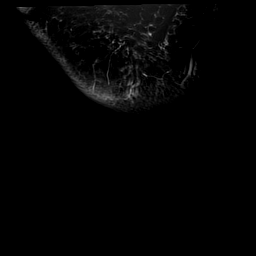

[Series 12: PD fat-sat · sagittal · right · 3.0mm · 0.62mm/px · 6 of 28 slices shown (2 of 2)]
[im 1/28]
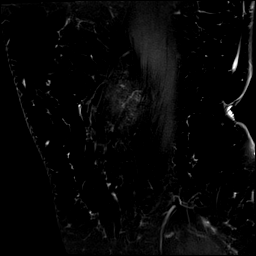
[im 6/28]
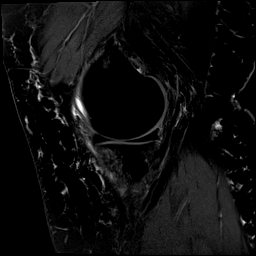
[im 11/28]
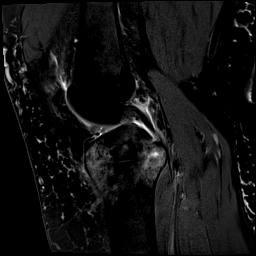
[im 17/28]
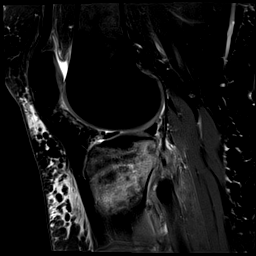
[im 22/28]
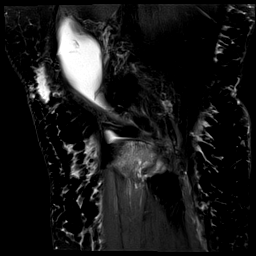
[im 28/28]
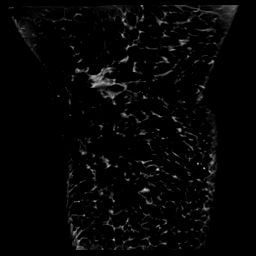

[Series 13: T2 fat-sat · sagittal · right · 3.0mm · 0.62mm/px · 7 of 36 slices shown (3 of 3)]
[im 1/36]
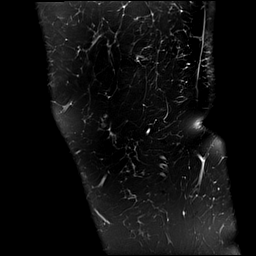
[im 6/36]
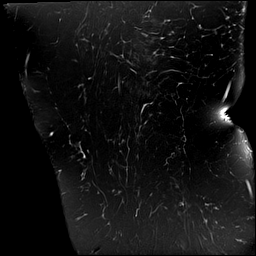
[im 12/36]
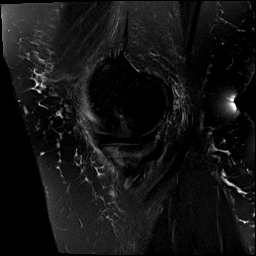
[im 18/36]
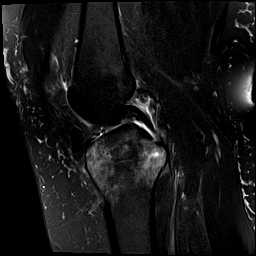
[im 24/36]
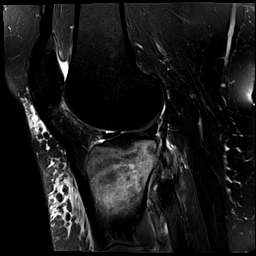
[im 30/36]
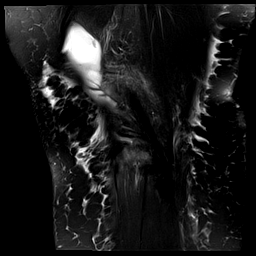
[im 36/36]
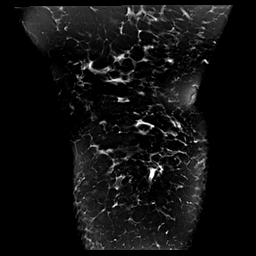

[Series 14: PD · oblique · right · 2.0mm · 0.47mm/px · 3 of 16 slices shown]
[im 1/16]
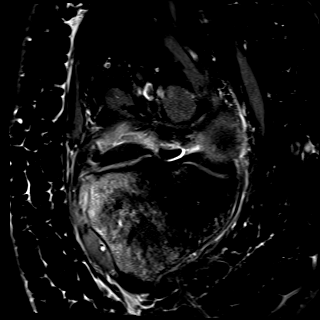
[im 8/16]
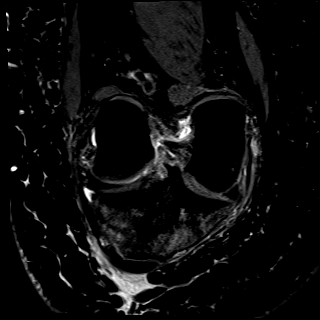
[im 16/16]
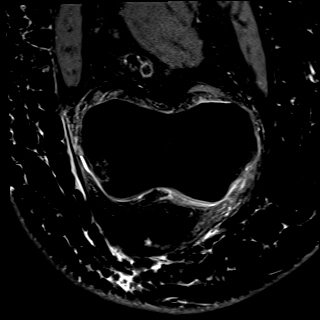

[40 of 40 positions shown; findings below may reference images not displayed]

FINDINGS: MENISCI

Medial meniscus:  Intact.

Lateral meniscus:  Intact.

LIGAMENTS

Cruciates:  Complete ACL tear.  Intact PCL.

Collaterals: Intact MCL with periligamentous edema. Lateral
collateral ligament complex intact.

CARTILAGE

Patellofemoral:  No chondral defect.

Medial:  No chondral defect.

Lateral: Depressed posterior lateral tibial plateau fracture without
displaced cartilage defect. Lateral femoral condylar articular
cartilage is intact.

Joint:  Moderate knee joint effusion. Fat pads within normal limits.

Popliteal Fossa: Mild intramuscular edema within the popliteus
muscle. Tendon intact. Mild soleus muscle edema. No Baker's cyst.

Extensor Mechanism:  Intact quadriceps tendon and patellar tendon.

Bones: Acute to subacute mildly depressed fracture involving the
posterior aspect of the lateral tibial plateau with prominent bone
marrow edema throughout the proximal tibia. Minimally displaced
avulsion fracture along the lateral nonarticular aspect of the
lateral tibial plateau. Tiny cortical avulsion fracture along the
inferomedial aspect of the patella with mild adjacent marrow edema.
Additional bone contusion with nondisplaced subcortical fracture
involving the peripheral aspect of the lateral femoral condyle.

Other: Subcutaneous edema with ill-defined fluid in the soft tissues
anteriorly. There is also some intramuscular edema within the
anterior compartment musculature proximally.
IMPRESSION: 1. Complete ACL tear.
2. Mildly displaced fracture involving the posterior aspect of the
lateral tibial plateau.
3. Minimally displaced avulsion fracture along the lateral
nonarticular aspect of the lateral tibial plateau (Segond fracture).
4. Additional bone contusion with nondisplaced subcortical fracture
involving the peripheral aspect of the lateral femoral condyle.
5. Tiny cortical avulsion fracture along the inferomedial aspect of
the patella.
6. Grade 1 MCL sprain.
7. Mild intramuscular edema within the popliteus, soleus, and
anterior compartment musculature, likely muscle strains.
8. Moderate knee joint effusion.
9. Intact menisci.

## 2021-07-03 NOTE — Patient Outreach (Signed)
[facility]) Care Management  07/03/2021  Taylor [name] 04/10/1986 [id number]  Transition of care call/case closure   Referral received:05/30/21 Initial outreach:05/31/21 Insurance: [facility]    4th call attempt  Subjective: 4th attempt successful telephone call to patient's preferred number in order to complete transition of care assessment; 2 HIPAA identifiers verified. Explained purpose of call and completed transition of care assessment.  Patient discussed progress since hospital discharge  using rolling walker, reports pain control with prn medications. She discussed participating in outpatient physical therapy and plans for follow up MRI of right knee to evaluate progress for plans as she states right knee gives out during therapy. She is tolerating diet, denies bowel or bladder problems. Her family/finance are assisting with her recovery.   Reviewed accessing the following [place] Benefits : .  She does have the hospital indemnity plan and has filed a claim, she has made contact and accessing FMLA benefit provided contact number to [facility] health benefits office for additional questions.  She uses a [employer] outpatient pharmacy [facility] employee pharmacy.     Objective:   Taylor [name]  was hospitalized at [facility] 8/15-8/20/22, she was evaluated at ED visit for left knee pain discharged and referred to Ortho, while getting into car she twisted her right knee presented back to ED imaging to right knee revealed tibial plateau fracture right patellar fracture ortho consulted and recommended non operative services., Left knee ACL tear.  Comorbidities include: Anxiety, GERD, hypertension . Patient was discharged to home on 06/02/21 without the need for home health services or durable medical equipment per the discharge summary.      Assessment:  Patient voices good understanding of all discharge instructions.  See transition of care flowsheet  for assessment details.   Plan:  Reviewed hospital discharge diagnosis of Right tibial plateau fracture, right patellar fracture   and discharge treatment plan using hospital discharge instructions, assessing medication adherence, reviewing problems requiring provider notification, and discussing the importance of follow up with surgeon, primary care provider and/or specialists as directed.  Reviewed [place] healthy lifestyle program information to receive discounted premium for  2023   Step 1: Get  your annual physical  Step 2: Complete your health assessment  Step 3:Identify your current health status and complete the corresponding action step between October 14, 2020 and June 14, 2021.    No ongoing care management needs identified so will close case to [facility] Care Management services   Egbert Garibaldi, RN, BSN  [facility] Care Management,Care Management Coordinator  [phone]- Mobile [phone]- Toll Free Main Office

## 2021-07-04 DIAGNOSIS — M6281 Muscle weakness (generalized): Secondary | ICD-10-CM | POA: Diagnosis not present

## 2021-07-04 DIAGNOSIS — M25562 Pain in left knee: Secondary | ICD-10-CM | POA: Diagnosis not present

## 2021-07-04 DIAGNOSIS — M25561 Pain in right knee: Secondary | ICD-10-CM | POA: Diagnosis not present

## 2021-07-06 DIAGNOSIS — S8991XD Unspecified injury of right lower leg, subsequent encounter: Secondary | ICD-10-CM | POA: Diagnosis not present

## 2021-07-06 DIAGNOSIS — S82142D Displaced bicondylar fracture of left tibia, subsequent encounter for closed fracture with routine healing: Secondary | ICD-10-CM | POA: Diagnosis not present

## 2021-07-06 DIAGNOSIS — S83512D Sprain of anterior cruciate ligament of left knee, subsequent encounter: Secondary | ICD-10-CM | POA: Diagnosis not present

## 2021-07-06 DIAGNOSIS — S82121D Displaced fracture of lateral condyle of right tibia, subsequent encounter for closed fracture with routine healing: Secondary | ICD-10-CM | POA: Diagnosis not present

## 2021-07-08 DIAGNOSIS — S82131A Displaced fracture of medial condyle of right tibia, initial encounter for closed fracture: Secondary | ICD-10-CM | POA: Diagnosis not present

## 2021-07-08 DIAGNOSIS — S82009A Unspecified fracture of unspecified patella, initial encounter for closed fracture: Secondary | ICD-10-CM | POA: Diagnosis not present

## 2021-07-11 DIAGNOSIS — M6281 Muscle weakness (generalized): Secondary | ICD-10-CM | POA: Diagnosis not present

## 2021-07-11 DIAGNOSIS — M25562 Pain in left knee: Secondary | ICD-10-CM | POA: Diagnosis not present

## 2021-07-11 DIAGNOSIS — M25561 Pain in right knee: Secondary | ICD-10-CM | POA: Diagnosis not present

## 2021-07-18 DIAGNOSIS — M25561 Pain in right knee: Secondary | ICD-10-CM | POA: Diagnosis not present

## 2021-07-18 DIAGNOSIS — M6281 Muscle weakness (generalized): Secondary | ICD-10-CM | POA: Diagnosis not present

## 2021-07-18 DIAGNOSIS — M25562 Pain in left knee: Secondary | ICD-10-CM | POA: Diagnosis not present

## 2021-07-23 DIAGNOSIS — M6281 Muscle weakness (generalized): Secondary | ICD-10-CM | POA: Diagnosis not present

## 2021-07-23 DIAGNOSIS — M25562 Pain in left knee: Secondary | ICD-10-CM | POA: Diagnosis not present

## 2021-07-23 DIAGNOSIS — M25561 Pain in right knee: Secondary | ICD-10-CM | POA: Diagnosis not present

## 2021-07-27 DIAGNOSIS — M25562 Pain in left knee: Secondary | ICD-10-CM | POA: Diagnosis not present

## 2021-07-27 DIAGNOSIS — M25561 Pain in right knee: Secondary | ICD-10-CM | POA: Diagnosis not present

## 2021-07-27 DIAGNOSIS — M6281 Muscle weakness (generalized): Secondary | ICD-10-CM | POA: Diagnosis not present

## 2021-08-01 DIAGNOSIS — S82131A Displaced fracture of medial condyle of right tibia, initial encounter for closed fracture: Secondary | ICD-10-CM | POA: Diagnosis not present

## 2021-08-01 DIAGNOSIS — M25561 Pain in right knee: Secondary | ICD-10-CM | POA: Diagnosis not present

## 2021-08-01 DIAGNOSIS — M6281 Muscle weakness (generalized): Secondary | ICD-10-CM | POA: Diagnosis not present

## 2021-08-01 DIAGNOSIS — M25562 Pain in left knee: Secondary | ICD-10-CM | POA: Diagnosis not present

## 2021-08-01 DIAGNOSIS — S82009A Unspecified fracture of unspecified patella, initial encounter for closed fracture: Secondary | ICD-10-CM | POA: Diagnosis not present

## 2021-08-03 DIAGNOSIS — S82121D Displaced fracture of lateral condyle of right tibia, subsequent encounter for closed fracture with routine healing: Secondary | ICD-10-CM | POA: Diagnosis not present

## 2021-08-03 DIAGNOSIS — S83511D Sprain of anterior cruciate ligament of right knee, subsequent encounter: Secondary | ICD-10-CM | POA: Diagnosis not present

## 2021-08-03 DIAGNOSIS — S82142D Displaced bicondylar fracture of left tibia, subsequent encounter for closed fracture with routine healing: Secondary | ICD-10-CM | POA: Diagnosis not present

## 2021-08-03 DIAGNOSIS — S83512D Sprain of anterior cruciate ligament of left knee, subsequent encounter: Secondary | ICD-10-CM | POA: Diagnosis not present

## 2021-08-06 DIAGNOSIS — M6281 Muscle weakness (generalized): Secondary | ICD-10-CM | POA: Diagnosis not present

## 2021-08-06 DIAGNOSIS — M25562 Pain in left knee: Secondary | ICD-10-CM | POA: Diagnosis not present

## 2021-08-06 DIAGNOSIS — M25561 Pain in right knee: Secondary | ICD-10-CM | POA: Diagnosis not present

## 2021-08-07 DIAGNOSIS — S82131A Displaced fracture of medial condyle of right tibia, initial encounter for closed fracture: Secondary | ICD-10-CM | POA: Diagnosis not present

## 2021-08-07 DIAGNOSIS — S82009A Unspecified fracture of unspecified patella, initial encounter for closed fracture: Secondary | ICD-10-CM | POA: Diagnosis not present

## 2021-08-13 DIAGNOSIS — M6281 Muscle weakness (generalized): Secondary | ICD-10-CM | POA: Diagnosis not present

## 2021-08-13 DIAGNOSIS — M25561 Pain in right knee: Secondary | ICD-10-CM | POA: Diagnosis not present

## 2021-08-13 DIAGNOSIS — M25562 Pain in left knee: Secondary | ICD-10-CM | POA: Diagnosis not present

## 2021-08-15 DIAGNOSIS — M25562 Pain in left knee: Secondary | ICD-10-CM | POA: Diagnosis not present

## 2021-08-15 DIAGNOSIS — M25561 Pain in right knee: Secondary | ICD-10-CM | POA: Diagnosis not present

## 2021-08-15 DIAGNOSIS — M6281 Muscle weakness (generalized): Secondary | ICD-10-CM | POA: Diagnosis not present

## 2021-08-22 DIAGNOSIS — S83512A Sprain of anterior cruciate ligament of left knee, initial encounter: Secondary | ICD-10-CM | POA: Diagnosis not present

## 2021-08-22 DIAGNOSIS — S83511A Sprain of anterior cruciate ligament of right knee, initial encounter: Secondary | ICD-10-CM | POA: Diagnosis not present

## 2021-08-23 DIAGNOSIS — M6281 Muscle weakness (generalized): Secondary | ICD-10-CM | POA: Diagnosis not present

## 2021-08-23 DIAGNOSIS — M25561 Pain in right knee: Secondary | ICD-10-CM | POA: Diagnosis not present

## 2021-08-23 DIAGNOSIS — M25562 Pain in left knee: Secondary | ICD-10-CM | POA: Diagnosis not present

## 2021-08-29 DIAGNOSIS — M25562 Pain in left knee: Secondary | ICD-10-CM | POA: Diagnosis not present

## 2021-08-29 DIAGNOSIS — M6281 Muscle weakness (generalized): Secondary | ICD-10-CM | POA: Diagnosis not present

## 2021-08-29 DIAGNOSIS — M25561 Pain in right knee: Secondary | ICD-10-CM | POA: Diagnosis not present

## 2021-08-31 DIAGNOSIS — S83512D Sprain of anterior cruciate ligament of left knee, subsequent encounter: Secondary | ICD-10-CM | POA: Diagnosis not present

## 2021-08-31 DIAGNOSIS — S82142D Displaced bicondylar fracture of left tibia, subsequent encounter for closed fracture with routine healing: Secondary | ICD-10-CM | POA: Diagnosis not present

## 2021-08-31 DIAGNOSIS — S83511D Sprain of anterior cruciate ligament of right knee, subsequent encounter: Secondary | ICD-10-CM | POA: Diagnosis not present

## 2021-08-31 DIAGNOSIS — S82121D Displaced fracture of lateral condyle of right tibia, subsequent encounter for closed fracture with routine healing: Secondary | ICD-10-CM | POA: Diagnosis not present

## 2021-09-01 DIAGNOSIS — S82131A Displaced fracture of medial condyle of right tibia, initial encounter for closed fracture: Secondary | ICD-10-CM | POA: Diagnosis not present

## 2021-09-01 DIAGNOSIS — S82009A Unspecified fracture of unspecified patella, initial encounter for closed fracture: Secondary | ICD-10-CM | POA: Diagnosis not present

## 2021-09-03 DIAGNOSIS — M6281 Muscle weakness (generalized): Secondary | ICD-10-CM | POA: Diagnosis not present

## 2021-09-03 DIAGNOSIS — F419 Anxiety disorder, unspecified: Secondary | ICD-10-CM | POA: Diagnosis not present

## 2021-09-03 DIAGNOSIS — I10 Essential (primary) hypertension: Secondary | ICD-10-CM | POA: Diagnosis not present

## 2021-09-03 DIAGNOSIS — E039 Hypothyroidism, unspecified: Secondary | ICD-10-CM | POA: Diagnosis not present

## 2021-09-03 DIAGNOSIS — M25562 Pain in left knee: Secondary | ICD-10-CM | POA: Diagnosis not present

## 2021-09-03 DIAGNOSIS — Z79899 Other long term (current) drug therapy: Secondary | ICD-10-CM | POA: Diagnosis not present

## 2021-09-03 DIAGNOSIS — E559 Vitamin D deficiency, unspecified: Secondary | ICD-10-CM | POA: Diagnosis not present

## 2021-09-03 DIAGNOSIS — K219 Gastro-esophageal reflux disease without esophagitis: Secondary | ICD-10-CM | POA: Diagnosis not present

## 2021-09-03 DIAGNOSIS — E538 Deficiency of other specified B group vitamins: Secondary | ICD-10-CM | POA: Diagnosis not present

## 2021-09-03 DIAGNOSIS — M25561 Pain in right knee: Secondary | ICD-10-CM | POA: Diagnosis not present

## 2021-09-04 ENCOUNTER — Other Ambulatory Visit: Payer: Self-pay | Admitting: Orthopedic Surgery

## 2021-09-04 ENCOUNTER — Other Ambulatory Visit: Payer: Self-pay

## 2021-09-04 DIAGNOSIS — M25562 Pain in left knee: Secondary | ICD-10-CM

## 2021-09-04 DIAGNOSIS — S83511A Sprain of anterior cruciate ligament of right knee, initial encounter: Secondary | ICD-10-CM

## 2021-09-04 DIAGNOSIS — M25561 Pain in right knee: Secondary | ICD-10-CM

## 2021-09-04 DIAGNOSIS — S83512A Sprain of anterior cruciate ligament of left knee, initial encounter: Secondary | ICD-10-CM | POA: Diagnosis not present

## 2021-09-07 DIAGNOSIS — S82009A Unspecified fracture of unspecified patella, initial encounter for closed fracture: Secondary | ICD-10-CM | POA: Diagnosis not present

## 2021-09-07 DIAGNOSIS — S82131A Displaced fracture of medial condyle of right tibia, initial encounter for closed fracture: Secondary | ICD-10-CM | POA: Diagnosis not present

## 2021-09-10 DIAGNOSIS — M25561 Pain in right knee: Secondary | ICD-10-CM | POA: Diagnosis not present

## 2021-09-10 DIAGNOSIS — M25562 Pain in left knee: Secondary | ICD-10-CM | POA: Diagnosis not present

## 2021-09-10 DIAGNOSIS — M6281 Muscle weakness (generalized): Secondary | ICD-10-CM | POA: Diagnosis not present

## 2021-09-11 ENCOUNTER — Other Ambulatory Visit: Payer: Self-pay

## 2021-09-11 ENCOUNTER — Ambulatory Visit
Admission: RE | Admit: 2021-09-11 | Discharge: 2021-09-11 | Disposition: A | Discharge status: Home / Self Care | Payer: 59 | Source: Ambulatory Visit | Attending: Orthopedic Surgery | Admitting: Orthopedic Surgery

## 2021-09-11 DIAGNOSIS — M25561 Pain in right knee: Secondary | ICD-10-CM | POA: Diagnosis not present

## 2021-09-11 DIAGNOSIS — S83511A Sprain of anterior cruciate ligament of right knee, initial encounter: Secondary | ICD-10-CM | POA: Diagnosis not present

## 2021-09-11 DIAGNOSIS — M25562 Pain in left knee: Secondary | ICD-10-CM | POA: Diagnosis not present

## 2021-09-11 DIAGNOSIS — S83512A Sprain of anterior cruciate ligament of left knee, initial encounter: Secondary | ICD-10-CM | POA: Insufficient documentation

## 2021-09-11 DIAGNOSIS — M25461 Effusion, right knee: Secondary | ICD-10-CM | POA: Diagnosis not present

## 2021-09-11 DIAGNOSIS — Z01818 Encounter for other preprocedural examination: Secondary | ICD-10-CM | POA: Diagnosis not present

## 2021-09-11 IMAGING — MR MR KNEE*R* W/O CM
7 series · 40 of 40 positions shown · non-contrast
Comparison: MR knee [DATE]; CT knee [DATE].

CLINICAL DATA: Preoperative for right knee ACL tear and fracture.

EXAM:
MRI OF THE RIGHT KNEE WITHOUT CONTRAST
TECHNIQUE: Multiplanar, multisequence MR imaging of the knee was performed. No
intravenous contrast was administered.

[Series 3: T2 fat-sat · axial · 4.0mm · 0.50mm/px · z∈[-95,+75]mm · 7 of 35 slices shown (1 of 3)]
[im 1/35]
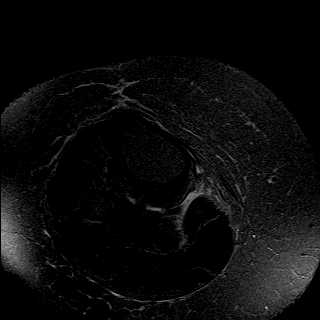
[im 6/35]
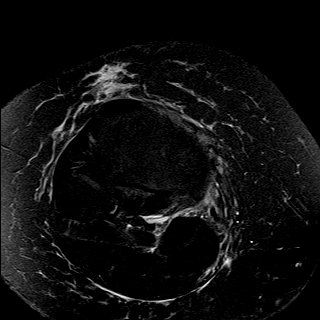
[im 12/35]
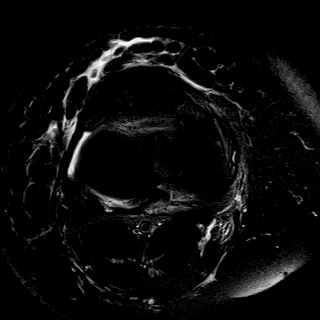
[im 18/35]
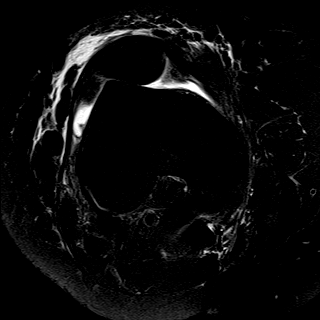
[im 23/35]
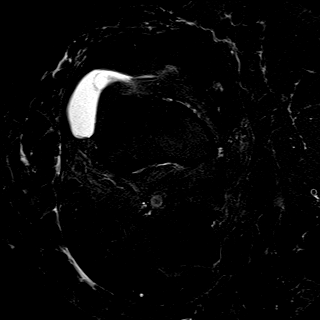
[im 29/35]
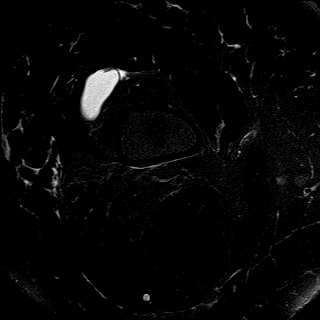
[im 35/35]
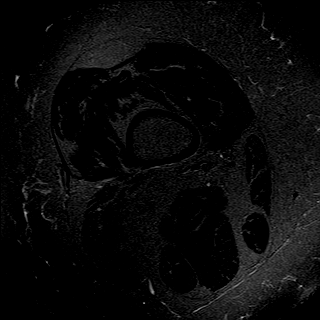

[Series 4: T1 · coronal · 4.0mm · 0.59mm/px · 6 of 33 slices shown]
[im 1/33]
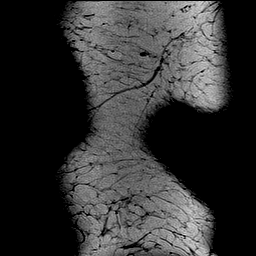
[im 7/33]
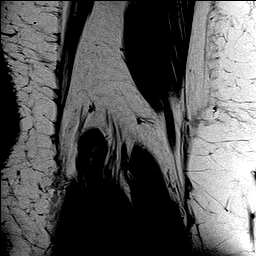
[im 13/33]
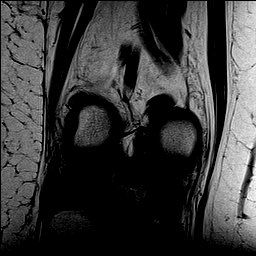
[im 20/33]
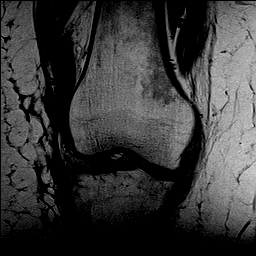
[im 26/33]
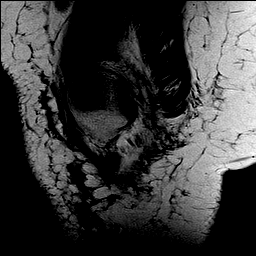
[im 33/33]
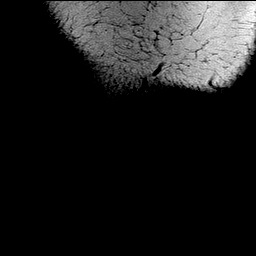

[Series 5: PD fat-sat · sagittal · 3.0mm · 0.59mm/px · 6 of 35 slices shown (1 of 3)]
[im 1/35]
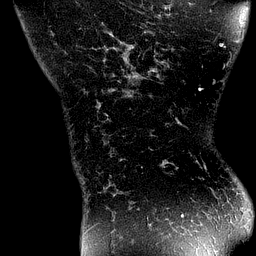
[im 7/35]
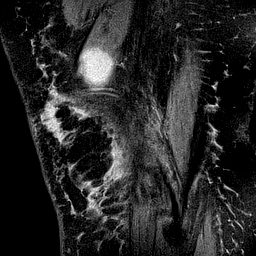
[im 14/35]
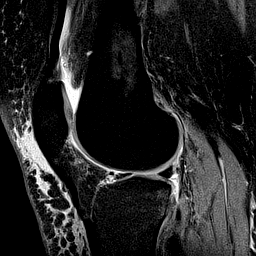
[im 21/35]
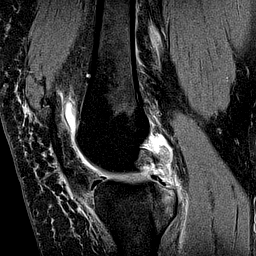
[im 28/35]
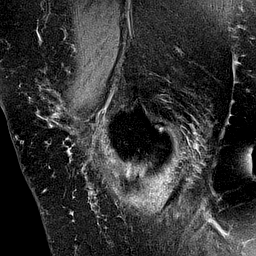
[im 35/35]
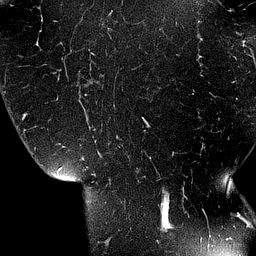

[Series 6: T2 fat-sat · coronal · 4.0mm · 0.59mm/px · 6 of 33 slices shown (2 of 3)]
[im 1/33]
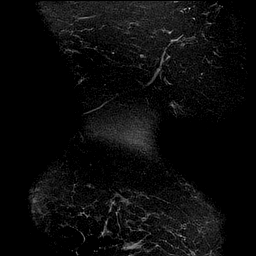
[im 7/33]
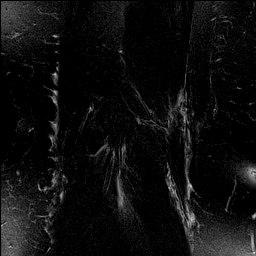
[im 13/33]
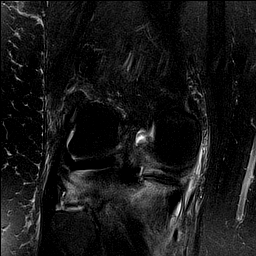
[im 20/33]
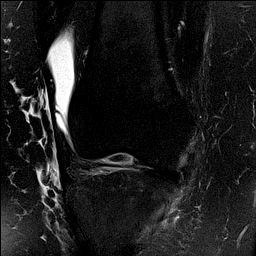
[im 26/33]
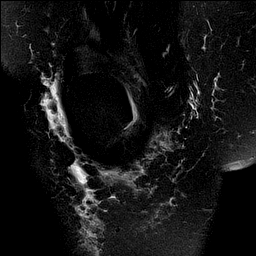
[im 33/33]
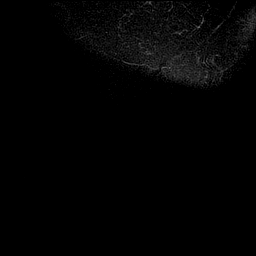

[Series 7: PD fat-sat · coronal · 4.0mm · 0.59mm/px · 6 of 33 slices shown (2 of 3)]
[im 1/33]
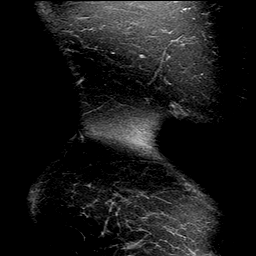
[im 7/33]
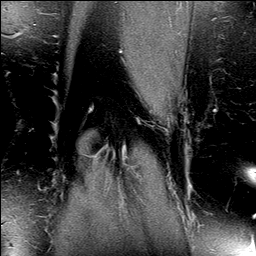
[im 13/33]
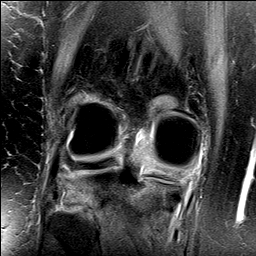
[im 20/33]
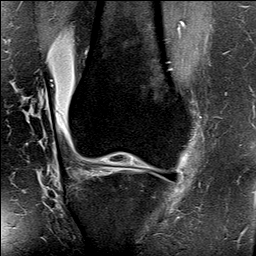
[im 26/33]
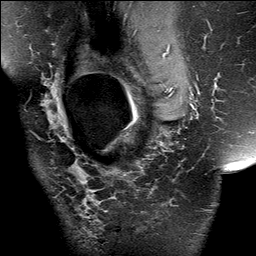
[im 33/33]
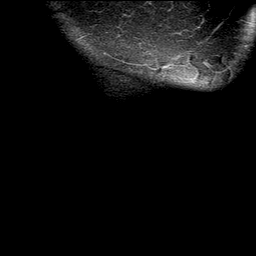

[Series 8: T2 fat-sat · sagittal · 3.0mm · 0.59mm/px · 6 of 35 slices shown (3 of 3)]
[im 1/35]
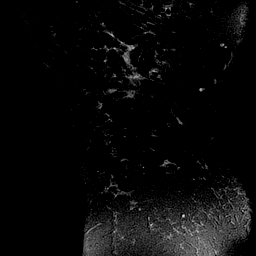
[im 7/35]
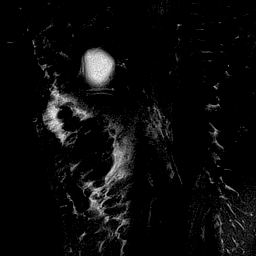
[im 14/35]
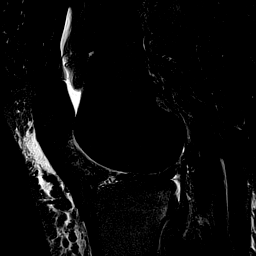
[im 21/35]
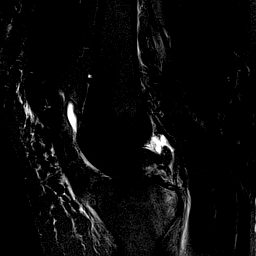
[im 28/35]
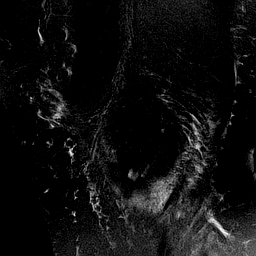
[im 35/35]
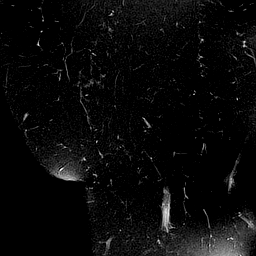

[Series 9: PD fat-sat · oblique · 2.0mm · 0.59mm/px · 3 of 19 slices shown (3 of 3)]
[im 1/19]
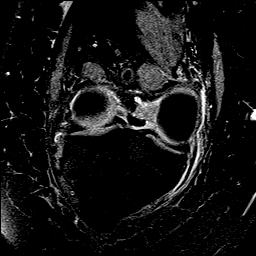
[im 10/19]
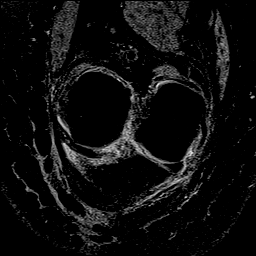
[im 19/19]
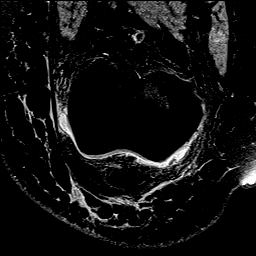

[40 of 40 positions shown; findings below may reference images not displayed]

FINDINGS: MENISCI

Medial: Possible small undersurface tear of the posterior horn of
the medial meniscus (image 23/series 5).

Lateral: Intact.

LIGAMENTS

Cruciates: Complete ACL tear.  Intact PCL.

Collaterals: Mild thickening of the MCL with edema superficial to
the MCL likely reflecting a subacute MCL strain without a tear.
Lateral collateral ligament complex is intact.

CARTILAGE

Patellofemoral:  No chondral defect.

Medial:  No chondral defect.

Lateral:  No chondral defect.

JOINT: Large joint effusion. Mild edema in Hoffa's fat. No plical
thickening.

POPLITEAL FOSSA: Popliteus tendon is intact. No Baker's cyst.

EXTENSOR MECHANISM: Intact quadriceps tendon. Intact patellar
tendon. Intact lateral patellar retinaculum. Intact medial patellar
retinaculum. Intact MPFL.

BONES: Mildly impacted and depressed fracture of the lateral tibial
plateau at the tibiofibular joint with surrounding bone marrow edema
which has mildly improved compared with the prior exam. Mild
persistent osseous contusion of the posteromedial tibial plateau.

Other: No fluid collection or hematoma. Muscles are normal.
IMPRESSION: 1. Complete ACL tear.
2. Mild subacute MCL strain.
3. Possible small undersurface tear of the posterior horn of the
medial meniscus (image 23/series 5).
4. Mildly impacted and depressed fracture of the lateral tibial
plateau at the tibiofibular joint with surrounding bone marrow edema
which has mildly improved compared with the prior exam.
5. Mild persistent osseous contusion of the posteromedial tibial
plateau.

## 2021-09-11 MED ORDER — SAXENDA 18 MG/3ML ~~LOC~~ SOPN
PEN_INJECTOR | SUBCUTANEOUS | 4 refills | Status: AC
  Filled 2021-09-11 – 2021-10-18 (×2): qty 15, 30d supply, fill #0

## 2021-09-11 MED ORDER — PANTOPRAZOLE SODIUM 40 MG PO TBEC
DELAYED_RELEASE_TABLET | ORAL | 1 refills | Status: AC
  Filled 2021-09-11: qty 90, 90d supply, fill #0
  Filled 2022-03-12: qty 90, 90d supply, fill #1

## 2021-09-11 MED ORDER — FLUOXETINE HCL 40 MG PO CAPS
ORAL_CAPSULE | ORAL | 1 refills | Status: AC
  Filled 2021-09-11: qty 90, 90d supply, fill #0
  Filled 2022-03-12: qty 90, 90d supply, fill #1

## 2021-09-11 MED ORDER — LEVOTHYROXINE SODIUM 112 MCG PO TABS
ORAL_TABLET | ORAL | 1 refills | Status: AC
  Filled 2021-09-11: qty 90, 90d supply, fill #0
  Filled 2022-03-12: qty 30, 30d supply, fill #1

## 2021-09-11 MED ORDER — FAMOTIDINE 20 MG PO TABS
ORAL_TABLET | ORAL | 1 refills | Status: AC
  Filled 2021-09-11: qty 180, 90d supply, fill #0

## 2021-09-17 DIAGNOSIS — M6281 Muscle weakness (generalized): Secondary | ICD-10-CM | POA: Diagnosis not present

## 2021-09-17 DIAGNOSIS — M25561 Pain in right knee: Secondary | ICD-10-CM | POA: Diagnosis not present

## 2021-09-17 DIAGNOSIS — M25562 Pain in left knee: Secondary | ICD-10-CM | POA: Diagnosis not present

## 2021-09-18 ENCOUNTER — Encounter
Admission: RE | Admit: 2021-09-18 | Discharge: 2021-09-18 | Disposition: A | Discharge status: Home / Self Care | Payer: 59 | Source: Ambulatory Visit | Attending: Orthopedic Surgery | Admitting: Orthopedic Surgery

## 2021-09-18 ENCOUNTER — Other Ambulatory Visit: Payer: Self-pay

## 2021-09-18 NOTE — Patient Instructions (Signed)
Your procedure is scheduled on: 09/24/21 Report to DAY SURGERY DEPARTMENT LOCATED ON 2ND FLOOR MEDICAL MALL ENTRANCE. To find out your arrival time please call [phone] between 1PM - 3PM on 09/21/21.  Remember: Instructions that are not followed completely may result in serious medical risk, up to and including death, or upon the discretion of your surgeon and anesthesiologist your surgery may need to be rescheduled.     _X__ 1. Do not eat food after midnight the night before your procedure.                 No gum chewing or hard candies. You may drink clear liquids up to 2 hours                 before you are scheduled to arrive for your surgery- DO not drink clear                 liquids within 2 hours of the start of your surgery.                 Clear Liquids include:  water, apple juice without pulp, clear carbohydrate                 drink such as Clearfast or Gatorade, Black Coffee or Tea (Do not add                 anything to coffee or tea). Diabetics water only  __X__2.  On the morning of surgery brush your teeth with toothpaste and water, you                 may rinse your mouth with mouthwash if you wish.  Do not swallow any              toothpaste of mouthwash.     _X__ 3.  No Alcohol for 24 hours before or after surgery.   _X__ 4.  Do Not Smoke or use e-cigarettes For 24 Hours Prior to Your Surgery.                 Do not use any chewable tobacco products for at least 6 hours prior to                 surgery.  ____  5.  Bring all medications with you on the day of surgery if instructed.   __X__  6.  Notify your doctor if there is any change in your medical condition      (cold, fever, infections).     Do not wear jewelry, make-up, hairpins, clips or nail polish. Do not wear lotions, powders, or perfumes.  Do not shave body hair 48 hours prior to surgery. Men may shave face and neck. Do not bring valuables to the hospital.    [facility] is not responsible for any  belongings or valuables.  Contacts, dentures/partials or body piercings may not be worn into surgery. Bring a case for your contacts, glasses or hearing aids, a denture cup will be supplied. Leave your suitcase in the car. After surgery it may be brought to your room. For patients admitted to the hospital, discharge time is determined by your treatment team.   Patients discharged the day of surgery will not be allowed to drive home.   Please read over the following fact sheets that you were given:   Chg soap  __X__ Take these medicines the morning of surgery with A SIP OF WATER:  1. FLUoxetine (PROZAC) 40 MG capsule  2. levothyroxine (SYNTHROID) 112 MCG tablet  3. famotidine (PEPCID) 20 MG tablet  4. pantoprazole (PROTONIX) 40 MG tablet  5. ALPRAZolam (XANAX) 0.25 MG tablet if needed  6. HYDROcodone-acetaminophen (NORCO/VICODIN) 5-325 MG tablet  ____ Fleet Enema (as directed)   __X__ Use CHG Soap/SAGE wipes as directed  ____ Use inhalers on the day of surgery  ____ Stop metformin/Janumet/Farxiga 2 days prior to surgery    ____ Take 1/2 of usual insulin dose the night before surgery. No insulin the morning          of surgery.   ____ Stop Blood Thinners Coumadin/Plavix/Xarelto/Pleta/Pradaxa/Eliquis/Effient/Aspirin  on   Or contact your Surgeon, Cardiologist or Medical Doctor regarding  ability to stop your blood thinners  __X__ Stop Anti-inflammatories 7 days before surgery such as Advil, Ibuprofen, Motrin,  BC or Goodies Powder, Naprosyn, Naproxen, Aleve, Aspirin    __X__ Stop all herbal supplements, fish oil or vitamin E until after surgery.    ____ Bring C-Pap to the hospital.

## 2021-09-19 ENCOUNTER — Other Ambulatory Visit: Payer: Self-pay | Admitting: Orthopedic Surgery

## 2021-09-19 ENCOUNTER — Encounter: Payer: Self-pay | Admitting: Urgent Care

## 2021-09-19 ENCOUNTER — Other Ambulatory Visit
Admission: RE | Admit: 2021-09-19 | Discharge: 2021-09-19 | Disposition: A | Discharge status: Home / Self Care | Payer: 59 | Source: Ambulatory Visit | Attending: Orthopedic Surgery | Admitting: Orthopedic Surgery

## 2021-09-19 DIAGNOSIS — I10 Essential (primary) hypertension: Secondary | ICD-10-CM | POA: Diagnosis not present

## 2021-09-19 LAB — BASIC METABOLIC PANEL
Anion gap: 5 (ref 5–15)
BUN: 15 mg/dL (ref 6–20)
CO2: 27 mmol/L (ref 22–32)
Calcium: 8.8 mg/dL — ABNORMAL LOW (ref 8.9–10.3)
Chloride: 106 mmol/L (ref 98–111)
Creatinine, Ser: 0.6 mg/dL (ref 0.44–1.00)
GFR, Estimated: >60 mL/min (ref >60)
Glucose, Bld: 91 mg/dL (ref 70–99)
Potassium: 3.7 mmol/L (ref 3.5–5.1)
Sodium: 138 mmol/L (ref 135–145)

## 2021-09-23 MED ORDER — CHLORHEXIDINE GLUCONATE 0.12 % MT SOLN: 15.0000 mL | Freq: Once | OROMUCOSAL | Status: AC

## 2021-09-23 MED ORDER — ORAL CARE MOUTH RINSE: 15.0000 mL | Freq: Once | OROMUCOSAL | Status: AC

## 2021-09-23 MED ORDER — CLINDAMYCIN PHOSPHATE 900 MG/50ML IV SOLN
900.0000 mg | INTRAVENOUS | Status: AC
  Administered 2021-09-24: 900 mg via INTRAVENOUS

## 2021-09-23 MED ORDER — LACTATED RINGERS IV SOLN: INTRAVENOUS | Status: DC

## 2021-09-24 ENCOUNTER — Ambulatory Visit: Payer: 59 | Admitting: Anesthesiology

## 2021-09-24 ENCOUNTER — Ambulatory Visit: Payer: 59

## 2021-09-24 ENCOUNTER — Other Ambulatory Visit: Payer: Self-pay

## 2021-09-24 ENCOUNTER — Observation Stay
Admission: RE | Admit: 2021-09-24 | Discharge: 2021-09-25 | Disposition: A | Discharge status: Home / Self Care | Payer: 59 | Attending: Orthopedic Surgery | Admitting: Orthopedic Surgery

## 2021-09-24 ENCOUNTER — Encounter: Payer: Self-pay | Admitting: Orthopedic Surgery

## 2021-09-24 ENCOUNTER — Encounter
Admission: RE | Disposition: A | Discharge status: Home / Self Care | Payer: Self-pay | Source: Home / Self Care | Attending: Orthopedic Surgery

## 2021-09-24 ENCOUNTER — Ambulatory Visit: Payer: 59 | Admitting: Urgent Care

## 2021-09-24 DIAGNOSIS — M25561 Pain in right knee: Secondary | ICD-10-CM | POA: Diagnosis not present

## 2021-09-24 DIAGNOSIS — Z419 Encounter for procedure for purposes other than remedying health state, unspecified: Secondary | ICD-10-CM

## 2021-09-24 DIAGNOSIS — X58XXXA Exposure to other specified factors, initial encounter: Secondary | ICD-10-CM | POA: Insufficient documentation

## 2021-09-24 DIAGNOSIS — I10 Essential (primary) hypertension: Secondary | ICD-10-CM | POA: Insufficient documentation

## 2021-09-24 DIAGNOSIS — M7989 Other specified soft tissue disorders: Secondary | ICD-10-CM | POA: Diagnosis not present

## 2021-09-24 DIAGNOSIS — M1711 Unilateral primary osteoarthritis, right knee: Secondary | ICD-10-CM | POA: Insufficient documentation

## 2021-09-24 DIAGNOSIS — S83512A Sprain of anterior cruciate ligament of left knee, initial encounter: Secondary | ICD-10-CM | POA: Diagnosis not present

## 2021-09-24 DIAGNOSIS — Z9889 Other specified postprocedural states: Secondary | ICD-10-CM

## 2021-09-24 DIAGNOSIS — S8991XA Unspecified injury of right lower leg, initial encounter: Secondary | ICD-10-CM | POA: Diagnosis not present

## 2021-09-24 DIAGNOSIS — G8918 Other acute postprocedural pain: Secondary | ICD-10-CM | POA: Diagnosis not present

## 2021-09-24 DIAGNOSIS — E039 Hypothyroidism, unspecified: Secondary | ICD-10-CM | POA: Diagnosis not present

## 2021-09-24 DIAGNOSIS — S83511A Sprain of anterior cruciate ligament of right knee, initial encounter: Principal | ICD-10-CM | POA: Insufficient documentation

## 2021-09-24 DIAGNOSIS — S83241A Other tear of medial meniscus, current injury, right knee, initial encounter: Secondary | ICD-10-CM | POA: Insufficient documentation

## 2021-09-24 DIAGNOSIS — M94261 Chondromalacia, right knee: Secondary | ICD-10-CM | POA: Diagnosis not present

## 2021-09-24 HISTORY — PX: KENALOG INJECTION: SHX5298

## 2021-09-24 HISTORY — PX: KNEE ARTHROSCOPY WITH ANTERIOR CRUCIATE LIGAMENT (ACL) REPAIR WITH HAMSTRING GRAFT: SHX5645

## 2021-09-24 HISTORY — PX: KNEE ARTHROSCOPY WITH MENISCAL REPAIR: SHX5653

## 2021-09-24 LAB — POCT PREGNANCY, URINE: Preg Test, Ur: NEGATIVE

## 2021-09-24 SURGERY — KNEE ARTHROSCOPY WITH ANTERIOR CRUCIATE LIGAMENT (ACL) REPAIR WITH HAMSTRING GRAFT
Anesthesia: General | Site: Knee | Laterality: Right

## 2021-09-24 MED ORDER — GABAPENTIN 300 MG PO CAPS
300.0000 mg | ORAL_CAPSULE | Freq: Once | ORAL | Status: AC
  Administered 2021-09-24: 300 mg via ORAL

## 2021-09-24 MED ORDER — ONDANSETRON 4 MG PO TBDP: 4.0000 mg | ORAL_TABLET | Freq: Three times a day (TID) | ORAL | 0 refills | Status: AC | PRN

## 2021-09-24 MED ORDER — GABAPENTIN 300 MG PO CAPS
ORAL_CAPSULE | ORAL | Status: AC
  Filled 2021-09-24: qty 1

## 2021-09-24 MED ORDER — DROPERIDOL 2.5 MG/ML IJ SOLN
0.6250 mg | Freq: Once | INTRAMUSCULAR | Status: DC | PRN
  Filled 2021-09-24: qty 2

## 2021-09-24 MED ORDER — ACETAMINOPHEN 500 MG PO TABS: 1000.0000 mg | ORAL_TABLET | Freq: Three times a day (TID) | ORAL | 2 refills | Status: DC

## 2021-09-24 MED ORDER — ALPRAZOLAM 0.25 MG PO TABS: 0.2500 mg | ORAL_TABLET | Freq: Two times a day (BID) | ORAL | Status: DC | PRN

## 2021-09-24 MED ORDER — LIDOCAINE HCL (PF) 1 % IJ SOLN
INTRAMUSCULAR | Status: AC
  Filled 2021-09-24: qty 5

## 2021-09-24 MED ORDER — BUPIVACAINE HCL (PF) 0.5 % IJ SOLN
INTRAMUSCULAR | Status: AC
  Filled 2021-09-24: qty 10

## 2021-09-24 MED ORDER — LABETALOL HCL 5 MG/ML IV SOLN
INTRAVENOUS | Status: DC | PRN
  Administered 2021-09-24 (×2): 2.5 mg via INTRAVENOUS

## 2021-09-24 MED ORDER — LACTATED RINGERS IV SOLN
INTRAVENOUS | Status: DC | PRN
  Administered 2021-09-24 (×4): 3000 mL

## 2021-09-24 MED ORDER — PANTOPRAZOLE SODIUM 40 MG PO TBEC: 40.0000 mg | DELAYED_RELEASE_TABLET | Freq: Every day | ORAL | Status: DC

## 2021-09-24 MED ORDER — BUPIVACAINE-EPINEPHRINE (PF) 0.5% -1:200000 IJ SOLN
INTRAMUSCULAR | Status: AC
  Filled 2021-09-24: qty 30

## 2021-09-24 MED ORDER — SODIUM CHLORIDE 0.9 % IR SOLN
Status: DC | PRN
  Administered 2021-09-24: 200 mL

## 2021-09-24 MED ORDER — FENTANYL CITRATE (PF) 100 MCG/2ML IJ SOLN
INTRAMUSCULAR | Status: AC
  Administered 2021-09-24: 50 ug via INTRAVENOUS
  Filled 2021-09-24: qty 2

## 2021-09-24 MED ORDER — KETOROLAC TROMETHAMINE 30 MG/ML IJ SOLN
INTRAMUSCULAR | Status: AC
  Filled 2021-09-24: qty 1

## 2021-09-24 MED ORDER — BUPIVACAINE HCL (PF) 0.5 % IJ SOLN
INTRAMUSCULAR | Status: AC
  Filled 2021-09-24: qty 30

## 2021-09-24 MED ORDER — HYDROMORPHONE HCL 1 MG/ML IJ SOLN
INTRAMUSCULAR | Status: DC | PRN
  Administered 2021-09-24 (×2): .5 mg via INTRAVENOUS

## 2021-09-24 MED ORDER — ACETAMINOPHEN 10 MG/ML IV SOLN: 1000.0000 mg | Freq: Once | INTRAVENOUS | Status: DC | PRN

## 2021-09-24 MED ORDER — FENTANYL CITRATE (PF) 100 MCG/2ML IJ SOLN
INTRAMUSCULAR | Status: AC
  Filled 2021-09-24: qty 2

## 2021-09-24 MED ORDER — OXYCODONE HCL 5 MG PO TABS
ORAL_TABLET | ORAL | Status: AC
  Filled 2021-09-24: qty 1

## 2021-09-24 MED ORDER — OXYCODONE HCL 5 MG PO TABS
5.0000 mg | ORAL_TABLET | Freq: Once | ORAL | Status: AC | PRN
  Administered 2021-09-24: 5 mg via ORAL

## 2021-09-24 MED ORDER — PROMETHAZINE HCL 25 MG/ML IJ SOLN
INTRAMUSCULAR | Status: AC
  Administered 2021-09-24: 6.25 mg via INTRAVENOUS
  Filled 2021-09-24: qty 1

## 2021-09-24 MED ORDER — ROCURONIUM BROMIDE 100 MG/10ML IV SOLN
INTRAVENOUS | Status: DC | PRN
  Administered 2021-09-24: 50 mg via INTRAVENOUS

## 2021-09-24 MED ORDER — BISACODYL 10 MG RE SUPP: 10.0000 mg | Freq: Every day | RECTAL | Status: DC | PRN

## 2021-09-24 MED ORDER — DEXAMETHASONE SODIUM PHOSPHATE 10 MG/ML IJ SOLN
INTRAMUSCULAR | Status: DC | PRN
  Administered 2021-09-24: 6 mg via INTRAVENOUS

## 2021-09-24 MED ORDER — MIDAZOLAM HCL 2 MG/2ML IJ SOLN
INTRAMUSCULAR | Status: AC
  Filled 2021-09-24: qty 2

## 2021-09-24 MED ORDER — SENNOSIDES-DOCUSATE SODIUM 8.6-50 MG PO TABS: 1.0000 | ORAL_TABLET | Freq: Every evening | ORAL | Status: DC | PRN

## 2021-09-24 MED ORDER — ACETAMINOPHEN 10 MG/ML IV SOLN
INTRAVENOUS | Status: DC | PRN
  Administered 2021-09-24: 1000 mg via INTRAVENOUS

## 2021-09-24 MED ORDER — PROPOFOL 10 MG/ML IV BOLUS
INTRAVENOUS | Status: DC | PRN
  Administered 2021-09-24: 200 mg via INTRAVENOUS

## 2021-09-24 MED ORDER — PHENTERMINE HCL 37.5 MG PO TABS: 37.5000 mg | ORAL_TABLET | Freq: Every day | ORAL | Status: DC

## 2021-09-24 MED ORDER — ASPIRIN EC 325 MG PO TBEC
325.0000 mg | DELAYED_RELEASE_TABLET | Freq: Every day | ORAL | Status: DC
  Administered 2021-09-25: 325 mg via ORAL
  Filled 2021-09-24: qty 1

## 2021-09-24 MED ORDER — HYDROCHLOROTHIAZIDE 12.5 MG PO TABS
12.5000 mg | ORAL_TABLET | Freq: Every day | ORAL | Status: DC
  Filled 2021-09-24: qty 1

## 2021-09-24 MED ORDER — KETOROLAC TROMETHAMINE 30 MG/ML IJ SOLN
30.0000 mg | Freq: Once | INTRAMUSCULAR | Status: AC
  Administered 2021-09-24: 30 mg via INTRAVENOUS

## 2021-09-24 MED ORDER — SODIUM CHLORIDE FLUSH 0.9 % IV SOLN
INTRAVENOUS | Status: AC
  Filled 2021-09-24: qty 10

## 2021-09-24 MED ORDER — OXYCODONE HCL 5 MG PO TABS: 5.0000 mg | ORAL_TABLET | Freq: Once | ORAL | Status: AC

## 2021-09-24 MED ORDER — HYDROMORPHONE HCL 1 MG/ML IJ SOLN
INTRAMUSCULAR | Status: AC
  Filled 2021-09-24: qty 1

## 2021-09-24 MED ORDER — IBUPROFEN 800 MG PO TABS: 800.0000 mg | ORAL_TABLET | Freq: Three times a day (TID) | ORAL | 1 refills | Status: AC

## 2021-09-24 MED ORDER — MIDAZOLAM HCL 2 MG/2ML IJ SOLN
INTRAMUSCULAR | Status: DC | PRN
  Administered 2021-09-24: 2 mg via INTRAVENOUS

## 2021-09-24 MED ORDER — ONDANSETRON HCL 4 MG/2ML IJ SOLN
INTRAMUSCULAR | Status: AC
  Filled 2021-09-24: qty 2

## 2021-09-24 MED ORDER — VANCOMYCIN HCL 1000 MG IV SOLR
INTRAVENOUS | Status: DC | PRN
  Administered 2021-09-24: 1000 mg

## 2021-09-24 MED ORDER — ADULT MULTIVITAMIN W/MINERALS CH
1.0000 | ORAL_TABLET | Freq: Every day | ORAL | Status: DC
  Administered 2021-09-25: 1 via ORAL
  Filled 2021-09-24: qty 1

## 2021-09-24 MED ORDER — CLINDAMYCIN PHOSPHATE 900 MG/50ML IV SOLN
INTRAVENOUS | Status: AC
  Administered 2021-09-24: 900 mg via INTRAVENOUS
  Filled 2021-09-24: qty 50

## 2021-09-24 MED ORDER — ACETAMINOPHEN 500 MG PO TABS
1000.0000 mg | ORAL_TABLET | Freq: Three times a day (TID) | ORAL | Status: DC
  Administered 2021-09-24 – 2021-09-25 (×2): 1000 mg via ORAL
  Filled 2021-09-24 (×2): qty 2

## 2021-09-24 MED ORDER — VITAMIN D (ERGOCALCIFEROL) 1.25 MG (50000 UNIT) PO CAPS: 50000.0000 [IU] | ORAL_CAPSULE | ORAL | Status: DC

## 2021-09-24 MED ORDER — LIDOCAINE HCL (PF) 2 % IJ SOLN
INTRAMUSCULAR | Status: AC
  Filled 2021-09-24: qty 5

## 2021-09-24 MED ORDER — PROMETHAZINE HCL 25 MG/ML IJ SOLN: 6.2500 mg | INTRAMUSCULAR | Status: DC | PRN

## 2021-09-24 MED ORDER — METOCLOPRAMIDE HCL 10 MG PO TABS: 5.0000 mg | ORAL_TABLET | Freq: Three times a day (TID) | ORAL | Status: DC | PRN

## 2021-09-24 MED ORDER — ONDANSETRON HCL 4 MG/2ML IJ SOLN: 4.0000 mg | Freq: Four times a day (QID) | INTRAMUSCULAR | Status: DC | PRN

## 2021-09-24 MED ORDER — SODIUM CHLORIDE 0.9 % IV SOLN: INTRAVENOUS | Status: DC

## 2021-09-24 MED ORDER — GABAPENTIN 300 MG PO CAPS: 300.0000 mg | ORAL_CAPSULE | Freq: Three times a day (TID) | ORAL | 0 refills | Status: DC

## 2021-09-24 MED ORDER — LABETALOL HCL 5 MG/ML IV SOLN
INTRAVENOUS | Status: AC
  Filled 2021-09-24: qty 4

## 2021-09-24 MED ORDER — DEXAMETHASONE SODIUM PHOSPHATE 10 MG/ML IJ SOLN
INTRAMUSCULAR | Status: AC
  Filled 2021-09-24: qty 1

## 2021-09-24 MED ORDER — LACTATED RINGERS IR SOLN
Status: DC | PRN
  Administered 2021-09-24 (×4): 3000 mL

## 2021-09-24 MED ORDER — CLINDAMYCIN PHOSPHATE 900 MG/50ML IV SOLN
900.0000 mg | Freq: Four times a day (QID) | INTRAVENOUS | Status: AC
  Administered 2021-09-25 (×2): 900 mg via INTRAVENOUS
  Filled 2021-09-24 (×3): qty 50

## 2021-09-24 MED ORDER — LIDOCAINE-EPINEPHRINE 1 %-1:100000 IJ SOLN
INTRAMUSCULAR | Status: DC | PRN
  Administered 2021-09-24: 20 mL via INTRAMUSCULAR

## 2021-09-24 MED ORDER — LEVOTHYROXINE SODIUM 112 MCG PO TABS
112.0000 ug | ORAL_TABLET | Freq: Every day | ORAL | Status: DC
  Administered 2021-09-25: 112 ug via ORAL
  Filled 2021-09-24: qty 1

## 2021-09-24 MED ORDER — OXYCODONE HCL 5 MG PO TABS
5.0000 mg | ORAL_TABLET | Freq: Once | ORAL | Status: DC
  Administered 2021-09-24: 5 mg via ORAL

## 2021-09-24 MED ORDER — HYDROMORPHONE HCL 1 MG/ML IJ SOLN
INTRAMUSCULAR | Status: AC
  Administered 2021-09-24: 0.5 mg via INTRAVENOUS
  Filled 2021-09-24: qty 1

## 2021-09-24 MED ORDER — DIAZEPAM 5 MG PO TABS
5.0000 mg | ORAL_TABLET | Freq: Three times a day (TID) | ORAL | Status: DC | PRN
  Administered 2021-09-24: 5 mg via ORAL
  Filled 2021-09-24: qty 1

## 2021-09-24 MED ORDER — FLEET ENEMA 7-19 GM/118ML RE ENEM: 1.0000 | ENEMA | Freq: Once | RECTAL | Status: DC | PRN

## 2021-09-24 MED ORDER — TRIAMCINOLONE ACETONIDE 40 MG/ML IJ SUSP
INTRAMUSCULAR | Status: AC
  Filled 2021-09-24: qty 1

## 2021-09-24 MED ORDER — KETOROLAC TROMETHAMINE 15 MG/ML IJ SOLN
15.0000 mg | Freq: Four times a day (QID) | INTRAMUSCULAR | Status: DC
  Administered 2021-09-24 – 2021-09-25 (×3): 15 mg via INTRAVENOUS
  Filled 2021-09-24 (×3): qty 1

## 2021-09-24 MED ORDER — DEXMEDETOMIDINE (PRECEDEX) IN NS 20 MCG/5ML (4 MCG/ML) IV SYRINGE
PREFILLED_SYRINGE | INTRAVENOUS | Status: DC | PRN
  Administered 2021-09-24: 12 ug via INTRAVENOUS
  Administered 2021-09-24: 8 ug via INTRAVENOUS

## 2021-09-24 MED ORDER — TRIAMCINOLONE ACETONIDE 40 MG/ML IJ SUSP
INTRAMUSCULAR | Status: DC | PRN
  Administered 2021-09-24: 5 mL via INTRAMUSCULAR

## 2021-09-24 MED ORDER — EPINEPHRINE PF 1 MG/ML IJ SOLN
INTRAMUSCULAR | Status: AC
  Filled 2021-09-24: qty 4

## 2021-09-24 MED ORDER — VANCOMYCIN HCL 1000 MG IV SOLR
INTRAVENOUS | Status: AC
  Filled 2021-09-24: qty 20

## 2021-09-24 MED ORDER — LIDOCAINE HCL (PF) 1 % IJ SOLN
INTRAMUSCULAR | Status: AC
  Filled 2021-09-24: qty 30

## 2021-09-24 MED ORDER — TRAMADOL HCL 50 MG PO TABS
50.0000 mg | ORAL_TABLET | ORAL | Status: DC
  Administered 2021-09-24 – 2021-09-25 (×5): 50 mg via ORAL
  Filled 2021-09-24 (×5): qty 1

## 2021-09-24 MED ORDER — FENTANYL CITRATE (PF) 100 MCG/2ML IJ SOLN
25.0000 ug | INTRAMUSCULAR | Status: DC | PRN
  Administered 2021-09-24: 50 ug via INTRAVENOUS
  Administered 2021-09-24: 25 ug via INTRAVENOUS
  Administered 2021-09-24: 50 ug via INTRAVENOUS

## 2021-09-24 MED ORDER — ONDANSETRON HCL 4 MG PO TABS: 4.0000 mg | ORAL_TABLET | Freq: Four times a day (QID) | ORAL | Status: DC | PRN

## 2021-09-24 MED ORDER — ACETAMINOPHEN 10 MG/ML IV SOLN
INTRAVENOUS | Status: AC
  Administered 2021-09-24: 1000 mg via INTRAVENOUS
  Filled 2021-09-24: qty 100

## 2021-09-24 MED ORDER — HYDROMORPHONE HCL 1 MG/ML IJ SOLN
0.5000 mg | INTRAMUSCULAR | Status: DC | PRN
  Administered 2021-09-24 – 2021-09-25 (×3): 1 mg via INTRAVENOUS
  Filled 2021-09-24 (×3): qty 1

## 2021-09-24 MED ORDER — OXYCODONE HCL 5 MG PO TABS
5.0000 mg | ORAL_TABLET | ORAL | Status: DC | PRN
  Filled 2021-09-24: qty 2

## 2021-09-24 MED ORDER — CHLORHEXIDINE GLUCONATE 0.12 % MT SOLN
OROMUCOSAL | Status: AC
  Administered 2021-09-24: 15 mL via OROMUCOSAL
  Filled 2021-09-24: qty 15

## 2021-09-24 MED ORDER — OXYCODONE HCL 5 MG PO TABS
ORAL_TABLET | ORAL | Status: AC
  Administered 2021-09-24: 5 mg via ORAL
  Filled 2021-09-24: qty 1

## 2021-09-24 MED ORDER — DOCUSATE SODIUM 100 MG PO CAPS
100.0000 mg | ORAL_CAPSULE | Freq: Two times a day (BID) | ORAL | Status: DC
  Administered 2021-09-24 – 2021-09-25 (×2): 100 mg via ORAL
  Filled 2021-09-24 (×2): qty 1

## 2021-09-24 MED ORDER — FAMOTIDINE 20 MG PO TABS
ORAL_TABLET | ORAL | Status: AC
  Filled 2021-09-24: qty 1

## 2021-09-24 MED ORDER — PHENYLEPHRINE HCL (PRESSORS) 10 MG/ML IV SOLN
INTRAVENOUS | Status: AC
  Filled 2021-09-24: qty 1

## 2021-09-24 MED ORDER — ASPIRIN EC 325 MG PO TBEC: 325.0000 mg | DELAYED_RELEASE_TABLET | Freq: Every day | ORAL | 0 refills | Status: AC

## 2021-09-24 MED ORDER — ROCURONIUM BROMIDE 10 MG/ML (PF) SYRINGE
PREFILLED_SYRINGE | INTRAVENOUS | Status: AC
  Filled 2021-09-24: qty 10

## 2021-09-24 MED ORDER — FENTANYL CITRATE (PF) 100 MCG/2ML IJ SOLN
INTRAMUSCULAR | Status: DC | PRN
  Administered 2021-09-24 (×4): 50 ug via INTRAVENOUS

## 2021-09-24 MED ORDER — OXYCODONE HCL 5 MG PO TABS: 5.0000 mg | ORAL_TABLET | ORAL | 0 refills | Status: DC | PRN

## 2021-09-24 MED ORDER — FAMOTIDINE 20 MG PO TABS
20.0000 mg | ORAL_TABLET | Freq: Every day | ORAL | Status: DC
  Administered 2021-09-24 – 2021-09-25 (×2): 20 mg via ORAL
  Filled 2021-09-24 (×2): qty 1

## 2021-09-24 MED ORDER — SUGAMMADEX SODIUM 200 MG/2ML IV SOLN
INTRAVENOUS | Status: DC | PRN
  Administered 2021-09-24: 200 mg via INTRAVENOUS

## 2021-09-24 MED ORDER — BUPIVACAINE HCL (PF) 0.5 % IJ SOLN
10.0000 mL | Freq: Once | INTRAMUSCULAR | Status: AC
  Administered 2021-09-24: 20 mL
  Administered 2021-09-24: 10 mL

## 2021-09-24 MED ORDER — PROPOFOL 10 MG/ML IV BOLUS
INTRAVENOUS | Status: AC
  Filled 2021-09-24: qty 40

## 2021-09-24 MED ORDER — METOCLOPRAMIDE HCL 5 MG/ML IJ SOLN: 5.0000 mg | Freq: Three times a day (TID) | INTRAMUSCULAR | Status: DC | PRN

## 2021-09-24 MED ORDER — HYDROMORPHONE HCL 1 MG/ML IJ SOLN
0.5000 mg | INTRAMUSCULAR | Status: DC | PRN
  Administered 2021-09-24: 0.5 mg via INTRAVENOUS

## 2021-09-24 MED ORDER — DIAZEPAM 5 MG PO TABS: 5.0000 mg | ORAL_TABLET | Freq: Three times a day (TID) | ORAL | 0 refills | Status: DC | PRN

## 2021-09-24 MED ORDER — OXYCODONE HCL 5 MG/5ML PO SOLN: 5.0000 mg | Freq: Once | ORAL | Status: AC | PRN

## 2021-09-24 MED ORDER — ACETAMINOPHEN 10 MG/ML IV SOLN
INTRAVENOUS | Status: AC
  Filled 2021-09-24: qty 100

## 2021-09-24 MED ORDER — ONDANSETRON HCL 4 MG/2ML IJ SOLN
INTRAMUSCULAR | Status: DC | PRN
  Administered 2021-09-24: 4 mg via INTRAVENOUS

## 2021-09-24 MED ORDER — DEXMEDETOMIDINE HCL IN NACL 200 MCG/50ML IV SOLN
INTRAVENOUS | Status: AC
  Filled 2021-09-24: qty 50

## 2021-09-24 MED ORDER — FLUOXETINE HCL 20 MG PO CAPS
40.0000 mg | ORAL_CAPSULE | Freq: Every day | ORAL | Status: DC
  Administered 2021-09-25: 40 mg via ORAL
  Filled 2021-09-24 (×2): qty 2

## 2021-09-24 MED ORDER — CLINDAMYCIN PHOSPHATE 900 MG/50ML IV SOLN
INTRAVENOUS | Status: AC
  Filled 2021-09-24: qty 50

## 2021-09-24 MED ORDER — LIDOCAINE HCL (CARDIAC) PF 100 MG/5ML IV SOSY
PREFILLED_SYRINGE | INTRAVENOUS | Status: DC | PRN
  Administered 2021-09-24: 100 mg via INTRAVENOUS

## 2021-09-24 MED ORDER — CYANOCOBALAMIN 1000 MCG/ML IJ SOLN: 1000.0000 ug | INTRAMUSCULAR | Status: DC

## 2021-09-24 MED ORDER — LIDOCAINE-EPINEPHRINE 1 %-1:100000 IJ SOLN
INTRAMUSCULAR | Status: AC
  Filled 2021-09-24: qty 1

## 2021-09-24 MED ORDER — GABAPENTIN 300 MG PO CAPS
300.0000 mg | ORAL_CAPSULE | Freq: Three times a day (TID) | ORAL | Status: DC
  Administered 2021-09-24 – 2021-09-25 (×3): 300 mg via ORAL
  Filled 2021-09-24 (×3): qty 1

## 2021-09-24 MED ORDER — OXYCODONE HCL 5 MG PO TABS
10.0000 mg | ORAL_TABLET | ORAL | Status: DC | PRN
  Administered 2021-09-25: 10 mg via ORAL

## 2021-09-24 SURGICAL SUPPLY — 113 items
ADAPTER IRRIG TUBE 2 SPIKE SOL (ADAPTER) ×7 IMPLANT
ADH SKN CLS APL DERMABOND .7 (GAUZE/BANDAGES/DRESSINGS) ×2
ADPR TBG 2 SPK PMP STRL ASCP (ADAPTER) ×2
ANCHOR BUTTON TIGHTROPE RC 20 (Anchor) ×1 IMPLANT
APL PRP STRL LF DISP 70% ISPRP (MISCELLANEOUS) ×6
BAG DECANTER FOR FLEXI CONT (MISCELLANEOUS) ×1 IMPLANT
BASIN GRAD PLASTIC 32OZ STRL (MISCELLANEOUS) ×4 IMPLANT
BLADE SHAVER 4.5X7 STR FR (MISCELLANEOUS) ×4 IMPLANT
BLADE SURG 15 STRL LF DISP TIS (BLADE) ×6 IMPLANT
BLADE SURG 15 STRL SS (BLADE) ×6
BLADE SURG SZ10 CARB STEEL (BLADE) ×4 IMPLANT
BLADE SURG SZ11 CARB STEEL (BLADE) ×4 IMPLANT
BNDG COHESIVE 4X5 TAN ST LF (GAUZE/BANDAGES/DRESSINGS) ×4 IMPLANT
BNDG COHESIVE 6X5 TAN ST LF (GAUZE/BANDAGES/DRESSINGS) ×4 IMPLANT
BNDG ESMARK 6X12 TAN STRL LF (GAUZE/BANDAGES/DRESSINGS) ×4 IMPLANT
BRUSH SCRUB EZ  4% CHG (MISCELLANEOUS) ×3
BRUSH SCRUB EZ 4% CHG (MISCELLANEOUS) ×3 IMPLANT
BUR BR 5.5 WIDE MOUTH (BURR) ×1 IMPLANT
CARTRIDGE SUT 2-0 NONSTITCH (Anchor) ×1 IMPLANT
CHLORAPREP W/TINT 26 (MISCELLANEOUS) ×9 IMPLANT
CLEANER CAUTERY TIP 5X5 PAD (MISCELLANEOUS) ×3 IMPLANT
COOLER POLAR GLACIER W/PUMP (MISCELLANEOUS) ×4 IMPLANT
COVER BACK TABLE REUSABLE LG (DRAPES) ×4 IMPLANT
CUFF TOURN SGL QUICK 24 (TOURNIQUET CUFF)
CUFF TOURN SGL QUICK 34 (TOURNIQUET CUFF)
CUFF TRNQT CYL 24X4X16.5-23 (TOURNIQUET CUFF) IMPLANT
CUFF TRNQT CYL 34X4.125X (TOURNIQUET CUFF) IMPLANT
DERMABOND ADVANCED (GAUZE/BANDAGES/DRESSINGS) ×1
DERMABOND ADVANCED .7 DNX12 (GAUZE/BANDAGES/DRESSINGS) IMPLANT
DEVICE MENISCAL CVD UP (Anchor) ×15 IMPLANT
DRAPE 3/4 80X56 (DRAPES) ×8 IMPLANT
DRAPE ARTHRO LIMB 89X125 STRL (DRAPES) ×8 IMPLANT
DRAPE FLUOR MINI C-ARM 54X84 (DRAPES) ×4 IMPLANT
DRAPE IMP U-DRAPE 54X76 (DRAPES) ×4 IMPLANT
DRAPE ORTHO SPLIT 77X108 STRL (DRAPES) ×3
DRAPE POUCH INSTRU U-SHP 10X18 (DRAPES) ×4 IMPLANT
DRAPE SURG ORHT 6 SPLT 77X108 (DRAPES) ×3 IMPLANT
DRILL FLIPCUTTER III 6-12 (ORTHOPEDIC DISPOSABLE SUPPLIES) IMPLANT
ELECT REM PT RETURN 9FT ADLT (ELECTROSURGICAL) ×3
ELECTRODE REM PT RTRN 9FT ADLT (ELECTROSURGICAL) ×3 IMPLANT
FLIPCUTTER III 6-12 AR-1204FF (ORTHOPEDIC DISPOSABLE SUPPLIES) ×3
GAUZE SPONGE 4X4 12PLY STRL (GAUZE/BANDAGES/DRESSINGS) ×4 IMPLANT
GAUZE XEROFORM 1X8 LF (GAUZE/BANDAGES/DRESSINGS) ×4 IMPLANT
GLOVE SRG 8 PF TXTR STRL LF DI (GLOVE) ×3 IMPLANT
GLOVE SURG SYN 8.0 (GLOVE) ×3 IMPLANT
GLOVE SURG SYN 8.0 PF PI (GLOVE) ×3 IMPLANT
GLOVE SURG UNDER POLY LF SZ8 (GLOVE) ×3
GOWN STRL REUS W/ TWL LRG LVL3 (GOWN DISPOSABLE) ×3 IMPLANT
GOWN STRL REUS W/ TWL XL LVL3 (GOWN DISPOSABLE) ×3 IMPLANT
GOWN STRL REUS W/TWL LRG LVL3 (GOWN DISPOSABLE) ×3
GOWN STRL REUS W/TWL XL LVL3 (GOWN DISPOSABLE) ×3
GRADUATE 1200CC STRL 31836 (MISCELLANEOUS) ×3 IMPLANT
GUIDEWIRE 1.2MMX18 (WIRE) ×4 IMPLANT
HANDLE YANKAUER SUCT BULB TIP (MISCELLANEOUS) ×4 IMPLANT
IMP SYS 2ND FIX PEEK 4.75X19.1 (Miscellaneous) ×3 IMPLANT
IMPL SYS 2ND FX PEEK 4.75X19.1 (Miscellaneous) IMPLANT
IMPL TIGHTROP ABS ACL FIBERTG (Orthopedic Implant) IMPLANT
IMPL TIGHTROP FIBERTAG ACL (Orthopedic Implant) IMPLANT
IMPL TIGHTROPE ABS ACL FIBERTG (Orthopedic Implant) ×3 IMPLANT
IMPLANT TIGHTROPE FIBERTAG ACL (Orthopedic Implant) ×3 IMPLANT
IV LACTATED RINGER IRRG 3000ML (IV SOLUTION) ×24
IV LR IRRIG 3000ML ARTHROMATIC (IV SOLUTION) ×18 IMPLANT
KIT TRANSTIBIAL (DISPOSABLE) ×1 IMPLANT
KIT TURNOVER KIT A (KITS) ×4 IMPLANT
KNIFE BLADE PARALLEL SZ10 (BLADE) ×1 IMPLANT
MANAGER SUT NOVOCUT (CUTTER) ×1 IMPLANT
MANIFOLD NEPTUNE II (INSTRUMENTS) ×8 IMPLANT
MAT ABSORB  FLUID 56X50 GRAY (MISCELLANEOUS) ×6
MAT ABSORB FLUID 56X50 GRAY (MISCELLANEOUS) ×6 IMPLANT
NDL SUT 2-0 SCORPION KNEE (NEEDLE) IMPLANT
NEEDLE HYPO 22GX1.5 SAFETY (NEEDLE) ×4 IMPLANT
NEEDLE SUT 2-0 SCORPION KNEE (NEEDLE) ×3 IMPLANT
NOVOSTICH PRO MENISCAL 2-0 (Miscellaneous) ×3 IMPLANT
PACK ARTHROSCOPY KNEE (MISCELLANEOUS) ×4 IMPLANT
PAD ABD DERMACEA PRESS 5X9 (GAUZE/BANDAGES/DRESSINGS) ×8 IMPLANT
PAD CLEANER CAUTERY TIP 5X5 (MISCELLANEOUS)
PAD WRAPON POLAR KNEE (MISCELLANEOUS) ×3 IMPLANT
PAD WRAPON POLOR MULTI XL (MISCELLANEOUS) IMPLANT
PENCIL ELECTRO HAND CTR (MISCELLANEOUS) ×4 IMPLANT
PENCIL SMOKE EVACUATOR (MISCELLANEOUS) ×4 IMPLANT
SCREW BIO FULL THREADED 10X28 (Screw) ×1 IMPLANT
SHAVER BLADE BONE CUTTER  5.5 (BLADE)
SHAVER BLADE BONE CUTTER 5.5 (BLADE) IMPLANT
SPONGE T-LAP 18X18 ~~LOC~~+RFID (SPONGE) ×12 IMPLANT
STRIP CLOSURE SKIN 1/2X4 (GAUZE/BANDAGES/DRESSINGS) ×4 IMPLANT
SUCTION FRAZIER HANDLE 10FR (MISCELLANEOUS)
SUCTION TUBE FRAZIER 10FR DISP (MISCELLANEOUS) IMPLANT
SUT ETHILON 3-0 FS-10 30 BLK (SUTURE) ×3
SUT FIBERSNARE 2 CLSD LOOP (SUTURE) ×1 IMPLANT
SUT FIBERWIRE #2 38 T-5 BLUE (SUTURE) ×6
SUT MNCRL 4-0 (SUTURE) ×3
SUT MNCRL 4-0 27XMFL (SUTURE) ×2
SUT MNCRL AB 4-0 PS2 18 (SUTURE) ×4 IMPLANT
SUT VIC AB 0 CT1 36 (SUTURE) ×4 IMPLANT
SUT VIC AB 2-0 CT1 27 (SUTURE) ×3
SUT VIC AB 2-0 CT1 TAPERPNT 27 (SUTURE) ×3 IMPLANT
SUT VIC AB 2-0 CT2 27 (SUTURE) ×2 IMPLANT
SUTURE EHLN 3-0 FS-10 30 BLK (SUTURE) ×3 IMPLANT
SUTURE FIBERWR #2 38 T-5 BLUE (SUTURE) ×6 IMPLANT
SUTURE MNCRL 4-0 27XMF (SUTURE) IMPLANT
SUTURE TAPE FIBERLINK 1.3 LOOP (SUTURE) IMPLANT
SUTURETAPE FIBERLINK 1.3 LOOP (SUTURE) ×3
SYR BULB IRRIG 60ML STRL (SYRINGE) ×4 IMPLANT
SYS ANCHOR SUT W/2-0 BLU CO-BR (Anchor) IMPLANT
SYSTEM NVSTCH PRO MENISCAL 2-0 (Miscellaneous) IMPLANT
TRAY FOLEY SLVR 16FR LF STAT (SET/KITS/TRAYS/PACK) ×3 IMPLANT
TUBING INFLOW SET DBFLO PUMP (TUBING) ×4 IMPLANT
TUBING OUTFLOW SET DBLFO PUMP (TUBING) ×4 IMPLANT
WAND WEREWOLF FLOW 90D (MISCELLANEOUS) IMPLANT
WATER STERILE IRR 500ML POUR (IV SOLUTION) ×4 IMPLANT
WRAP-ON POLOR PAD MULTI XL (MISCELLANEOUS) ×2
WRAPON POLAR PAD KNEE (MISCELLANEOUS)
WRAPON POLOR PAD MULTI XL (MISCELLANEOUS) ×3

## 2021-09-24 NOTE — H&P (Signed)
Paper H&P to be scanned into permanent record. H&P reviewed. No significant changes noted.  

## 2021-09-24 NOTE — Anesthesia Procedure Notes (Addendum)
Anesthesia Regional Block: Femoral nerve block   Pre-Anesthetic Checklist: , timeout performed,  Correct Patient, Correct Site, Correct Laterality,  Correct Procedure, Correct Position, site marked,  Risks and benefits discussed,  Surgical consent,  Pre-op evaluation,  At surgeon's request and post-op pain management  Laterality: Right  Prep: chloraprep       Needles:  Injection technique: Single-shot  Needle Type: Stimiplex     Needle Length: 9cm  Needle Gauge: 22     Additional Needles:   Procedures:,,,, ultrasound used (permanent image in chart),,    Narrative:  Start time: 09/24/2021 11:26 AM End time: 09/24/2021 11:28 AM Injection made incrementally with aspirations every 20 mL.  Performed by: Personally  Anesthesiologist: Foye Deer, MD  Additional Notes: Patient consented for risk and benefits of nerve block including but not limited to nerve damage, failed block, bleeding and infection.  Patient voiced understanding.  Functioning IV was confirmed and monitors were applied.  Timeout done prior to procedure and prior to any sedation being given to the patient.  Patient confirmed procedure site prior to any sedation given to the patient.  A 68mm 22ga Stimuplex needle was used. Sterile prep,hand hygiene and sterile gloves were used.  Minimal sedation used for procedure.  No paresthesia endorsed by patient during the procedure.  Negative aspiration and negative test dose prior to incremental administration of local anesthetic. The patient tolerated the procedure well with no immediate complications.  Consent obtained pre-op

## 2021-09-24 NOTE — Transfer of Care (Signed)
Immediate Anesthesia Transfer of Care Note  Patient: Taylor [name]  Procedure(s) Performed: Right arthroscopic ACL reconstruction using quadriceps tendon autograft, chondroplasty (Right: Knee) KENALOG INJECTION (Left: Knee) KNEE ARTHROSCOPY WITH MEDIAL MENISCAL REPAIR (Left: Knee)  Patient Location: PACU  Anesthesia Type:General  Level of Consciousness: awake, alert  and oriented  Airway & Oxygen Therapy: Patient Spontanous Breathing and Patient connected to face mask oxygen  Post-op Assessment: Report given to RN and Post -op Vital signs reviewed and stable  Post vital signs: Reviewed and stable  Last Vitals:  Vitals Value Taken Time  BP 155/97 09/24/21 1115  Temp    Pulse 82 09/24/21 1118  Resp 14 09/24/21 1118  SpO2 100 % 09/24/21 1118  Vitals shown include unvalidated device data.  Last Pain:  Vitals:   09/24/21 0636  PainSc: 5          Complications: No notable events documented.

## 2021-09-24 NOTE — Anesthesia Preprocedure Evaluation (Addendum)
Anesthesia Evaluation  Patient identified by MRN, date of birth, ID band Patient awake    Reviewed: Allergy & Precautions, NPO status , Patient's Chart, lab work & pertinent test results  History of Anesthesia Complications Negative for: history of anesthetic complications  Airway Mallampati: II  TM Distance: >3 FB Neck ROM: Full    Dental no notable dental hx.    Pulmonary neg COPD, former smoker,    breath sounds clear to auscultation- rhonchi (-) wheezing      Cardiovascular Exercise Tolerance: Good hypertension, (-) CAD and (-) Past MI  Rhythm:Regular Rate:Normal - Systolic murmurs and - Diastolic murmurs    Neuro/Psych PSYCHIATRIC DISORDERS Anxiety Depression negative neurological ROS     GI/Hepatic Neg liver ROS, GERD  ,S/p gastric sleeve   Endo/Other  neg diabetesHypothyroidism Morbid obesity  Renal/GU negative Renal ROS     Musculoskeletal negative musculoskeletal ROS (+)   Abdominal (+) + obese,   Peds  Hematology negative hematology ROS (+)   Anesthesia Other Findings   Reproductive/Obstetrics                             Anesthesia Physical  Anesthesia Plan  ASA: 3  Anesthesia Plan: General   Post-op Pain Management:    Induction: Intravenous  PONV Risk Score and Plan: 3 and Ondansetron, Dexamethasone and Midazolam  Airway Management Planned: Oral ETT  Additional Equipment:   Intra-op Plan:   Post-operative Plan: Extubation in OR  Informed Consent: I have reviewed the patients History and Physical, chart, labs and discussed the procedure including the risks, benefits and alternatives for the proposed anesthesia with the patient or authorized representative who has indicated his/her understanding and acceptance.     Dental advisory given  Plan Discussed with: CRNA  Anesthesia Plan Comments: (Consented for post-op pain block)      Anesthesia Quick  Evaluation

## 2021-09-24 NOTE — Anesthesia Procedure Notes (Signed)
Procedure Name: Intubation Date/Time: 09/24/2021 7:43 AM Performed by: Gilford Raid, CRNA Pre-anesthesia Checklist: Patient identified, Patient being monitored, Timeout performed, Emergency Drugs available and Suction available Patient Re-evaluated:Patient Re-evaluated prior to induction Oxygen Delivery Method: Circle system utilized Preoxygenation: Pre-oxygenation with 100% oxygen Induction Type: IV induction Ventilation: Mask ventilation without difficulty Laryngoscope Size: Miller and 2 Grade View: Grade I Tube type: Oral Tube size: 7.0 mm Number of attempts: 1 Placement Confirmation: ETT inserted through vocal cords under direct vision, positive ETCO2 and breath sounds checked- equal and bilateral Secured at: 20 cm Tube secured with: Tape Dental Injury: Teeth and Oropharynx as per pre-operative assessment

## 2021-09-24 NOTE — Evaluation (Signed)
Physical Therapy Evaluation Patient Details Name: Taylor [name] MRN: [id number] DOB: 09/10/1986 Today's Date: 09/24/2021  History of Present Illness  Pt admitted for R LE ACL reconstruction along with medial meninsus repair. Sent secure chat to MD with advice to remain in hospital 1 night for improved pain control.  Clinical Impression  Pt is a pleasant 35 year old female who was admitted for R LE ACL reconstruction along with medial meniscus repair. Pt performs bed mobility with min assist, transfers with cga, and ambulation with cga and RW. Pt demonstrates deficits with pain/mobility/sensation. Would benefit from skilled PT to address above deficits and promote optimal return to PLOF. Recommend OP PT for further follow up.     Recommendations for follow up therapy are one component of a multi-disciplinary discharge planning process, led by the attending physician.  Recommendations may be updated based on patient status, additional functional criteria and insurance authorization.  Follow Up Recommendations Outpatient PT    Assistance Recommended at Discharge Intermittent Supervision/Assistance  Functional Status Assessment Patient has had a recent decline in their functional status and demonstrates the ability to make significant improvements in function in a reasonable and predictable amount of time.  Equipment Recommendations  None recommended by PT    Recommendations for Other Services       Precautions / Restrictions Precautions Precautions: Fall Required Braces or Orthoses: Knee Immobilizer - Right Knee Immobilizer - Right: On at all times Restrictions Weight Bearing Restrictions: Yes RLE Weight Bearing:  (FFWB and brace locked in extension)      Mobility  Bed Mobility Overal bed mobility: Needs Assistance Bed Mobility: Supine to Sit     Supine to sit: Min assist     General bed mobility comments: needs assist for surgical leg. Brace locked in extension prior to  mobility    Transfers Overall transfer level: Needs assistance Equipment used: Rolling walker (2 wheels) Transfers: Sit to/from Stand Sit to Stand: Min guard           General transfer comment: cues for sequencing and maintaining correct WBing status    Ambulation/Gait Ambulation/Gait assistance: Min assist Gait Distance (Feet): 3 Feet Assistive device: Rolling walker (2 wheels) Gait Pattern/deviations: Step-to pattern       General Gait Details: able to maintain correct WBing, however feels her knee is hyperextending. MD notified. Further ambulation deferred due to safety.  Stairs            Wheelchair Mobility    Modified Rankin (Stroke Patients Only)       Balance Overall balance assessment: Needs assistance Sitting-balance support: Feet supported Sitting balance-Leahy Scale: Good     Standing balance support: Bilateral upper extremity supported Standing balance-Leahy Scale: Fair                               Pertinent Vitals/Pain Pain Assessment: 0-10 Pain Score: 10-Worst pain ever Facial Expression: Relaxed, neutral Body Movements: Absence of movements Muscle Tension: Relaxed Compliance with ventilator (intubated pts.): N/A Vocalization (extubated pts.): Talking in normal tone or no sound CPOT Total: 0 Pain Location: post knee Pain Descriptors / Indicators: Operative site guarding Pain Intervention(s): Limited activity within patient's tolerance;Monitored during session;Patient requesting pain meds-RN notified;Ice applied    Home Living Family/patient expects to be discharged to:: Private residence Living Arrangements: Spouse/significant other;Children Available Help at Discharge: Family;Available PRN/intermittently Type of Home: House Home Access: Ramped entrance       Home Layout:  One level Home Equipment: Agricultural consultant (2 wheels);BSC/3in1;Shower seat;Wheelchair - manual      Prior Function Prior Level of Function :  Independent/Modified Independent             Mobility Comments: went through OP PT and was able to ambulate without AD prior to surgery. L leg treated with conservative technique ADLs Comments: hasn't been able to work since initial injury in Aug 2022     Hand Dominance        Extremity/Trunk Assessment   Upper Extremity Assessment Upper Extremity Assessment: Overall WFL for tasks assessed    Lower Extremity Assessment Lower Extremity Assessment: Generalized weakness (R LE grossly 2/5 and numbness noted proximal to ankle. L LE grossly 4/5 and sensation WNL)       Communication   Communication: No difficulties  Cognition Arousal/Alertness: Awake/alert Behavior During Therapy: WFL for tasks assessed/performed Overall Cognitive Status: Within Functional Limits for tasks assessed                                 General Comments: initially lethargic, however arousable. with movement, begins crying due to pain.        General Comments      Exercises Other Exercises Other Exercises: Pt able to take steps to [facility] as it was deemed unsafe to ambulate to bathroom. Pt needs cues for sequencing and supervision given for hygiene.   Assessment/Plan    PT Assessment Patient needs continued PT services  PT Problem List Decreased strength;Decreased activity tolerance;Decreased range of motion;Decreased mobility;Obesity;Pain       PT Treatment Interventions DME instruction;Gait training;Therapeutic activities;Therapeutic exercise    PT Goals (Current goals can be found in the Care Plan section)  Acute Rehab PT Goals Patient Stated Goal: to return home PT Goal Formulation: With patient Time For Goal Achievement: 10/08/21 Potential to Achieve Goals: Good    Frequency BID   Barriers to discharge        Co-evaluation               AM-PAC PT "6 Clicks" Mobility  Outcome Measure Help needed turning from your back to your side while in a flat bed without  using bedrails?: A Little Help needed moving from lying on your back to sitting on the side of a flat bed without using bedrails?: A Little Help needed moving to and from a bed to a chair (including a wheelchair)?: A Little Help needed standing up from a chair using your arms (e.g., wheelchair or bedside chair)?: A Little Help needed to walk in hospital room?: A Lot Help needed climbing 3-5 steps with a railing? : Total 6 Click Score: 15    End of Session Equipment Utilized During Treatment: Gait belt Activity Tolerance: Patient limited by pain Patient left: in bed Nurse Communication: Mobility status;Patient requests pain meds PT Visit Diagnosis: Muscle weakness (generalized) (M62.81);Difficulty in walking, not elsewhere classified (R26.2);Pain Pain - Right/Left: Right Pain - part of body: Leg    Time: 1500-1530 PT Time Calculation (min) (ACUTE ONLY): 30 min   Charges:   PT Evaluation $PT Eval Low Complexity: 1 Low PT Treatments $Therapeutic Activity: 8-22 mins        Elizabeth Palau, PT, DPT [phone]   Taylor [name] 09/24/2021, 4:39 PM

## 2021-09-24 NOTE — Progress Notes (Signed)
Postop note:  Patient noted to have significant increased pain postoperatively despite getting a femoral nerve block.  Patient then received an adductor canal nerve block as well.  I given the patient approximately 15-20 minutes after this was performed.  Patient was falling asleep but was easily arousable.  She states that her pain level was improved, but still an 8-9/10 compared to a 10/10 previously.  RLE: 5/5 DF/PF/EHL Numbness over foot consistent with block Positive distal pulses  All compartments of leg and thigh soft and compressible. Polar Care and brace in appropriate position.  35 year old female s/p right ACL reconstruction using quadriceps tendon autograft and medial meniscus repair.  Pain appears to be becoming more well controlled as patient was able to fall asleep. 1.  Recommend PT/OT evaluation prior to discharge 2.  Continue multimodal pain medication regimen. 3.  Patient may be admitted overnight if deemed unsafe to go home/unable to get pain controlled with p.o. medication.

## 2021-09-24 NOTE — Anesthesia Procedure Notes (Signed)
Anesthesia Regional Block: Adductor canal block   Pre-Anesthetic Checklist: , timeout performed,  Correct Patient, Correct Site, Correct Laterality,  Correct Procedure, Correct Position, site marked,  Risks and benefits discussed,  Surgical consent,  Pre-op evaluation,  At surgeon's request and post-op pain management  Laterality: Right  Prep: chloraprep       Needles:  Injection technique: Single-shot  Needle Type: Stimiplex     Needle Length: 9cm  Needle Gauge: 22     Additional Needles:   Procedures:,,,, ultrasound used (permanent image in chart),,    Narrative:  Start time: 09/24/2021 1:30 PM End time: 09/24/2021 1:30 PM Injection made incrementally with aspirations every 20 mL.  Performed by: Personally  Anesthesiologist: Piscitello, Cleda Mccreedy, MD  Additional Notes: Patient consented for risk and benefits of nerve block including but not limited to nerve damage, failed block, bleeding and infection.  Patient voiced understanding.  Pt was able to move ankle and have normal sensation in foot. No decreased sensation to ice in femoral distribution.   Functioning IV was confirmed and monitors were applied.  Timeout done prior to procedure and prior to any sedation being given to the patient.  Patient confirmed procedure site prior to any sedation given to the patient.  A 40mm 22ga Stimuplex needle was used. Sterile prep,hand hygiene and sterile gloves were used.  Minimal sedation used for procedure.  No paresthesia endorsed by patient during the procedure.  Negative aspiration and negative test dose prior to incremental administration of local anesthetic. The patient tolerated the procedure well with no immediate complications.

## 2021-09-24 NOTE — Anesthesia Postprocedure Evaluation (Signed)
Anesthesia Post Note  Patient: Taylor [name]  Procedure(s) Performed: Right arthroscopic ACL reconstruction using quadriceps tendon autograft, chondroplasty (Right: Knee) KENALOG INJECTION (Left: Knee) KNEE ARTHROSCOPY WITH MEDIAL MENISCAL REPAIR (Left: Knee)  Patient location during evaluation: PACU Anesthesia Type: General Level of consciousness: awake and alert Pain management: pain level controlled Vital Signs Assessment: post-procedure vital signs reviewed and stable Respiratory status: spontaneous breathing, nonlabored ventilation and respiratory function stable Cardiovascular status: blood pressure returned to baseline and stable Postop Assessment: no apparent nausea or vomiting Anesthetic complications: no Comments: Pain control issues requiring femoral and subsequent adductor canal nerve block. A sciatic nerve block was deferred due to patient habitus.    No notable events documented.   Last Vitals:  Vitals:   09/24/21 1415 09/24/21 1430  BP: 129/80 (!) 146/81  Pulse: 60 (!) 58  Resp: 13 14  Temp:    SpO2: 92% 94%    Last Pain:  Vitals:   09/24/21 1415  PainSc: 7                  Roxsana Riding Romie Minus

## 2021-09-24 NOTE — Discharge Instructions (Addendum)
Arthroscopic ACL Surgery with Meniscus Repair   Post-Op Instructions   1. Bracing or crutches: Crutches will be provided at the time of discharge from the surgery center.    2. Ice: You may be provided with a device [facility]) that allows you to ice the affected area effectively. Otherwise you can ice manually.   3. Driving:  Driving: Off all narcotic pain meds when operating vehicle   1 week for automatic cars, left leg surgery  4 weeks for standard/manual cars or right leg surgery   4. Activity: Ankle pumps several times an hour while awake to prevent blood clots. Weight bearing: foot-flat weight bearing is permitted with brace locked in extension (weight of leg for balance only -- must use arms for support when operative leg is on the ground). The brace should not be unlocked in order to protect the meniscus repair. Unlock only for hygiene and for exercises as directed by physical therapist. Elevate knee above heart level as much as possible for one week. Avoid standing more than 5 minutes (consecutively) for the first week. No exercise involving the knee until cleared by the surgeon or physical therapist. Ideally, you should avoid long distance travel for 4 weeks.   5. Medications:  - You have been provided a prescription for narcotic pain medicine (oxycodone). After surgery, take 1-2 narcotic tablets every 4 hours if needed for severe pain.  - A prescription for anti-nausea medication (Zofran) will be provided in case the narcotic medicine causes nausea - take 1 tablet every 6 hours only if nauseated.  - Take ibuprofen 800 mg every 8 hours with food to reduce post-operative knee swelling. DO NOT STOP IBUPROFEN POST-OP UNTIL INSTRUCTED TO DO SO at first post-op office visit (10-14 days after surgery).  - Take enteric coated aspirin 325 mg once daily for 4 weeks to prevent blood clots.  -Take tylenol 1000 mg every 8 hours for pain.  May stop tylenol ~1-2 weeks after surgery if you are having  minimal pain. - Take gabapentin 300 mg three times daily for 5 days - Take Valium 5mg  three times daily x 1-2 weeks. May stop if not having any significant muscle spasms.   If you are taking prescription medication for anxiety, depression, insomnia, muscle spasm, chronic pain, or for attention deficit disorder you are advised that you are at a higher risk of adverse effects with use of narcotics post-op, including narcotic addiction/dependence, depressed breathing, death. If you use non-prescribed substances: alcohol, marijuana, cocaine, heroin, methamphetamines, etc., you are at a higher risk of adverse effects with use of narcotics post-op, including narcotic addiction/dependence, depressed breathing, death. You are advised that taking > 50 morphine milligram equivalents (MME) of narcotic pain medication per day results in twice the risk of overdose or death. For your prescription provided: oxycodone 5 mg - taking more than 6 tablets per day. Be advised that we will prescribe narcotics short-term, for acute post-operative pain only - 1 week for minor operations such as knee arthroscopy for meniscus tear resection, and 3 weeks for major operations such as knee repair/reconstruction surgeries.   6. Bandages: The physical therapist should change the bandages at the first post-op appointment. If needed, the dressing supplies have been provided to you.   7. Physical Therapy: 2 times per week for the first 4 weeks, then 1-2 times per week from weeks 4-8 post-op. Therapy typically starts within 1 week. You have been provided an order for physical therapy. The therapist will provide home exercises.  Attending physical therapy and performing the appropriate exercises on your own at home daily will determine how well you do after this surgery. If you do not know when your first physical therapy appointment is, please call the office at [phone]. 8. Work/School: May return when able to tolerate standing for  greater than 2 hours and off of narcotic pain medications. Can return to school usually in ~1-2 weeks.    9. Post-Op Appointments: Your first post-op appointment will be with Dr. Allena Katz in approximately 2 weeks time.    If you find that they have not been scheduled please call the Orthopaedic Appointment front desk at [phone].   AMBULATORY SURGERY  DISCHARGE INSTRUCTIONS   The drugs that you were given will stay in your system until tomorrow so for the next 24 hours you should not:  Drive an automobile Make any legal decisions Drink any alcoholic beverage   You may resume regular meals tomorrow.  Today it is better to start with liquids and gradually work up to solid foods.  You may eat anything you prefer, but it is better to start with liquids, then soup and crackers, and gradually work up to solid foods.   Please notify your doctor immediately if you have any unusual bleeding, trouble breathing, redness and pain at the surgery site, drainage, fever, or pain not relieved by medication.    Additional Instructions:        Please contact your physician with any problems or Same Day Surgery at [phone], Monday through Friday 6 am to 4 pm, or [place] [facility] number at [phone].     Peripheral Nerve Block (Lower Extremity) Discharge Instructions    For your surgery you have received a femoral Nerve Block.  Your Nerve Block is expected to last for about 4 to 12 hours.  This is an estimated time frame; the results of your nerve block may wear off sooner or may last longer.  If needed, your surgeon will give you a prescription for pain medication.  It will take about 60 minutes for the oral pain medication to become fully effective.  So, it is recommended that you start taking this medication before the nerve block first begins to wear off, or when you first begin to feel discomfort.  Keep in mind that nerve blocks often wear off in the middle of the  night.   If you are going to bed and the block has not started to wear off or you have not started to have any discomfort, consider setting an alarm for 2 to 3 hours, so you can assess your block.  If you notice the block is wearing off or you are starting to have discomfort, you can take your pain medication.  Take your pain medication only as prescribed.  Pain medication can cause sedation and decrease your breathing if you take more than you need for the level of pain that you have.  Nausea is a common side effect of many pain medications.  You may want to eat something before taking your pain medicine to prevent nausea.  After a Peripheral Nerve block, you cannot feel pain, pressure or extremes in temperature in the effected leg.  Because your leg is numb it is at an increased risk for injury.  To decrease the possibility of injury, please practice the following:  While you are awake change the position of your leg frequently to prevent too much pressure on any one area for prolonged periods of  time.   If you have a cast or tight dressing, check the color or your toes every couple of hours.  Call your surgeon with the appearance of any discoloration (white or blue).  You may have difficulty bearing weight on the effected leg.  Have someone assist you with walking until the nerve block has completely worn off.   If you surgeon prescribed a brace to be worn after surgery, DO NOT GET UP AT NIGHT WITHOUT YOUR BRACE.  If your surgeon has restricted the amount of weight you should bear on the effected leg, i.e. No Weight, Partial Weight, or Touch Down Only, DO NOT BEAR MORE WEIGHT THAN INSTRUCTED.   If you experience any problems or concerns, please contact your surgeon.

## 2021-09-24 NOTE — Op Note (Addendum)
Operative Note    SURGERY DATE: 09/24/2021   PRE-OP DIAGNOSIS:  1. Right knee anterior cruciate ligament tear 2. Right medial meniscus tear 3. Left knee anterior cruciate ligament tear   POST-OP DIAGNOSIS:  1. Right knee anterior cruciate ligament tear 2. Right medial meniscus tear 3. Right knee early degenerative changes to medial compartment 4. Left knee anterior cruciate ligament tear   PROCEDURES:  1. Right knee anterior cruciate ligament reconstruction with quadriceps tendon autograft 2. Right medial meniscus repair 3. Right medial femoral condyle chondroplasty 4. Left knee corticosteroid injection   SURGEON: Rosealee Albee, MD  ASSISTANT: Dedra Skeens, PA   ANESTHESIA: Gen + regional nerve block   ESTIMATED BLOOD LOSS: 25cc   TOTAL IV FLUIDS: per anesthesia  INDICATION(S):  Taylor [name] is a 35 y.o. female who initially had a torn ACL to the left knee and is coming to the emergency department for evaluation of this.  She then injured her right knee as she was leaving the hospital and was subsequently found to have an ACL tear on the right side as well.  She underwent an extensive course of nonoperative management with physical therapy and activity modifications and had improvement in the symptoms in her left knee, but had persistent Instability episodes to the right knee.  MRI showed an ACL tear and medial meniscus tear, which was consistent with the clinical exam.  We discussed risks of surgery including but not limited to possible ACL and/or meniscus re-tear, infection, bleeding, muscle/nerve damage, DVT, complications of anesthesia, and postoperative knee pain and arthrofibrosis. After discussion of risks, benefits, and alternatives to surgery, the patient and/or family elected to proceed.  After discussion of risks, benefits, and alternatives to surgery, the patient elected to proceed.     OPERATIVE FINDINGS:    Examination under anesthesia: A careful examination  under anesthesia was performed.  Passive range of motion was: Hyperextension: 2.  Extension: 0.  Flexion: 125.  Lachman: 2B. Pivot Shift: grade 2.  Posterior drawer: normal.  Varus stability in full extension: normal.  Varus stability in 30 degrees of flexion: normal.  Valgus stability in full extension: normal.  Valgus stability in 30 degrees of flexion: normal.   Intra-operative findings: A thorough arthroscopic examination of the knee was performed.  The findings are: 1. Suprapatellar pouch: Normal 2. Undersurface of median ridge: Grade 1 degenerative changes 3. Medial patellar facet: Grade 1 degenerative changes 4. Lateral patellar facet: Grade 1 degenerative changes 5. Trochlea: Grade 1 degenerative changes 6. Lateral gutter/popliteus tendon: Normal 7. Hoffa's fat pad: Normal 8. Medial gutter/plica: Normal 9. ACL: Abnormal: complete femoral avulsion 10. PCL: Normal 11. Medial meniscus: vertical tear of the posterior horn at the red-white junction near the root and the white white junction at the posterior horn body 12. Medial compartment cartilage: Focal area of grade 2-3 degenerative change to the femoral condyle, grade 1 degenerative changes to the tibial plateau 13. Lateral meniscus: Normal 14. Lateral compartment cartilage: Normal   OPERATIVE REPORT:   I identified Taylor [name] in the pre-operative holding area. I marked the operative knee with my initials. I reviewed the risks and benefits of the proposed surgical intervention and the patient (and/or patient's guardian) wished to proceed.  The patient was transferred to the operative suite and placed in the supine position with all bony prominences padded.  Care was taken to ensure that the contralateral leg was placed in neutral position and that the operative leg was well-padded in the leg  holder.     Appropriate IV antibiotics were administered within 30 minutes of incision. The extremity was then prepped and draped in standard  fashion. A time out was performed confirming the correct extremity, correct patient and correct procedure.   Given the clear presence of a pivot shift on examination under anesthesia, I first directed my attention to the harvest of a quadriceps autograft.  The right lower extremity was exsanguinated with an Esmarch, and a thigh tourniquet was elevated to 300 mmHg.  The total tourniquet time for this case was 130 minutes.    A 6cm incision was planned just proximal to the proximal pole of the patella.  The incision was made with a 15 blade, and subcutaneous fat was sharply excised to expose the quadriceps tendon.  The paratenon was sharply incised, and the space in between the paratenon and the quadriceps tendon was bluntly developed with a sponge and a key elevator.  A speculum retractor was placed anteriorly, and the quadriceps was easily visualized with the arthroscope.  The vastus lateralis and VMO were clearly identified, as was the junction of the rectus femoris muscle with the proximal aspect of the quadriceps tendon.  Under direct visualization with the arthroscope, an Arthrex 10 mm parallel blade was used to incise the quadriceps tendon from its most proximal extent, to the junction with the patella.  Care was taken not to violate the rectus femoris muscle.  Then, using a 15 blade, the graft was transected and elevated distally off the patella, creating a 7 mm thick partial thickness graft.  Dissection was carried proximally to create a uniformly thick graft, 64mm in length.  The distal end of the graft was controlled with a #2 Fiberwire stitch, and the Arthrex quadriceps harvester/cutter was loaded over the graft.  At a length of 49mm, the harvest/cutter was used to transect the graft proximally, and the graft was removed from the wound.  The arthroscope was used to confirm a partial thickness harvest with no violation of the anterior knee capsule.     On the back table, the graft was prepared in  standard fashion.  The length of the graft was 58mm.  Each end was prepared using an Warden/ranger.  The femoral end was secured around a TightRope RT, and the tibial end was secured around an ABS loop.  The femoral end of the graft was 10 mm in diameter, the tibial end was 85mm in diameter.  The graft was tensioned to 20 lbs and reserved for later use.  The graft was placed in a graft tube and tensioned to 20 lbs and reserved for later use.  Of note, it was soaked in 5 mg/mL vancomycin solution to reduce risk of infection for at least 20 minutes prior to implantation.   Standard anterolateral portals was created with an 11-blade.  The arthroscope was introduced through the anterolateral portal, and a full diagnostic arthroscopy was performed as described above.   An anteromedial portal was made under needle localization. A shaver was introduced through the anteromedial portal and used to gently debride the fat pad to improve visualization. Then the ACL remnant was debrided using the shaver, leaving 1-2 mm stumps on the tibia for anatomic referencing.  The medial meniscus was addressed next. A rasp was used to roughen the capsular tissue about the meniscus. A Ceterix Novostitch was placed across the vertical tear adjacent to the meniscus root in the red-white junction on either side of the tear and knot was  tied arthroscopically. Two Stryker IVY Air all-inside suture devices were used to repair the posterior horn tear in the white-white region. One was placed on the femoral side, the other placed on the tibial side. The tear was probed afterwards and found to be stable and anatomically reduced.    Next, a gentle notchplasty was created with a burr. I then created the femoral socket. This was performed with an outside-in technique using an Teacher, English as a foreign language. The retrograde femoral guide was utilized. We made the lateral stab incision with a 15 blade and followed the angle of the drill sleeve to  make the incision through the IT band down to bone. The drill sleeve was pushed down to bone on the lateral femoral condyle and the guide was placed on the anatomic footprint of the ACL. A 10mm tunnel 23mm in length was drilled. We then used a FiberStick to pass a suture through the femoral tunnel and out of the anteromedial portal.    I then directed my attention to preparation of the tibial tunnel. A tibial guide set at 60 degrees was inserted through the anteromedial portal and centered over the tibial footprint.  The drill sleeve was then advanced to the proximal medial tibia just at the junction of the tibial tubercle and the pes tendon, through a ~6cm incision.  The anticipated tunnel length was 40mm.  A guide pin was then drilled through the proximal tibia under direct arthroscopic visualization into the center of the ACL footprint.  This was then over-reamed with an Arthrex 10mm reamer. Bony debris and soft tissue was cleared from the metaphyseal opening with electrocautery and from the intra-articular aperture with a shaver.   The passing suture was then brought out of the tibial tunnel.  The graft was then advanced into place in standard fashion.  The femoral Tight Rope was deployed on the lateral cortex under direct arthroscopic visualization from the anteromedial portal.  Correct position on the lateral cortex was confirmed fluoroscopically.  The TightRope was then shortened until at least 20 mm of graft was in the femoral tunnel.     I then directed my attention to tibial fixation.  This was performed with the knee in full extension with an axial and posterior drawer load applied to the tibia.  It appeared that the ABS button would not have enough room for seating due graft being too close to the tibial aperture so decision was made to utilize an interference screw. 10x16mm BioComposite interference screw was placed over a nitinol guidewire that was anterior to the graft. This achieved excellent  interference fit.  A repeat examination under anesthesia was performed.  The patient retained full hyperextension and flexion.  The Lachman's was normalized.  The arthroscope was re-introduced into the knee joint, confirming excellent position and tension of the quadriceps autograft.  There was no lateral wall or roof impingement.  Tibial and femoral sutures were cut.   The wounds were irrigated. 2-0 Vicryl was used to close the quadriceps paratenon.  0 Vicryl was used to close the sartorius fascia.  2-0 Vicryl was used to close the subdermal layers of both the quadriceps tendon harvest and the tibial tunnel incisions.  The harvest incision and the proximal medial tibial incisions were then closed with 4-0 Monocryl and Dermabond.  The arthroscopy portals and lateral femoral incision were closed with 3-0 Nylon.  A sterile dressing was applied, followed by a Polar Care device and a hinged knee brace locked in full extension.   The  patient was awakened from anesthesia without difficulty and was transferred to the PACU in stable condition.   Of note, assistance from a PA was essential to performing the surgery.  PA was present for the entire surgery.  PA assisted with patient positioning, retraction, instrumentation, and wound closure. The surgery would have been more difficult and had longer operative time without PA assistance.    POSTOPERATIVE PLAN: The patient will be discharged home today once they meet PACU criteria. FFWB x 4 weeks. WBAT with crutches from weeks 4-6. Aspirin 325 mg daily for 4 weeks for DVT prophylaxis. Start physical therapy on POD#3-4. Follow up in 2 weeks per protocol.

## 2021-09-25 ENCOUNTER — Emergency Department
Admission: EM | Admit: 2021-09-25 | Discharge: 2021-09-25 | Disposition: A | Discharge status: Home / Self Care | Payer: 59 | Source: Home / Self Care | Attending: Emergency Medicine | Admitting: Emergency Medicine

## 2021-09-25 ENCOUNTER — Emergency Department: Payer: 59

## 2021-09-25 ENCOUNTER — Encounter: Payer: Self-pay | Admitting: Orthopedic Surgery

## 2021-09-25 ENCOUNTER — Other Ambulatory Visit: Payer: Self-pay

## 2021-09-25 DIAGNOSIS — S8991XA Unspecified injury of right lower leg, initial encounter: Secondary | ICD-10-CM | POA: Insufficient documentation

## 2021-09-25 DIAGNOSIS — Z9889 Other specified postprocedural states: Secondary | ICD-10-CM

## 2021-09-25 DIAGNOSIS — Z7982 Long term (current) use of aspirin: Secondary | ICD-10-CM | POA: Insufficient documentation

## 2021-09-25 DIAGNOSIS — M1711 Unilateral primary osteoarthritis, right knee: Secondary | ICD-10-CM | POA: Diagnosis not present

## 2021-09-25 DIAGNOSIS — S83511A Sprain of anterior cruciate ligament of right knee, initial encounter: Secondary | ICD-10-CM | POA: Diagnosis not present

## 2021-09-25 DIAGNOSIS — I10 Essential (primary) hypertension: Secondary | ICD-10-CM | POA: Insufficient documentation

## 2021-09-25 DIAGNOSIS — Z87891 Personal history of nicotine dependence: Secondary | ICD-10-CM | POA: Insufficient documentation

## 2021-09-25 DIAGNOSIS — R42 Dizziness and giddiness: Secondary | ICD-10-CM | POA: Diagnosis not present

## 2021-09-25 DIAGNOSIS — M7989 Other specified soft tissue disorders: Secondary | ICD-10-CM | POA: Diagnosis not present

## 2021-09-25 DIAGNOSIS — S83241A Other tear of medial meniscus, current injury, right knee, initial encounter: Secondary | ICD-10-CM | POA: Diagnosis not present

## 2021-09-25 DIAGNOSIS — Z79899 Other long term (current) drug therapy: Secondary | ICD-10-CM | POA: Insufficient documentation

## 2021-09-25 DIAGNOSIS — E039 Hypothyroidism, unspecified: Secondary | ICD-10-CM | POA: Insufficient documentation

## 2021-09-25 DIAGNOSIS — S83512A Sprain of anterior cruciate ligament of left knee, initial encounter: Secondary | ICD-10-CM | POA: Diagnosis not present

## 2021-09-25 DIAGNOSIS — M25561 Pain in right knee: Secondary | ICD-10-CM | POA: Diagnosis not present

## 2021-09-25 DIAGNOSIS — W19XXXA Unspecified fall, initial encounter: Secondary | ICD-10-CM | POA: Insufficient documentation

## 2021-09-25 IMAGING — DX DG KNEE COMPLETE 4+V*R*
3 series · 4 of 4 positions shown · non-contrast
Comparison: [DATE] plain film.

CLINICAL DATA: ACL repair yesterday.  Fall.

EXAM:
RIGHT KNEE - COMPLETE 4+ VIEW

[knee ap]
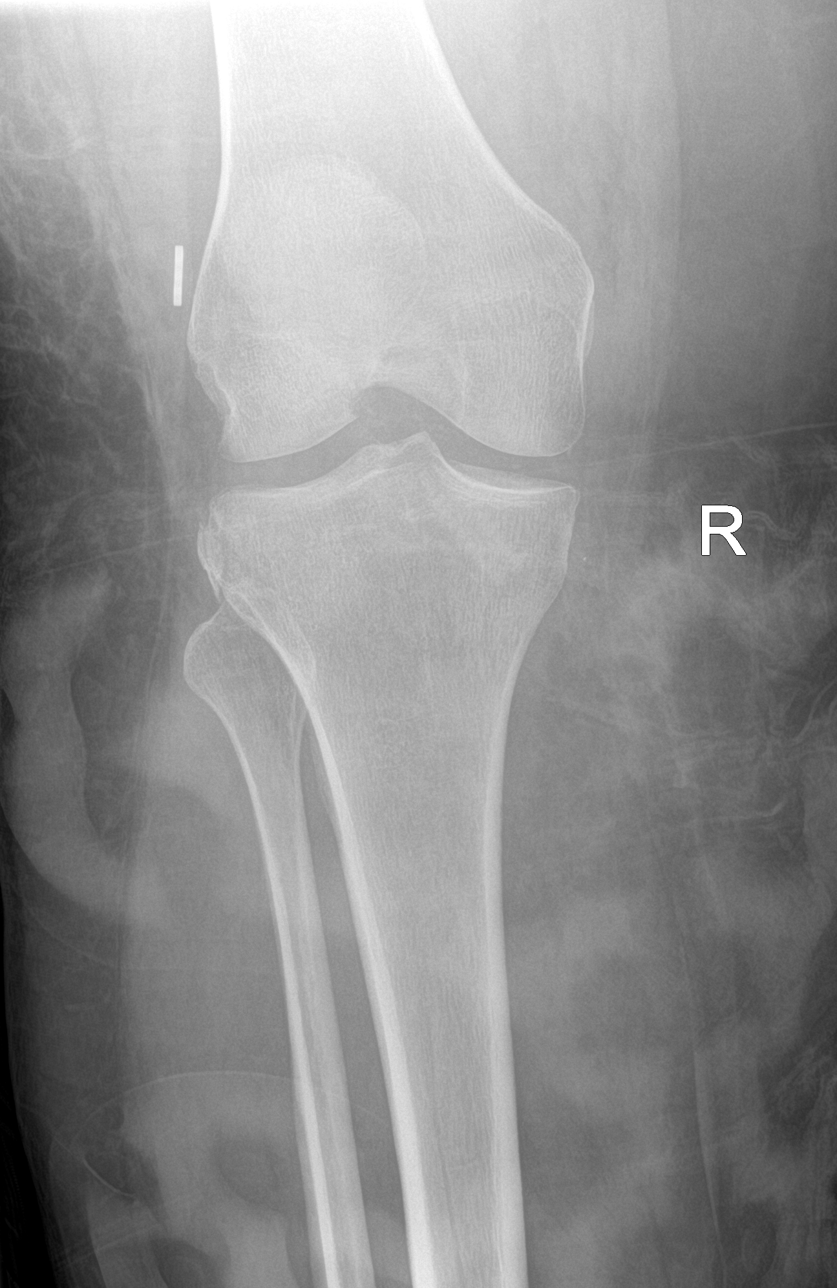

[knee lat]
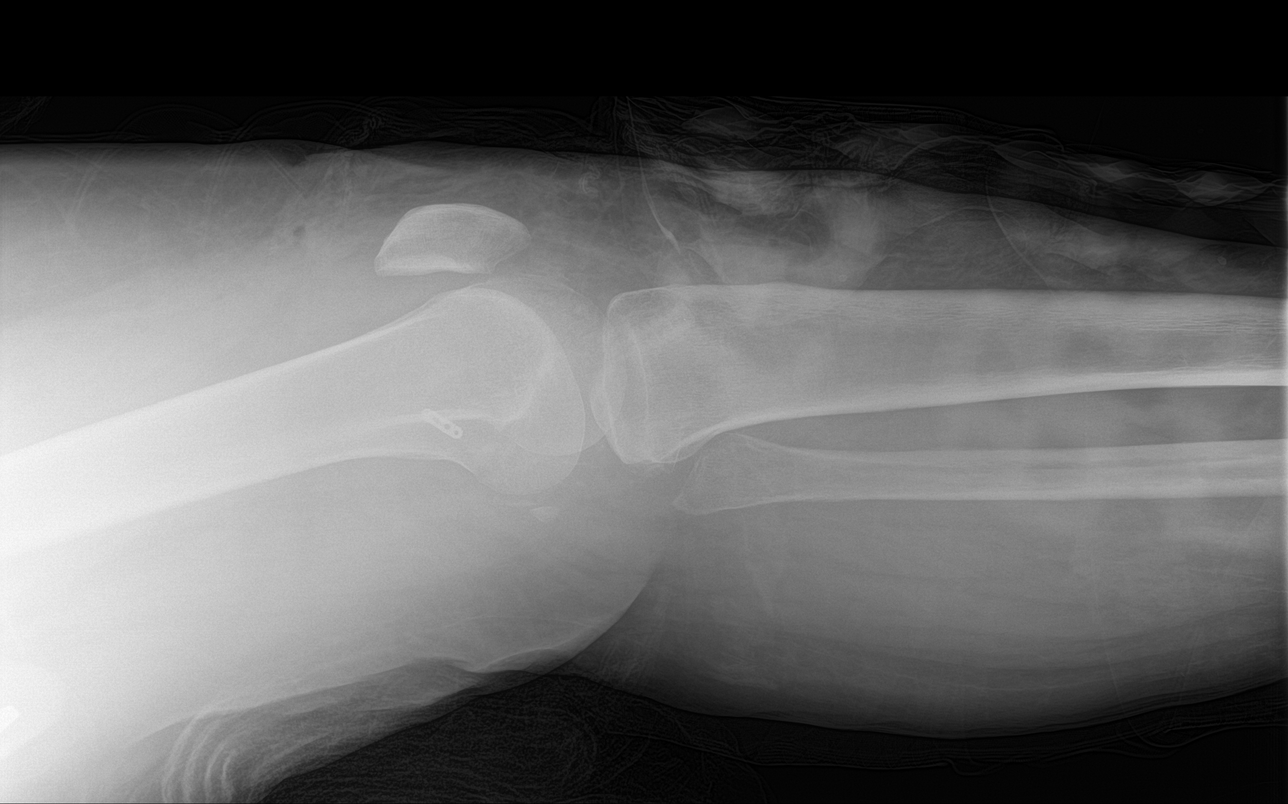

[Series 5: knee obl · 0.14mm/px · 2 of 2 slices shown]
[im 1/2]
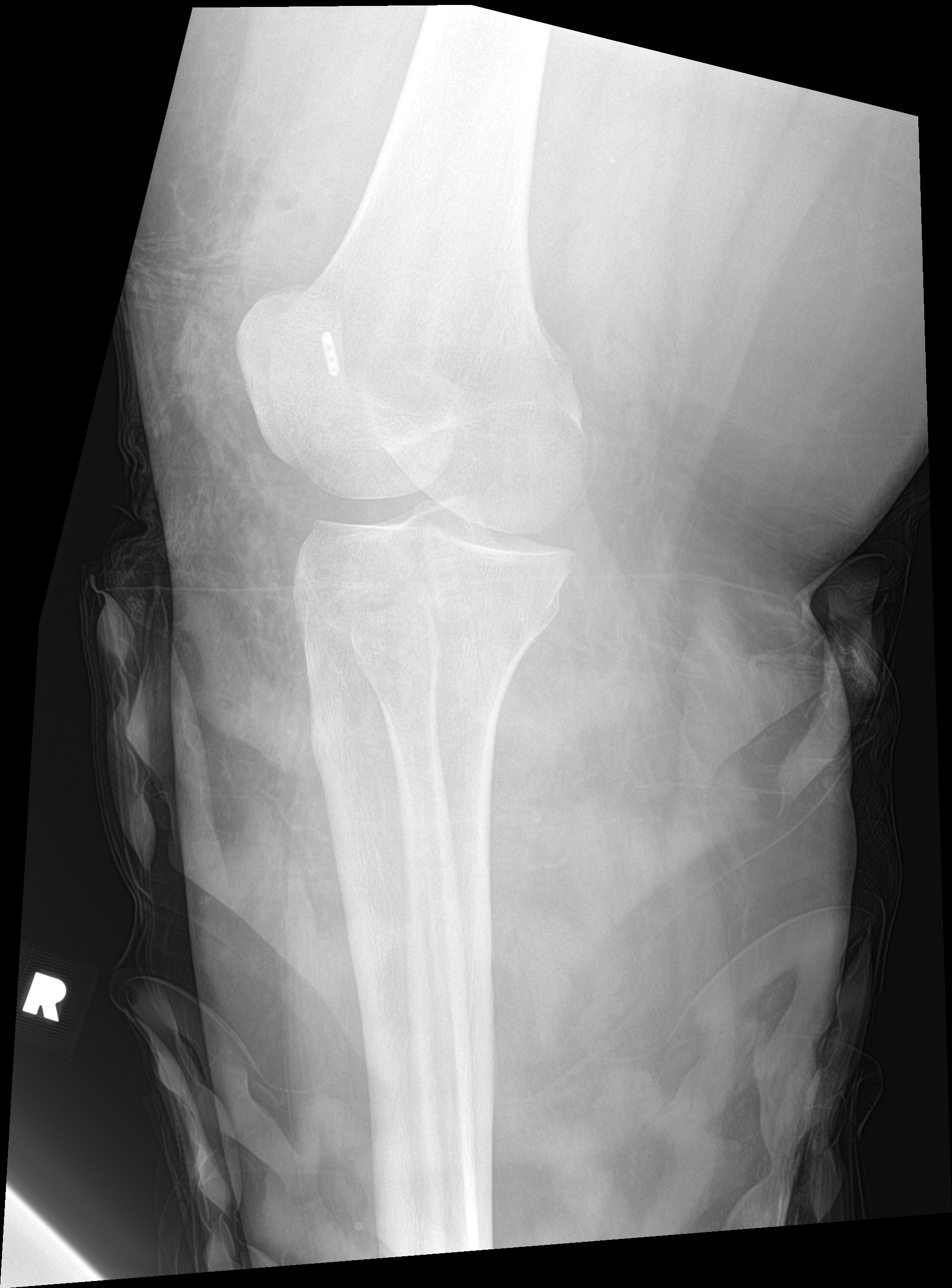
[im 2/2]
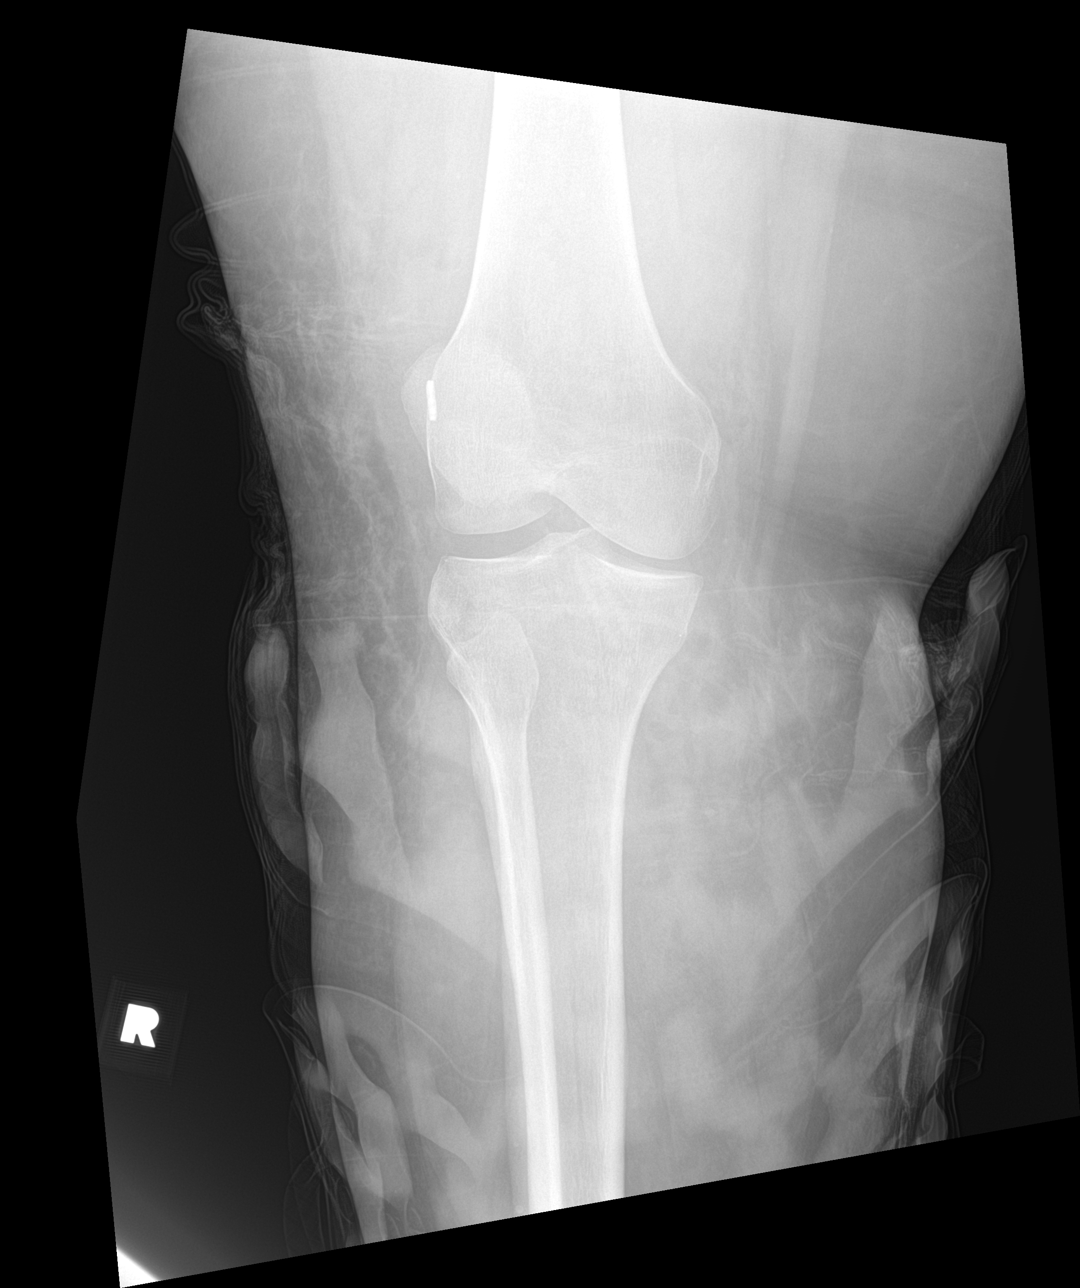

[4 of 4 positions shown; findings below may reference images not displayed]

FINDINGS: The attempted lateral view is oblique. Osseous irregularity about
the lateral tibia on the AP view is similar to on the prior exam and
corresponded to a lateral tibial plateau fracture on subsequent CT.

Prepatellar soft tissue swelling. Limited evaluation for joint
effusion secondary to obliquity and patient body habitus.
IMPRESSION: Limited radiographs, secondary to patient positioning and body
habitus. No gross acute injury identified, with similar osseous
irregularity involving the lateral tibial plateau. If patient's
symptoms warrant, consider further evaluation with CT.

## 2021-09-25 MED ORDER — MORPHINE SULFATE (PF) 4 MG/ML IV SOLN
4.0000 mg | Freq: Once | INTRAVENOUS | Status: AC
  Administered 2021-09-25: 4 mg via INTRAMUSCULAR
  Filled 2021-09-25: qty 1

## 2021-09-25 MED ORDER — ONDANSETRON 8 MG PO TBDP
8.0000 mg | ORAL_TABLET | Freq: Once | ORAL | Status: AC
  Administered 2021-09-25: 8 mg via ORAL
  Filled 2021-09-25: qty 1

## 2021-09-25 NOTE — Progress Notes (Signed)
DISCHARGE NOTE:  Pt given discharge instructions and scripts. Pt verbalized understanding. Belongings sent with pt. Husband at bedside and providing transportation. Pt wheeled to car by staff.

## 2021-09-25 NOTE — ED Triage Notes (Addendum)
Pt discharged this am from ACL surgery, she went home and tried to get out of car and fell. She was not suppose to bend right knee but when she fell her knee did bend. Called her PMD and the PA said that they were here and to come so he can see her.  Vs 158/92, HR 78, 98%on RA

## 2021-09-25 NOTE — ED Triage Notes (Signed)
See first nurse note, pt [facility] from home. Was d/c today from having ACL surgery to right knee yesterday. Reports she almost fell today and bent her right knee (which is not supposed to do) and is having increased pain.  Brace to right knee.

## 2021-09-25 NOTE — Discharge Summary (Signed)
Physician Discharge Summary  Subjective: 1 Day Post-Op Procedure(s) (LRB): Right arthroscopic ACL reconstruction using quadriceps tendon autograft, chondroplasty (Right) KENALOG INJECTION (Left) KNEE ARTHROSCOPY WITH MEDIAL MENISCAL REPAIR (Left) Patient reports pain as moderate.   Patient seen in rounds with Dr. Allena Katz. Patient is well, and has had no acute complaints or problems Patient is ready to go home after physical therapy  Physician Discharge Summary  Patient ID: Taylor [name] MRN: [id number] DOB/AGE: [birth year] 35 y.o.  Admit date: 09/24/2021 Discharge date: 09/25/2021  Admission Diagnoses:  Discharge Diagnoses:  Principal Problem:   S/P ACL reconstruction   Discharged Condition: fair  Hospital Course: The patient is postop day 1 from a right knee medial meniscus repair with ACL reconstruction.  She was kept overnight because of pain management and physical therapy.  She is slowly improving with her pain management and has done physical therapy once.  She will do physical therapy this morning before discharge home.  Her vitals have remained stable.  Treatments: surgery:  1. Right knee anterior cruciate ligament reconstruction with quadriceps tendon autograft 2. Right medial meniscus repair 3. Right medial femoral condyle chondroplasty   SURGEON: Rosealee Albee, MD   ASSISTANT: Dedra Skeens, PA   ANESTHESIA: Gen + regional nerve block   ESTIMATED BLOOD LOSS: 25cc   TOTAL IV FLUIDS: per anesthesia  Discharge Exam: Blood pressure 131/84, pulse 72, temperature 97.6 F (36.4 C), resp. rate 16, height 5\' 6"  (1.676 m), weight 122.5 kg, SpO2 100 %, currently breastfeeding.   Disposition: Discharge disposition: 01-Home or Self Care       Discharge Instructions     Call MD / Call 911   Complete by: As directed    If you experience chest pain or shortness of breath, CALL 911 and be transported to the hospital emergency room.  If you develope a fever above  101 F, pus (white drainage) or increased drainage or redness at the wound, or calf pain, call your surgeon's office.   Constipation Prevention   Complete by: As directed    Drink plenty of fluids.  Prune juice may be helpful.  You may use a stool softener, such as Colace (over the counter) 100 mg twice a day.  Use MiraLax (over the counter) for constipation as needed.   Diet - low sodium heart healthy   Complete by: As directed    Increase activity slowly as tolerated   Complete by: As directed    Post-operative opioid taper instructions:   Complete by: As directed    POST-OPERATIVE OPIOID TAPER INSTRUCTIONS: It is important to wean off of your opioid medication as soon as possible. If you do not need pain medication after your surgery it is ok to stop day one. Opioids include: Codeine, Hydrocodone(Norco, Vicodin), Oxycodone(Percocet, oxycontin) and hydromorphone amongst others.  Long term and even short term use of opiods can cause: Increased pain response Dependence Constipation Depression Respiratory depression And more.  Withdrawal symptoms can include Flu like symptoms Nausea, vomiting And more Techniques to manage these symptoms Hydrate well Eat regular healthy meals Stay active Use relaxation techniques(deep breathing, meditating, yoga) Do Not substitute Alcohol to help with tapering If you have been on opioids for less than two weeks and do not have pain than it is ok to stop all together.  Plan to wean off of opioids This plan should start within one week post op of your joint replacement. Maintain the same interval or time between taking each dose and first decrease the  dose.  Cut the total daily intake of opioids by one tablet each day Next start to increase the time between doses. The last dose that should be eliminated is the evening dose.         Allergies as of 09/25/2021       Reactions   Keflex [cephalexin] Anaphylaxis        Medication List      STOP taking these medications    HYDROcodone-acetaminophen 5-325 MG tablet Commonly known as: NORCO/VICODIN       TAKE these medications    acetaminophen 500 MG tablet Commonly known as: TYLENOL Take 2 tablets (1,000 mg total) by mouth every 8 (eight) hours.   ALPRAZolam 0.25 MG tablet Commonly known as: XANAX Take 0.25 mg by mouth 2 (two) times daily as needed for anxiety.   aspirin EC 325 MG tablet Take 1 tablet (325 mg total) by mouth daily for 28 days.   cyanocobalamin 1000 MCG/ML injection Commonly known as: (VITAMIN B-12) Inject 1,000 mcg into the muscle every 30 (thirty) days.   diazepam 5 MG tablet Commonly known as: Valium Take 1 tablet (5 mg total) by mouth every 8 (eight) hours as needed for muscle spasms.   ergocalciferol 1.25 MG (50000 UT) capsule Commonly known as: VITAMIN D2 take 1 capsule by mouth Once a week What changed:  how much to take how to take this when to take this   famotidine 20 MG tablet Commonly known as: PEPCID Take 1 tablet by mouth two times daily for breakthrough heartburn as needed (1 tablet for breakthrough heartburn Orally twice a day (bid) as needed (prn) 90 days) What changed:  how much to take how to take this when to take this   FLUoxetine 40 MG capsule Commonly known as: PROZAC Take 1 capsule by mouth once daily in the morning (1 capsule in the morning Orally Once a day 90 days)   gabapentin 300 MG capsule Commonly known as: Neurontin Take 1 capsule (300 mg total) by mouth 3 (three) times daily for 5 days.   hydrochlorothiazide 12.5 MG capsule Commonly known as: MICROZIDE Take 12.5 mg by mouth daily.   ibuprofen 800 MG tablet Commonly known as: ADVIL Take 1 tablet (800 mg total) by mouth 3 (three) times daily for 14 days.   levonorgestrel 20 MCG/24HR IUD Commonly known as: MIRENA 1 each by Intrauterine route once.   levothyroxine 112 MCG tablet Commonly known as: Synthroid Take 1 tablet by mouth once  daily (1 tablet Orally Once a day 90 days)   methocarbamol 750 MG tablet Commonly known as: ROBAXIN Take 750 mg by mouth every 8 (eight) hours as needed for muscle spasms.   multivitamin with minerals tablet Take 1 tablet by mouth daily.   ondansetron 4 MG disintegrating tablet Commonly known as: ZOFRAN-ODT Take 1 tablet (4 mg total) by mouth every 8 (eight) hours as needed for nausea or vomiting.   oxyCODONE 5 MG immediate release tablet Commonly known as: Roxicodone Take 1-2 tablets (5-10 mg total) by mouth every 4 (four) hours as needed (pain).   pantoprazole 40 MG tablet Commonly known as: PROTONIX Take 1 tablet by mouth once daily (1 tablet Orally Once a day 90 days)   phentermine 37.5 MG tablet Commonly known as: ADIPEX-P Take 37.5 mg by mouth daily before breakfast.   Saxenda 18 MG/3ML Sopn Generic drug: Liraglutide -Weight Management INJECT 3 MG INTO THE SKIN ONCE A DAY   Saxenda 18 MG/3ML Sopn Generic drug: Liraglutide -  Weight Management Inject 3 mg into the skin once daily (3 mg Subcutaneous once a day)        Follow-up Information     Signa Kell, MD. Go on 10/10/2021.   Specialty: Orthopedic Surgery Why: at 3:15pm Contact information: [address] [place] [address] [phone]                 Signed: Lenard Forth, Carletha Dawn 09/25/2021, 7:06 AM   Objective: Vital signs in last 24 hours: Temp:  [97.6 F (36.4 C)-98.4 F (36.9 C)] 97.6 F (36.4 C) (12/13 0404) Pulse Rate:  [58-102] 72 (12/13 0404) Resp:  [8-22] 16 (12/13 0404) BP: (122-164)/(68-135) 131/84 (12/13 0404) SpO2:  [85 %-100 %] 100 % (12/13 0404) Weight:  [122.5 kg] 122.5 kg (12/12 1708)  Intake/Output from previous day:  Intake/Output Summary (Last 24 hours) at 09/25/2021 0706 Last data filed at 09/25/2021 0255 Gross per 24 hour  Intake 1557.18 ml  Output 5 ml  Net 1552.18 ml    Intake/Output this shift: No intake/output data recorded.  Labs: No results  for input(s): HGB in the last 72 hours. No results for input(s): WBC, RBC, HCT, PLT in the last 72 hours. No results for input(s): NA, K, CL, CO2, BUN, CREATININE, GLUCOSE, CALCIUM in the last 72 hours. No results for input(s): LABPT, INR in the last 72 hours.  EXAM: General - Patient is Alert and Oriented Extremity - Neurovascular intact Sensation intact distally Dorsiflexion/Plantar flexion intact Incision - clean, dry, no drainage Motor Function -plantarflexion and dorsiflexion intact.  Assessment/Plan: 1 Day Post-Op Procedure(s) (LRB): Right arthroscopic ACL reconstruction using quadriceps tendon autograft, chondroplasty (Right) KENALOG INJECTION (Left) KNEE ARTHROSCOPY WITH MEDIAL MENISCAL REPAIR (Left) Procedure(s) (LRB): Right arthroscopic ACL reconstruction using quadriceps tendon autograft, chondroplasty (Right) KENALOG INJECTION (Left) KNEE ARTHROSCOPY WITH MEDIAL MENISCAL REPAIR (Left) Past Medical History:  Diagnosis Date   Anxiety    Complication of anesthesia    Hard to put under anesthesia, combative when waking up, heart palpitations under anesthesia   Depression    Essential hypertension 08/08/2016   GERD (gastroesophageal reflux disease)    H/O back injury    broken back age 89   Hypertension    with pregnancy   Hypothyroidism    Principal Problem:   S/P ACL reconstruction  Estimated body mass index is 43.59 kg/m as calculated from the following:   Height as of this encounter: 5\' 6"  (1.676 m).   Weight as of this encounter: 122.5 kg. Advance diet Up with therapy Diet - Regular diet Follow up - in 2 weeks Activity -touchdown weightbearing on the right knee Disposition - Home Condition Upon Discharge - Stable DVT Prophylaxis - Aspirin  , PA-C Orthopaedic Surgery 09/25/2021, 7:06 AM

## 2021-09-25 NOTE — TOC Initial Note (Signed)
Transition of Care [facility]) - Initial/Assessment Note    Patient Details  Name: Taylor [name] MRN: [id number] Date of Birth: [birth year]  Transition of [facility]) CM/SW Contact:    Caryn Section, RN Phone Number: 09/25/2021, 8:45 AM  Clinical Narrative:    patient was here for a knee procedure, discharging home today.  She has no concerns about transportation to appointments or to the pharmacy.  Patient reports that she takes her medications as prescribed without concerns.    Patient reports that she does not need any DME, she has all necessary equipment at home, as her condition has been going on since August.  Patient attends Physical Therapy at [facility] currently and plans to return to this facility for therapy.Her physicians here are aware of the plan.  Patient states she has no further discharge needs at this time.  RNCM contact information provided if needed.               Expected Discharge Plan: Home/Self Care (Outpatient PT) Barriers to Discharge: Barriers Resolved   Patient Goals and CMS Choice     Choice offered to / list presented to : NA  Expected Discharge Plan and Services Expected Discharge Plan: Home/Self Care (Outpatient PT)   Discharge Planning Services: CM Consult Post Acute Care Choice: NA Living arrangements for the past 2 months: Single Family Home Expected Discharge Date: 09/25/21                                    Prior Living Arrangements/Services Living arrangements for the past 2 months: Single Family Home Lives with:: Self, Other (Comment) Patient language and need for interpreter reviewed:: Yes (No interpreter required) Do you feel safe going back to the place where you live?: Yes      Need for Family Participation in Patient Care: Yes (Comment) Care giver support system in place?: Yes (comment) Current home services: DME (outpatient PT) Criminal Activity/Legal Involvement Pertinent to Current Situation/Hospitalization: No -  Comment as needed  Activities of Daily Living Home Assistive Devices/Equipment: Eyeglasses, Contact lenses, Walker (specify type) ADL Screening (condition at time of admission) Patient's cognitive ability adequate to safely complete daily activities?: Yes Is the patient deaf or have difficulty hearing?: No Does the patient have difficulty seeing, even when wearing glasses/contacts?: No Does the patient have difficulty concentrating, remembering, or making decisions?: No Patient able to express need for assistance with ADLs?: Yes Does the patient have difficulty dressing or bathing?: No Independently performs ADLs?: Yes (appropriate for developmental age) Does the patient have difficulty walking or climbing stairs?: Yes Weakness of Legs: Right Weakness of Arms/Hands: None  Permission Sought/Granted Permission sought to share information with : Case Manager Permission granted to share information with : Yes, Verbal Permission Granted     Permission granted to share info w AGENCY: Outpatient PT in [facility]        Emotional Assessment Appearance:: Appears stated age Attitude/Demeanor/Rapport: Gracious, Engaged Affect (typically observed): Pleasant, Appropriate Orientation: : Oriented to Self, Oriented to Place, Oriented to  Time, Oriented to Situation Alcohol / Substance Use: Not Applicable Psych Involvement: No (comment)  Admission diagnosis:  S/P ACL reconstruction [B15.176] Patient Active Problem List   Diagnosis Date Noted   S/P ACL reconstruction 09/24/2021   Right medial tibial plateau fracture 05/28/2021   Patellar fracture 05/28/2021   S/P laparoscopic sleeve gastrectomy Dec 2018 09/16/2017   Morbid obesity (HCC)  09/16/2017   Essential hypertension 08/08/2016   PVCs (premature ventricular contractions) 08/06/2016   Cholecystitis 06/03/2016   Abdominal pain 05/28/2016   Gallstones    Normal pregnancy 11/21/2014   Echogenic intracardiac focus of fetus on prenatal  ultrasound    Obesity complicating pregnancy    Pre-existing essential hypertension complicating pregnancy in third trimester    [redacted] weeks gestation of pregnancy    [redacted] weeks gestation of pregnancy    Chronic hypertension complicating or reason for care during pregnancy    Obesity in pregnancy, antepartum    [redacted] weeks gestation of pregnancy    PCP:  Philemon Kingdom, MD Pharmacy:   [facility] [address] [place] [address] Phone: [phone] Fax: [phone]  [facility] [address] [place], [place] - [address] [place] [address] Phone: [phone] Fax: [phone]     Social Determinants of Health (SDOH) Interventions    Readmission Risk Interventions No flowsheet data found.

## 2021-09-25 NOTE — Evaluation (Signed)
Occupational Therapy Evaluation Patient Details Name: Taylor [name] MRN: [id number] DOB: 03/09/1986 Today's Date: 09/25/2021   History of Present Illness PMH significant for anxiety, hypertension, depression, GERD, hypothyroid. Pt admitted for R LE ACL reconstruction along with medial meninsus repair.   Clinical Impression   [name] was seen for OT evaluation this date. Prior to hospital admission, pt was independent in home, has not been able to work since injury in August. Pt lives w/ spouse and children. Currently pt demonstrates impairments as described below (See OT problem list) which functionally limit her ability to perform ADL/self-care tasks. Pt currently requires MOD I for LBD, don/doff socks. CGA + RW for toilet t/f, ambulating to [facility]. MOD I for perihygine. Pt able to maintain FFWB status w/ no cues. Upon hospital discharge, recommend no OT follow-up. Pt has good carryover for ADLs from previous hospital stay. Pt with good family support, all needed DME, all education complete, will sign off.       Recommendations for follow up therapy are one component of a multi-disciplinary discharge planning process, led by the attending physician.  Recommendations may be updated based on patient status, additional functional criteria and insurance authorization.   Follow Up Recommendations  No OT follow up    Assistance Recommended at Discharge Intermittent Supervision/Assistance  Functional Status Assessment  Patient has had a recent decline in their functional status and demonstrates the ability to make significant improvements in function in a reasonable and predictable amount of time.  Equipment Recommendations  None recommended by OT;Other (comment) (Pt has all equipment at home)    Recommendations for Other Services       Precautions / Restrictions Precautions Precautions: Fall Required Braces or Orthoses: Knee Immobilizer - Right Knee Immobilizer - Right: On at all  times Restrictions Weight Bearing Restrictions: Yes RLE Weight Bearing:  (FFWB and brace locked in extension)      Mobility Bed Mobility Overal bed mobility: Needs Assistance Bed Mobility: Supine to Sit;Sit to Supine     Supine to sit: Supervision;HOB elevated Sit to supine: Min assist (Assist for LLE)        Transfers Overall transfer level: Needs assistance Equipment used: Rolling walker (2 wheels) Transfers: Sit to/from Stand Sit to Stand: Supervision                  Balance Overall balance assessment: Needs assistance Sitting-balance support: Feet supported;No upper extremity supported Sitting balance-Leahy Scale: Good     Standing balance support: Bilateral upper extremity supported Standing balance-Leahy Scale: Fair                             ADL either performed or assessed with clinical judgement   ADL Overall ADL's : Needs assistance/impaired                                       General ADL Comments: Pt MOD I for LBD, don/doff socks. CGA + RW for toilet t/f, ambulating to [facility]. MOD I for perihygine.      Pertinent Vitals/Pain Pain Assessment: 0-10 Pain Score: 8  Pain Location: back of leg Pain Descriptors / Indicators: Discomfort;Sore Pain Intervention(s): Premedicated before session;Limited activity within patient's tolerance;Monitored during session;Repositioned     Hand Dominance     Extremity/Trunk Assessment Upper Extremity Assessment Upper Extremity Assessment: Overall WFL for tasks assessed  Lower Extremity Assessment Lower Extremity Assessment: Generalized weakness;RLE deficits/detail RLE:  (s/p ACL reconstruction)       Communication Communication Communication: No difficulties   Cognition Arousal/Alertness: Awake/alert Behavior During Therapy: WFL for tasks assessed/performed Overall Cognitive Status: Within Functional Limits for tasks assessed                                           Exercises Exercises: Other exercises Other Exercises Other Exercises: Pt educ re: OT role, d/c recs, DME use, polar care, falls prevention Other Exercises: Sup<>sit, polar care, sit<>stand, Toilet t/f, LBD   Shoulder Instructions      Home Living Family/patient expects to be discharged to:: Private residence Living Arrangements: Spouse/significant other;Children Available Help at Discharge: Family;Available 24 hours/day Type of Home: House Home Access: Ramped entrance     Home Layout: One level     Bathroom Shower/Tub: Chief Strategy Officer: Standard     Home Equipment: Agricultural consultant (2 wheels);BSC/3in1;Shower seat;Wheelchair - manual;Toilet riser          Prior Functioning/Environment Prior Level of Function : Independent/Modified Independent             Mobility Comments: went through OP PT and was able to ambulate without AD prior to surgery. L leg treated with conservative technique ADLs Comments: hasn't been able to work since initial injury in Aug 2022, hasn't been able to do grocery shopping, needs assist for community activities, spouse helps with ADLs as needed        OT Problem List: Decreased strength;Decreased range of motion;Decreased activity tolerance      OT Treatment/Interventions:      OT Goals(Current goals can be found in the care plan section) Acute Rehab OT Goals Patient Stated Goal: To get better OT Goal Formulation: With patient/family Time For Goal Achievement: 10/09/21 Potential to Achieve Goals: Good                AM-PAC OT "6 Clicks" Daily Activity     Outcome Measure Help from another person eating meals?: None Help from another person taking care of personal grooming?: A Little Help from another person toileting, which includes using toliet, bedpan, or urinal?: A Lot Help from another person bathing (including washing, rinsing, drying)?: A Little Help from another person to put on and taking off  regular upper body clothing?: A Little Help from another person to put on and taking off regular lower body clothing?: A Little 6 Click Score: 18   End of Session Equipment Utilized During Treatment: Gait belt;Rolling walker (2 wheels)  Activity Tolerance: Patient tolerated treatment well Patient left: in bed;with call bell/phone within reach;with bed alarm set;with family/visitor present  OT Visit Diagnosis: Unsteadiness on feet (R26.81);Other abnormalities of gait and mobility (R26.89);Muscle weakness (generalized) (M62.81)                Time: 5053-9767 OT Time Calculation (min): 26 min Charges:  OT General Charges $OT Visit: 1 Visit OT Evaluation $OT Eval Low Complexity: 1 Low OT Treatments $Self Care/Home Management : 8-22 mins  [place] [facility], Adine Madura 09/25/2021, 10:13 AM

## 2021-09-25 NOTE — Progress Notes (Signed)
  Subjective: 1 Day Post-Op Procedure(s) (LRB): Right arthroscopic ACL reconstruction using quadriceps tendon autograft, chondroplasty (Right) KENALOG INJECTION (Left) KNEE ARTHROSCOPY WITH MEDIAL MENISCAL REPAIR (Left) Patient reports pain as moderate.   Patient is well, and has had no acute complaints or problems Plan is to go Home after hospital stay. Negative for chest pain and shortness of breath Fever: no Gastrointestinal: Negative for nausea and vomiting  Objective: Vital signs in last 24 hours: Temp:  [97.6 F (36.4 C)-98.4 F (36.9 C)] 97.6 F (36.4 C) (12/13 0404) Pulse Rate:  [58-102] 72 (12/13 0404) Resp:  [8-22] 16 (12/13 0404) BP: (122-164)/(68-135) 131/84 (12/13 0404) SpO2:  [85 %-100 %] 100 % (12/13 0404) Weight:  [122.5 kg] 122.5 kg (12/12 1708)  Intake/Output from previous day:  Intake/Output Summary (Last 24 hours) at 09/25/2021 0702 Last data filed at 09/25/2021 0255 Gross per 24 hour  Intake 1557.18 ml  Output 5 ml  Net 1552.18 ml    Intake/Output this shift: No intake/output data recorded.  Labs: No results for input(s): HGB in the last 72 hours. No results for input(s): WBC, RBC, HCT, PLT in the last 72 hours. No results for input(s): NA, K, CL, CO2, BUN, CREATININE, GLUCOSE, CALCIUM in the last 72 hours. No results for input(s): LABPT, INR in the last 72 hours.   EXAM General - Patient is alert and oriented Extremity - Neurovascular intact Sensation intact distally Dorsiflexion/Plantar flexion intact Compartment soft Dressing/Incision - clean, dry, no drainage Motor Function - intact, moving foot and toes well on exam.   Past Medical History:  Diagnosis Date   Anxiety    Complication of anesthesia    Hard to put under anesthesia, combative when waking up, heart palpitations under anesthesia   Depression    Essential hypertension 08/08/2016   GERD (gastroesophageal reflux disease)    H/O back injury    broken back age 58    Hypertension    with pregnancy   Hypothyroidism     Assessment/Plan: 1 Day Post-Op Procedure(s) (LRB): Right arthroscopic ACL reconstruction using quadriceps tendon autograft, chondroplasty (Right) KENALOG INJECTION (Left) KNEE ARTHROSCOPY WITH MEDIAL MENISCAL REPAIR (Left) Principal Problem:   S/P ACL reconstruction  Estimated body mass index is 43.59 kg/m as calculated from the following:   Height as of this encounter: 5\' 6"  (1.676 m).   Weight as of this encounter: 122.5 kg. Advance diet Up with therapy  Discharge planning to go home with outpatient physical therapy.  Plan to discharge today after physical therapy.  DVT Prophylaxis - Aspirin Touchdown Weight-Bearing to right leg  , PA-C Orthopaedic Surgery 09/25/2021, 7:02 AM

## 2021-09-25 NOTE — ED Provider Notes (Signed)
[facility] Emergency Department Provider Note  ____________________________________________  Time seen: Approximately 4:55 PM  I have reviewed the triage vital signs and the nursing notes.   HISTORY  Chief Complaint Knee Injury    HPI Taylor [name] is a 35 y.o. female who presents the emergency department concern for possible injury to her right knee.  Patient had surgery for ACL repair as well as a meniscal tear yesterday.  Patient required admission after her surgery for pain control.  She was discharged this morning states that as she was entering the house her left leg which is the unaffected leg caught on the ledge causing her to slip and she bent her knee.  Patient is concerned that she may have done trauma/damage to her knee and presents for evaluation.       Past Medical History:  Diagnosis Date   Anxiety    Complication of anesthesia    Hard to put under anesthesia, combative when waking up, heart palpitations under anesthesia   Depression    Essential hypertension 08/08/2016   GERD (gastroesophageal reflux disease)    H/O back injury    broken back age 8   Hypertension    with pregnancy   Hypothyroidism     Patient Active Problem List   Diagnosis Date Noted   S/P ACL reconstruction 09/24/2021   Right medial tibial plateau fracture 05/28/2021   Patellar fracture 05/28/2021   S/P laparoscopic sleeve gastrectomy Dec 2018 09/16/2017   Morbid obesity (HCC) 09/16/2017   Essential hypertension 08/08/2016   PVCs (premature ventricular contractions) 08/06/2016   Cholecystitis 06/03/2016   Abdominal pain 05/28/2016   Gallstones    Normal pregnancy 11/21/2014   Echogenic intracardiac focus of fetus on prenatal ultrasound    Obesity complicating pregnancy    Pre-existing essential hypertension complicating pregnancy in third trimester    [redacted] weeks gestation of pregnancy    [redacted] weeks gestation of pregnancy    Chronic hypertension  complicating or reason for care during pregnancy    Obesity in pregnancy, antepartum    [redacted] weeks gestation of pregnancy     Past Surgical History:  Procedure Laterality Date   CESAREAN SECTION N/A 11/22/2014   Procedure: CESAREAN SECTION;  Surgeon: Turner Daniels, MD;  Location: [facility];  Service: Obstetrics;  Laterality: N/A;   CHOLECYSTECTOMY N/A 05/29/2016   Procedure: LAPAROSCOPIC CHOLECYSTECTOMY;  Surgeon: Tiney Rouge III, MD;  Location: [facility] ORS;  Service: General;  Laterality: N/A;   KENALOG INJECTION Left 09/24/2021   Procedure: KENALOG INJECTION;  Surgeon: Signa Kell, MD;  Location: [facility] ORS;  Service: Orthopedics;  Laterality: Left;   KNEE ARTHROSCOPY WITH ANTERIOR CRUCIATE LIGAMENT (ACL) REPAIR WITH HAMSTRING GRAFT Right 09/24/2021   Procedure: Right arthroscopic ACL reconstruction using quadriceps tendon autograft, chondroplasty;  Surgeon: Signa Kell, MD;  Location: [facility] OR[facility];  Service: Orthopedics;  Laterality: Right;   KNEE ARTHROSCOPY WITH MENISCAL REPAIR Left 09/24/2021   Procedure: KNEE ARTHROSCOPY WITH MEDIAL MENISCAL REPAIR;  Surgeon: Signa Kell, MD;  Location: [facility] OR[facility];  Service: Orthopedics;  Laterality: Left;   LAPAROSCOPIC GASTRIC SLEEVE RESECTION N/A 09/16/2017   Procedure: LAPAROSCOPIC GASTRIC SLEEVE RESECTION, UPPER ENDO;  Surgeon: Luretha Murphy, MD;  Location: [facility];  Service: General;  Laterality: N/A;   LAPAROSCOPIC OVARIAN CYSTECTOMY Right    LAPAROSCOPY Left 08/06/2016   Procedure: LAPAROSCOPY DIAGNOSTIC with drainage of left ovarian cyst;  Surgeon: Marcelle Overlie, MD;  Location: [facility];  Service: Gynecology;  Laterality: Left;   WISDOM TOOTH  EXTRACTION      Prior to Admission medications   Medication Sig Start Date End Date Taking? Authorizing Provider  acetaminophen (TYLENOL) 500 MG tablet Take 2 tablets (1,000 mg total) by mouth every 8 (eight) hours. 09/24/21 09/24/22  Signa Kell, MD  ALPRAZolam Prudy Feeler) 0.25 MG tablet Take 0.25 mg by mouth 2 (two)  times daily as needed for anxiety.    [provider]  aspirin EC 325 MG tablet Take 1 tablet (325 mg total) by mouth daily for 28 days. 09/24/21 10/22/21  Signa Kell, MD  cyanocobalamin (,VITAMIN B-12,) 1000 MCG/ML injection Inject 1,000 mcg into the muscle every 30 (thirty) days.    [provider]  diazepam (VALIUM) 5 MG tablet Take 1 tablet (5 mg total) by mouth every 8 (eight) hours as needed for muscle spasms. 09/24/21 09/24/22  Signa Kell, MD  ergocalciferol (VITAMIN D2) 1.25 MG (50000 UT) capsule take 1 capsule by mouth Once a week Patient taking differently: Take 50,000 Units by mouth every Wednesday. 02/08/21     famotidine (PEPCID) 20 MG tablet 1 tablet for breakthrough heartburn Orally twice a day (bid) as needed (prn) 90 days Patient taking differently: Take 20 mg by mouth daily. 09/11/21     FLUoxetine (PROZAC) 40 MG capsule 1 capsule in the morning Orally Once a day 90 days 09/11/21     gabapentin (NEURONTIN) 300 MG capsule Take 1 capsule (300 mg total) by mouth 3 (three) times daily for 5 days. 09/24/21 09/29/21  Signa Kell, MD  hydrochlorothiazide (MICROZIDE) 12.5 MG capsule Take 12.5 mg by mouth daily.    [provider]  ibuprofen (ADVIL) 800 MG tablet Take 1 tablet (800 mg total) by mouth 3 (three) times daily for 14 days. 09/24/21 10/08/21  Signa Kell, MD  levonorgestrel (MIRENA) 20 MCG/24HR IUD 1 each by Intrauterine route once.    [provider]  levothyroxine (SYNTHROID) 112 MCG tablet 1 tablet Orally Once a day 90 days 09/11/21     Liraglutide -Weight Management (SAXENDA) 18 MG/3ML SOPN 3 mg Subcutaneous once a day Patient not taking: Reported on 09/18/2021 09/11/21     Liraglutide -Weight Management 18 MG/3ML SOPN INJECT 3 MG INTO THE SKIN ONCE A DAY Patient not taking: Reported on 09/12/2021 11/16/20 11/16/21  Philemon Kingdom, MD  methocarbamol (ROBAXIN) 750 MG tablet Take 750 mg by mouth every 8 (eight) hours as needed for muscle  spasms.    [provider]  Multiple Vitamins-Minerals (MULTIVITAMIN WITH MINERALS) tablet Take 1 tablet by mouth daily.    [provider]  ondansetron (ZOFRAN-ODT) 4 MG disintegrating tablet Take 1 tablet (4 mg total) by mouth every 8 (eight) hours as needed for nausea or vomiting. 09/24/21   Signa Kell, MD  oxyCODONE (ROXICODONE) 5 MG immediate release tablet Take 1-2 tablets (5-10 mg total) by mouth every 4 (four) hours as needed (pain). 09/24/21 09/24/22  Signa Kell, MD  pantoprazole (PROTONIX) 40 MG tablet 1 tablet Orally Once a day 90 days 09/11/21     phentermine (ADIPEX-P) 37.5 MG tablet Take 37.5 mg by mouth daily before breakfast.    [provider]    Allergies Keflex [cephalexin]  Family History  Problem Relation Age of Onset   Hypertension Mother    Hypertension Father    CAD Father    Heart disease Maternal Grandmother    Hypertension Maternal Grandmother    Stroke Maternal Grandmother    Cancer Maternal Grandmother        breast colon  Atrial fibrillation Maternal Grandmother    Valvular heart disease Maternal Grandmother    Hypertension Maternal Grandfather    Diabetes Maternal Grandfather    Hypertension Paternal Grandmother    Cancer Paternal Grandmother        kidney   Hypertension Paternal Grandfather     Social History Social History   Tobacco Use   Smoking status: Former    Packs/day: 1.00    Years: 10.00    Pack years: 10.00    Types: Cigarettes    Quit date: 07/29/2014    Years since quitting: 7.1   Smokeless tobacco: Never  Vaping Use   Vaping Use: Never used  Substance Use Topics   Alcohol use: No   Drug use: No     Review of Systems  Constitutional: No fever/chills Eyes: No visual changes. No discharge ENT: No upper respiratory complaints. Cardiovascular: no chest pain. Respiratory: no cough. No SOB. Gastrointestinal: No abdominal pain.  No nausea, no vomiting.  No diarrhea.  No  constipation. Musculoskeletal: Positive for right knee injury Skin: Negative for rash, abrasions, lacerations, ecchymosis. Neurological: Negative for headaches, focal weakness or numbness.  10 System ROS otherwise negative.  ____________________________________________   PHYSICAL EXAM:  VITAL SIGNS: ED Triage Vitals  Enc Vitals Group     BP 09/25/21 1435 (!) 139/96     Pulse Rate 09/25/21 1435 72     Resp 09/25/21 1435 18     Temp 09/25/21 1435 98.3 F (36.8 C)     Temp Source 09/25/21 1435 Oral     SpO2 09/25/21 1435 100 %     Weight 09/25/21 1436 300 lb (136.1 kg)     Height 09/25/21 1436 5\' 6"  (1.676 m)     Head Circumference --      Peak Flow --      Pain Score 09/25/21 1436 10     Pain Loc --      Pain Edu? --      Excl. in GC? --      Constitutional: Alert and oriented. Well appearing and in no acute distress. Eyes: Conjunctivae are normal. PERRL. EOMI. Head: Atraumatic. ENT:      Ears:       Nose: No congestion/rhinnorhea.      Mouth/Throat: Mucous membranes are moist.  Neck: No stridor.    Cardiovascular: Normal rate, regular rhythm. Normal S1 and S2.  Good peripheral circulation. Respiratory: Normal respiratory effort without tachypnea or retractions. Lungs CTAB. Good air entry to the bases with no decreased or absent breath sounds. Musculoskeletal: Full range of motion to all extremities. No gross deformities appreciated.  Visualization of the right knee reveals brace in place.  No open wounds.  Dressing in place from her surgery yesterday.  Dorsalis pedis pulses sensation intact distally Neurologic:  Normal speech and language. No gross focal neurologic deficits are appreciated.  Skin:  Skin is warm, dry and intact. No rash noted. Psychiatric: Mood and affect are normal. Speech and behavior are normal. Patient exhibits appropriate insight and judgement.   ____________________________________________   LABS (all labs ordered are listed, but only abnormal  results are displayed)  Labs Reviewed - No data to display ____________________________________________  EKG   ____________________________________________  RADIOLOGY I personally viewed and evaluated these images as part of my medical decision making, as well as reviewing the written report by the radiologist.  ED Provider Interpretation: Postsurgical changes identified on x-ray.  No acute findings  DG Knee Complete 4 Views Right  Result  Date: 09/25/2021 CLINICAL DATA:  ACL repair yesterday.  Fall. EXAM: RIGHT KNEE - COMPLETE 4+ VIEW COMPARISON:  05/28/2021 plain film. FINDINGS: The attempted lateral view is oblique. Osseous irregularity about the lateral tibia on the AP view is similar to on the prior exam and corresponded to a lateral tibial plateau fracture on subsequent CT. Prepatellar soft tissue swelling. Limited evaluation for joint effusion secondary to obliquity and patient body habitus. IMPRESSION: Limited radiographs, secondary to patient positioning and body habitus. No gross acute injury identified, with similar osseous irregularity involving the lateral tibial plateau. If patient's symptoms warrant, consider further evaluation with CT. Electronically Signed   By: Jeronimo Greaves M.D.   On: 09/25/2021 18:32    ____________________________________________    PROCEDURES  Procedure(s) performed:    Procedures    Medications  morphine 4 MG/ML injection 4 mg (has no administration in time range)  morphine 4 MG/ML injection 4 mg (4 mg Intramuscular Given 09/25/21 1801)  ondansetron (ZOFRAN-ODT) disintegrating tablet 8 mg (8 mg Oral Given 09/25/21 1801)     ____________________________________________   INITIAL IMPRESSION / ASSESSMENT AND PLAN / ED COURSE  Pertinent labs & imaging results that were available during my care of the patient were reviewed by me and considered in my medical decision making (see chart for details).  Review of the [place] CSRS was performed in  accordance of the [facility] prior to dispensing any controlled drugs.           Patient's diagnosis is consistent with knee injury.  Patient had ACL repair, meniscal injury repair performed yesterday.  She was receiving home when she bent her knee.  Patient presented for evaluation.  I discussed the patient with Dr. Allena Katz who happened to be on-call for surgery but actually performed her surgery yesterday.  Allena Katz advises to perform an x-ray to ensure there is been no change in the blood and location as well as to ensure there is been no acute osseous injury.  He recommended against MRI given the timing from her surgery.  At this time it would show postsurgical changes with blood and edema and would not provide any additional information about an acute injury.  Patient reports that her brace was bent, it does appear that the lower arm of the medial side had been bent slightly.  This was straightened.  I discussed with orthopedic surgeon who advises that we would not be able to obtain a brace tonight but she will follow-up in the office tomorrow where they will evaluate her knee and put her in a new brace..    Patient is given ED precautions to return to the ED for any worsening or new symptoms.     ____________________________________________  FINAL CLINICAL IMPRESSION(S) / ED DIAGNOSES  Final diagnoses:  Injury of right knee, initial encounter  S/P ACL repair      NEW MEDICATIONS STARTED DURING THIS VISIT:  ED Discharge Orders     None           This chart was dictated using voice recognition software/Dragon. Despite best efforts to proofread, errors can occur which can change the meaning. Any change was purely unintentional.    Lanette Hampshire 09/25/21 2007    Chesley Noon, MD 09/25/21 2352

## 2021-09-25 NOTE — Progress Notes (Signed)
Physical Therapy Treatment Patient Details Name: Taylor [name] MRN: [id number] DOB: 1986-07-09 Today's Date: 09/25/2021   History of Present Illness PMH significant for anxiety, hypertension, depression, GERD, hypothyroid. Pt admitted for R LE ACL reconstruction along with medial meninsus repair.    PT Comments    Pt is making good progress towards goals and is safe to dc this date. Pain managed this date. Improved tolerance for mobility efforts and able to maintain correct WBing status. Discussed transfers and threshold entry. Pt reports she has OP PT scheduled to resume after dc. Secure chat sent to MD of the above.    Recommendations for follow up therapy are one component of a multi-disciplinary discharge planning process, led by the attending physician.  Recommendations may be updated based on patient status, additional functional criteria and insurance authorization.  Follow Up Recommendations  Outpatient PT     Assistance Recommended at Discharge Intermittent Supervision/Assistance  Equipment Recommendations  None recommended by PT    Recommendations for Other Services       Precautions / Restrictions Precautions Precautions: Fall Required Braces or Orthoses: Knee Immobilizer - Right Knee Immobilizer - Right: On at all times Restrictions Weight Bearing Restrictions: Yes RLE Weight Bearing:  (FFWB with brace locked in extension)     Mobility  Bed Mobility Overal bed mobility: Needs Assistance Bed Mobility: Supine to Sit;Sit to Supine     Supine to sit: Supervision;HOB elevated Sit to supine: Min guard   General bed mobility comments: needs assist for surgical leg. Brace locked in extension and adjusted prior to mobility    Transfers Overall transfer level: Needs assistance Equipment used: Rolling walker (2 wheels) Transfers: Sit to/from Stand Sit to Stand: Supervision           General transfer comment: safe technique. Once standing upright posture  noted    Ambulation/Gait Ambulation/Gait assistance: Min guard Gait Distance (Feet): 40 Feet Assistive device: Rolling walker (2 wheels) Gait Pattern/deviations: Step-to pattern       General Gait Details: able to maintain correct WBing precautions and no increase in pain.   Stairs             Wheelchair Mobility    Modified Rankin (Stroke Patients Only)       Balance Overall balance assessment: Needs assistance Sitting-balance support: Feet supported;No upper extremity supported Sitting balance-Leahy Scale: Good     Standing balance support: Bilateral upper extremity supported Standing balance-Leahy Scale: Fair                              Cognition Arousal/Alertness: Awake/alert Behavior During Therapy: WFL for tasks assessed/performed Overall Cognitive Status: Within Functional Limits for tasks assessed                                 General Comments: more alert this date        Exercises Other Exercises Other Exercises: Pt educ re: OT role, d/c recs, DME use, polar care, falls prevention Other Exercises: Sup<>sit, polar care, sit<>stand, Toilet t/f, LBD    General Comments        Pertinent Vitals/Pain Pain Assessment: 0-10 Pain Score: 8  Pain Location: back of leg Pain Descriptors / Indicators: Discomfort;Sore Pain Intervention(s): Limited activity within patient's tolerance;Monitored during session;Premedicated before session;Repositioned    Home Living Family/patient expects to be discharged to:: Private residence Living Arrangements: Spouse/significant other;Children Available  Help at Discharge: Family;Available 24 hours/day Type of Home: House Home Access: Ramped entrance       Home Layout: One level Home Equipment: Agricultural consultant (2 wheels);BSC/3in1;Shower seat;Wheelchair - manual;Toilet riser      Prior Function            PT Goals (current goals can now be found in the care plan section) Acute  Rehab PT Goals Patient Stated Goal: to return home PT Goal Formulation: With patient Time For Goal Achievement: 10/08/21 Potential to Achieve Goals: Good Progress towards PT goals: Progressing toward goals    Frequency    BID      PT Plan Current plan remains appropriate    Co-evaluation              AM-PAC PT "6 Clicks" Mobility   Outcome Measure  Help needed turning from your back to your side while in a flat bed without using bedrails?: A Little Help needed moving from lying on your back to sitting on the side of a flat bed without using bedrails?: A Little Help needed moving to and from a bed to a chair (including a wheelchair)?: A Little Help needed standing up from a chair using your arms (e.g., wheelchair or bedside chair)?: A Little Help needed to walk in hospital room?: A Little Help needed climbing 3-5 steps with a railing? : A Lot 6 Click Score: 17    End of Session Equipment Utilized During Treatment: Gait belt Activity Tolerance: Patient tolerated treatment well Patient left: in bed Nurse Communication: Mobility status PT Visit Diagnosis: Muscle weakness (generalized) (M62.81);Difficulty in walking, not elsewhere classified (R26.2);Pain Pain - Right/Left: Right Pain - part of body: Leg     Time: 8676-7209 PT Time Calculation (min) (ACUTE ONLY): 25 min  Charges:  $Gait Training: 23-37 mins                     Elizabeth Palau, [place], DPT [phone]    Taylor [name] 09/25/2021, 10:28 AM

## 2021-09-25 NOTE — ED Notes (Signed)
Pt d/c today s/p right knee repair. Pt up walking and knee gave out. Right knee bent. Pt able to wiggle toes, sensation intact, 1+ pulse right dp, 2+ left

## 2021-09-26 NOTE — Progress Notes (Signed)
error 

## 2021-10-01 DIAGNOSIS — S82009A Unspecified fracture of unspecified patella, initial encounter for closed fracture: Secondary | ICD-10-CM | POA: Diagnosis not present

## 2021-10-01 DIAGNOSIS — S82131A Displaced fracture of medial condyle of right tibia, initial encounter for closed fracture: Secondary | ICD-10-CM | POA: Diagnosis not present

## 2021-10-03 DIAGNOSIS — R2689 Other abnormalities of gait and mobility: Secondary | ICD-10-CM | POA: Diagnosis not present

## 2021-10-03 DIAGNOSIS — M25661 Stiffness of right knee, not elsewhere classified: Secondary | ICD-10-CM | POA: Diagnosis not present

## 2021-10-03 DIAGNOSIS — M25561 Pain in right knee: Secondary | ICD-10-CM | POA: Diagnosis not present

## 2021-10-07 DIAGNOSIS — S82131A Displaced fracture of medial condyle of right tibia, initial encounter for closed fracture: Secondary | ICD-10-CM | POA: Diagnosis not present

## 2021-10-07 DIAGNOSIS — S82009A Unspecified fracture of unspecified patella, initial encounter for closed fracture: Secondary | ICD-10-CM | POA: Diagnosis not present

## 2021-10-10 ENCOUNTER — Other Ambulatory Visit: Payer: Self-pay

## 2021-10-10 DIAGNOSIS — S83511A Sprain of anterior cruciate ligament of right knee, initial encounter: Secondary | ICD-10-CM | POA: Diagnosis not present

## 2021-10-10 DIAGNOSIS — S83512A Sprain of anterior cruciate ligament of left knee, initial encounter: Secondary | ICD-10-CM | POA: Diagnosis not present

## 2021-10-11 ENCOUNTER — Other Ambulatory Visit: Payer: Self-pay

## 2021-10-11 DIAGNOSIS — M25661 Stiffness of right knee, not elsewhere classified: Secondary | ICD-10-CM | POA: Diagnosis not present

## 2021-10-11 DIAGNOSIS — R2689 Other abnormalities of gait and mobility: Secondary | ICD-10-CM | POA: Diagnosis not present

## 2021-10-11 DIAGNOSIS — M25561 Pain in right knee: Secondary | ICD-10-CM | POA: Diagnosis not present

## 2021-10-11 MED ORDER — PHENTERMINE HCL 37.5 MG PO TABS
ORAL_TABLET | ORAL | 1 refills | Status: AC
  Filled 2021-10-11: qty 30, 30d supply, fill #0

## 2021-10-12 DIAGNOSIS — M25661 Stiffness of right knee, not elsewhere classified: Secondary | ICD-10-CM | POA: Diagnosis not present

## 2021-10-12 DIAGNOSIS — R2689 Other abnormalities of gait and mobility: Secondary | ICD-10-CM | POA: Diagnosis not present

## 2021-10-12 DIAGNOSIS — M25561 Pain in right knee: Secondary | ICD-10-CM | POA: Diagnosis not present

## 2021-10-16 DIAGNOSIS — M25561 Pain in right knee: Secondary | ICD-10-CM | POA: Diagnosis not present

## 2021-10-16 DIAGNOSIS — R2689 Other abnormalities of gait and mobility: Secondary | ICD-10-CM | POA: Diagnosis not present

## 2021-10-16 DIAGNOSIS — M25661 Stiffness of right knee, not elsewhere classified: Secondary | ICD-10-CM | POA: Diagnosis not present

## 2021-10-18 ENCOUNTER — Other Ambulatory Visit (HOSPITAL_COMMUNITY): Payer: Self-pay

## 2021-10-18 ENCOUNTER — Other Ambulatory Visit: Payer: Self-pay

## 2021-10-18 DIAGNOSIS — R2689 Other abnormalities of gait and mobility: Secondary | ICD-10-CM | POA: Diagnosis not present

## 2021-10-18 DIAGNOSIS — M25561 Pain in right knee: Secondary | ICD-10-CM | POA: Diagnosis not present

## 2021-10-18 DIAGNOSIS — M25661 Stiffness of right knee, not elsewhere classified: Secondary | ICD-10-CM | POA: Diagnosis not present

## 2021-10-23 DIAGNOSIS — R2689 Other abnormalities of gait and mobility: Secondary | ICD-10-CM | POA: Diagnosis not present

## 2021-10-23 DIAGNOSIS — M25661 Stiffness of right knee, not elsewhere classified: Secondary | ICD-10-CM | POA: Diagnosis not present

## 2021-10-23 DIAGNOSIS — M25561 Pain in right knee: Secondary | ICD-10-CM | POA: Diagnosis not present

## 2021-10-25 DIAGNOSIS — M25561 Pain in right knee: Secondary | ICD-10-CM | POA: Diagnosis not present

## 2021-10-25 DIAGNOSIS — R2689 Other abnormalities of gait and mobility: Secondary | ICD-10-CM | POA: Diagnosis not present

## 2021-10-25 DIAGNOSIS — M25661 Stiffness of right knee, not elsewhere classified: Secondary | ICD-10-CM | POA: Diagnosis not present

## 2021-10-27 ENCOUNTER — Other Ambulatory Visit (HOSPITAL_COMMUNITY): Payer: Self-pay

## 2021-10-29 DIAGNOSIS — M25661 Stiffness of right knee, not elsewhere classified: Secondary | ICD-10-CM | POA: Diagnosis not present

## 2021-10-29 DIAGNOSIS — R2689 Other abnormalities of gait and mobility: Secondary | ICD-10-CM | POA: Diagnosis not present

## 2021-10-29 DIAGNOSIS — M25561 Pain in right knee: Secondary | ICD-10-CM | POA: Diagnosis not present

## 2021-10-31 ENCOUNTER — Other Ambulatory Visit (HOSPITAL_COMMUNITY): Payer: Self-pay

## 2021-11-01 DIAGNOSIS — M25661 Stiffness of right knee, not elsewhere classified: Secondary | ICD-10-CM | POA: Diagnosis not present

## 2021-11-01 DIAGNOSIS — M25561 Pain in right knee: Secondary | ICD-10-CM | POA: Diagnosis not present

## 2021-11-01 DIAGNOSIS — R2689 Other abnormalities of gait and mobility: Secondary | ICD-10-CM | POA: Diagnosis not present

## 2021-11-06 DIAGNOSIS — R2689 Other abnormalities of gait and mobility: Secondary | ICD-10-CM | POA: Diagnosis not present

## 2021-11-06 DIAGNOSIS — M25561 Pain in right knee: Secondary | ICD-10-CM | POA: Diagnosis not present

## 2021-11-06 DIAGNOSIS — M25661 Stiffness of right knee, not elsewhere classified: Secondary | ICD-10-CM | POA: Diagnosis not present

## 2021-11-07 ENCOUNTER — Other Ambulatory Visit (HOSPITAL_COMMUNITY): Payer: Self-pay

## 2021-11-09 DIAGNOSIS — M25661 Stiffness of right knee, not elsewhere classified: Secondary | ICD-10-CM | POA: Diagnosis not present

## 2021-11-09 DIAGNOSIS — M25561 Pain in right knee: Secondary | ICD-10-CM | POA: Diagnosis not present

## 2021-11-09 DIAGNOSIS — R2689 Other abnormalities of gait and mobility: Secondary | ICD-10-CM | POA: Diagnosis not present

## 2021-11-12 ENCOUNTER — Other Ambulatory Visit (HOSPITAL_COMMUNITY): Payer: Self-pay

## 2021-11-13 DIAGNOSIS — R2689 Other abnormalities of gait and mobility: Secondary | ICD-10-CM | POA: Diagnosis not present

## 2021-11-13 DIAGNOSIS — M25661 Stiffness of right knee, not elsewhere classified: Secondary | ICD-10-CM | POA: Diagnosis not present

## 2021-11-13 DIAGNOSIS — M25561 Pain in right knee: Secondary | ICD-10-CM | POA: Diagnosis not present

## 2021-11-16 DIAGNOSIS — M25661 Stiffness of right knee, not elsewhere classified: Secondary | ICD-10-CM | POA: Diagnosis not present

## 2021-11-16 DIAGNOSIS — M25561 Pain in right knee: Secondary | ICD-10-CM | POA: Diagnosis not present

## 2021-11-16 DIAGNOSIS — R2689 Other abnormalities of gait and mobility: Secondary | ICD-10-CM | POA: Diagnosis not present

## 2021-11-19 DIAGNOSIS — M25661 Stiffness of right knee, not elsewhere classified: Secondary | ICD-10-CM | POA: Diagnosis not present

## 2021-11-19 DIAGNOSIS — M25561 Pain in right knee: Secondary | ICD-10-CM | POA: Diagnosis not present

## 2021-11-19 DIAGNOSIS — R2689 Other abnormalities of gait and mobility: Secondary | ICD-10-CM | POA: Diagnosis not present

## 2021-11-20 ENCOUNTER — Other Ambulatory Visit (HOSPITAL_COMMUNITY): Payer: Self-pay

## 2021-11-26 DIAGNOSIS — M25661 Stiffness of right knee, not elsewhere classified: Secondary | ICD-10-CM | POA: Diagnosis not present

## 2021-11-26 DIAGNOSIS — R2689 Other abnormalities of gait and mobility: Secondary | ICD-10-CM | POA: Diagnosis not present

## 2021-11-26 DIAGNOSIS — M25561 Pain in right knee: Secondary | ICD-10-CM | POA: Diagnosis not present

## 2021-11-30 DIAGNOSIS — R2689 Other abnormalities of gait and mobility: Secondary | ICD-10-CM | POA: Diagnosis not present

## 2021-11-30 DIAGNOSIS — M25661 Stiffness of right knee, not elsewhere classified: Secondary | ICD-10-CM | POA: Diagnosis not present

## 2021-11-30 DIAGNOSIS — M25561 Pain in right knee: Secondary | ICD-10-CM | POA: Diagnosis not present

## 2021-12-03 DIAGNOSIS — R2689 Other abnormalities of gait and mobility: Secondary | ICD-10-CM | POA: Diagnosis not present

## 2021-12-03 DIAGNOSIS — M25661 Stiffness of right knee, not elsewhere classified: Secondary | ICD-10-CM | POA: Diagnosis not present

## 2021-12-03 DIAGNOSIS — M25561 Pain in right knee: Secondary | ICD-10-CM | POA: Diagnosis not present

## 2021-12-07 DIAGNOSIS — M25561 Pain in right knee: Secondary | ICD-10-CM | POA: Diagnosis not present

## 2021-12-07 DIAGNOSIS — R2689 Other abnormalities of gait and mobility: Secondary | ICD-10-CM | POA: Diagnosis not present

## 2021-12-07 DIAGNOSIS — M25661 Stiffness of right knee, not elsewhere classified: Secondary | ICD-10-CM | POA: Diagnosis not present

## 2021-12-11 DIAGNOSIS — R2689 Other abnormalities of gait and mobility: Secondary | ICD-10-CM | POA: Diagnosis not present

## 2021-12-11 DIAGNOSIS — M25661 Stiffness of right knee, not elsewhere classified: Secondary | ICD-10-CM | POA: Diagnosis not present

## 2021-12-11 DIAGNOSIS — M25561 Pain in right knee: Secondary | ICD-10-CM | POA: Diagnosis not present

## 2021-12-14 DIAGNOSIS — M25661 Stiffness of right knee, not elsewhere classified: Secondary | ICD-10-CM | POA: Diagnosis not present

## 2021-12-14 DIAGNOSIS — R2689 Other abnormalities of gait and mobility: Secondary | ICD-10-CM | POA: Diagnosis not present

## 2021-12-14 DIAGNOSIS — M25561 Pain in right knee: Secondary | ICD-10-CM | POA: Diagnosis not present

## 2021-12-18 DIAGNOSIS — Z79899 Other long term (current) drug therapy: Secondary | ICD-10-CM | POA: Diagnosis not present

## 2021-12-18 DIAGNOSIS — I10 Essential (primary) hypertension: Secondary | ICD-10-CM | POA: Diagnosis not present

## 2021-12-18 DIAGNOSIS — K219 Gastro-esophageal reflux disease without esophagitis: Secondary | ICD-10-CM | POA: Diagnosis not present

## 2021-12-18 DIAGNOSIS — R7309 Other abnormal glucose: Secondary | ICD-10-CM | POA: Diagnosis not present

## 2021-12-18 DIAGNOSIS — Z9884 Bariatric surgery status: Secondary | ICD-10-CM | POA: Diagnosis not present

## 2021-12-18 DIAGNOSIS — F419 Anxiety disorder, unspecified: Secondary | ICD-10-CM | POA: Diagnosis not present

## 2021-12-18 DIAGNOSIS — E559 Vitamin D deficiency, unspecified: Secondary | ICD-10-CM | POA: Diagnosis not present

## 2021-12-18 DIAGNOSIS — Z6841 Body Mass Index (BMI) 40.0 and over, adult: Secondary | ICD-10-CM | POA: Diagnosis not present

## 2021-12-18 DIAGNOSIS — E039 Hypothyroidism, unspecified: Secondary | ICD-10-CM | POA: Diagnosis not present

## 2021-12-20 ENCOUNTER — Other Ambulatory Visit: Payer: Self-pay

## 2021-12-20 MED ORDER — MOUNJARO 2.5 MG/0.5ML ~~LOC~~ SOAJ
SUBCUTANEOUS | 0 refills | Status: AC
  Filled 2021-12-20 – 2021-12-27 (×2): qty 2, 28d supply, fill #0

## 2021-12-21 ENCOUNTER — Other Ambulatory Visit: Payer: Self-pay

## 2021-12-21 MED ORDER — PHENTERMINE HCL 37.5 MG PO TABS
ORAL_TABLET | ORAL | 0 refills | Status: AC
  Filled 2021-12-21: qty 30, 30d supply, fill #0

## 2021-12-21 MED ORDER — BUPROPION HCL ER (XL) 150 MG PO TB24
ORAL_TABLET | ORAL | 3 refills | Status: AC
  Filled 2021-12-21: qty 30, 30d supply, fill #0

## 2021-12-26 ENCOUNTER — Other Ambulatory Visit: Payer: Self-pay

## 2021-12-26 DIAGNOSIS — M25561 Pain in right knee: Secondary | ICD-10-CM | POA: Diagnosis not present

## 2021-12-26 DIAGNOSIS — M25661 Stiffness of right knee, not elsewhere classified: Secondary | ICD-10-CM | POA: Diagnosis not present

## 2021-12-26 DIAGNOSIS — R2689 Other abnormalities of gait and mobility: Secondary | ICD-10-CM | POA: Diagnosis not present

## 2021-12-27 ENCOUNTER — Other Ambulatory Visit: Payer: Self-pay

## 2021-12-27 DIAGNOSIS — S83512D Sprain of anterior cruciate ligament of left knee, subsequent encounter: Secondary | ICD-10-CM | POA: Diagnosis not present

## 2021-12-27 DIAGNOSIS — S83511D Sprain of anterior cruciate ligament of right knee, subsequent encounter: Secondary | ICD-10-CM | POA: Diagnosis not present

## 2021-12-31 ENCOUNTER — Other Ambulatory Visit: Payer: Self-pay

## 2021-12-31 MED ORDER — WEGOVY 0.25 MG/0.5ML ~~LOC~~ SOAJ
SUBCUTANEOUS | 0 refills | Status: AC
  Filled 2021-12-31: qty 2, 30d supply, fill #0
  Filled 2022-01-09: qty 2, 28d supply, fill #0

## 2022-01-01 ENCOUNTER — Other Ambulatory Visit: Payer: Self-pay | Admitting: Orthopedic Surgery

## 2022-01-02 ENCOUNTER — Other Ambulatory Visit: Payer: Self-pay

## 2022-01-07 ENCOUNTER — Encounter
Admission: RE | Admit: 2022-01-07 | Discharge: 2022-01-07 | Disposition: A | Discharge status: Home / Self Care | Payer: 59 | Source: Ambulatory Visit | Attending: Orthopedic Surgery | Admitting: Orthopedic Surgery

## 2022-01-07 ENCOUNTER — Other Ambulatory Visit: Payer: Self-pay

## 2022-01-07 NOTE — Patient Instructions (Addendum)
Your procedure is scheduled on:01-14-22 Monday ?Report to the Registration Desk on the 1st floor of the [facility].Then proceed to the 2nd floor Surgery Desk in the Medical [facility] ?To find out your arrival time, please call [phone] between 1PM - 3PM on:01-11-22 Friday ? ?REMEMBER: ?Instructions that are not followed completely may result in serious medical risk, up to and including death; or upon the discretion of your surgeon and anesthesiologist your surgery may need to be rescheduled. ? ?Do not eat food after midnight the night before surgery.  ?No gum chewing, lozengers or hard candies. ? ?You may however, drink CLEAR liquids up to 2 hours before you are scheduled to arrive for your surgery. Do not drink anything within 2 hours of your scheduled arrival time. ? ?Clear liquids include: ?- water  ?- apple juice without pulp ?- gatorade (not RED colors) ?- black coffee or tea (Do NOT add milk or creamers to the coffee or tea) ?Do NOT drink anything that is not on this list. ? ?In addition, your doctor has ordered for you to drink the provided  ?Ensure Pre-Surgery Clear Carbohydrate Drink  ?Drinking this carbohydrate drink up to two hours before surgery helps to reduce insulin resistance and improve patient outcomes. Please complete drinking 2 hours prior to scheduled arrival time. ? ?TAKE THESE MEDICATIONS THE MORNING OF SURGERY WITH A SIP OF WATER: ?-buPROPion (WELLBUTRIN XL)  ?-famotidine (PEPCID) ?-FLUoxetine (PROZAC)  ?-levothyroxine (SYNTHROID) ?-pantoprazole (PROTONIX)  ?-You may take ALPRAZolam Prudy Feeler) the morning of surgery if needed ? ?Stop your phentermine (ADIPEX-P) NOW 01-07-22. You may resume after surgery ? ?One week prior to surgery: ?Stop Anti-inflammatories (NSAIDS) such as Advil, Aleve, Ibuprofen, Motrin, Naproxen, Naprosyn and Aspirin based products such as Excedrin, Goodys Powder, BC Powder.You may however, continue to take Tylenol if needed for pain up until the day of surgery. ? ?Stop ANY  OVER THE COUNTER supplements until after surgery (MULTIVITAMIN ) ? ?No Alcohol for 24 hours before or after surgery. ? ?No Smoking including e-cigarettes for 24 hours prior to surgery.  ?No chewable tobacco products for at least 6 hours prior to surgery.  ?No nicotine patches on the day of surgery. ? ?Do not use any "recreational" drugs for at least a week prior to your surgery.  ?Please be advised that the combination of cocaine and anesthesia may have negative outcomes, up to and including death. ?If you test positive for cocaine, your surgery will be cancelled. ? ?On the morning of surgery brush your teeth with toothpaste and water, you may rinse your mouth with mouthwash if you wish. ?Do not swallow any toothpaste or mouthwash. ? ?Use CHG Soap as directed on instruction sheet. ? ?Do not wear jewelry, make-up, hairpins, clips or nail polish. ? ?Do not wear lotions, powders, or perfumes.  ? ?Do not shave body from the neck down 48 hours prior to surgery just in case you cut yourself which could leave a site for infection.  ?Also, freshly shaved skin may become irritated if using the CHG soap. ? ?Contact lenses, hearing aids and dentures may not be worn into surgery. ? ?Do not bring valuables to the hospital. [facility] is not responsible for any missing/lost belongings or valuables.  ? ?Notify your doctor if there is any change in your medical condition (cold, fever, infection). ? ?Wear comfortable clothing (specific to your surgery type) to the hospital. ? ?After surgery, you can help prevent lung complications by doing breathing exercises.  ?Take deep breaths and cough  every 1-2 hours. Your doctor may order a device called an Incentive Spirometer to help you take deep breaths. ?When coughing or sneezing, hold a pillow firmly against your incision with both hands. This is called ?splinting.? Doing this helps protect your incision. It also decreases belly discomfort. ? ?If you are being admitted to the hospital  overnight, leave your suitcase in the car. ?After surgery it may be brought to your room. ? ?If you are being discharged the day of surgery, you will not be allowed to drive home. ?You will need a responsible adult (18 years or older) to drive you home and stay with you that night.  ? ?If you are taking public transportation, you will need to have a responsible adult (18 years or older) with you. ?Please confirm with your physician that it is acceptable to use public transportation.  ? ?Please call the Pre-admissions Testing Dept. at [phone] if you have any questions about these instructions. ? ?Surgery Visitation Policy: ? ?Patients undergoing a surgery or procedure may have two family members or support persons with them as long as the person is not COVID-19 positive or experiencing its symptoms.  ? ?

## 2022-01-08 ENCOUNTER — Other Ambulatory Visit: Payer: Self-pay | Admitting: Orthopedic Surgery

## 2022-01-08 NOTE — Progress Notes (Signed)
error 

## 2022-01-09 ENCOUNTER — Other Ambulatory Visit: Payer: Self-pay

## 2022-01-13 MED ORDER — LACTATED RINGERS IV SOLN: INTRAVENOUS | Status: DC

## 2022-01-13 MED ORDER — CHLORHEXIDINE GLUCONATE 0.12 % MT SOLN: 15.0000 mL | Freq: Once | OROMUCOSAL | Status: AC

## 2022-01-13 MED ORDER — ORAL CARE MOUTH RINSE: 15.0000 mL | Freq: Once | OROMUCOSAL | Status: AC

## 2022-01-13 MED ORDER — GABAPENTIN 300 MG PO CAPS: 300.0000 mg | ORAL_CAPSULE | Freq: Once | ORAL | Status: AC

## 2022-01-13 MED ORDER — CLINDAMYCIN PHOSPHATE 900 MG/50ML IV SOLN
900.0000 mg | INTRAVENOUS | Status: AC
Start: 2022-01-14 — End: 2022-01-14
  Administered 2022-01-14: 900 mg via INTRAVENOUS

## 2022-01-13 MED ORDER — ACETAMINOPHEN 500 MG PO TABS: 1000.0000 mg | ORAL_TABLET | Freq: Once | ORAL | Status: AC

## 2022-01-13 MED ORDER — GABAPENTIN 600 MG PO TABS
600.0000 mg | ORAL_TABLET | Freq: Once | ORAL | Status: DC
Start: 2022-01-13 — End: 2022-01-13
  Filled 2022-01-13: qty 1

## 2022-01-13 MED ORDER — CELECOXIB 200 MG PO CAPS
200.0000 mg | ORAL_CAPSULE | Freq: Once | ORAL | Status: AC
Start: 2022-01-13 — End: 2022-01-14

## 2022-01-13 NOTE — Anesthesia Preprocedure Evaluation (Addendum)
Anesthesia Evaluation  ?Patient identified by MRN, date of birth, ID band ?Patient awake ? ? ? ?Reviewed: ?Allergy & Precautions, NPO status , Patient's Chart, lab work & pertinent test results ? ?History of Anesthesia Complications ?(+) history of anesthetic complications (Hard to put under anesthesia, combative when waking up, heart palpitations under anesthesia) ? ?Airway ?Mallampati: II ? ?TM Distance: >3 FB ?Neck ROM: Full ? ? ? Dental ?no notable dental hx. ? ?  ?Pulmonary ?neg COPD, former smoker,  ?  ?breath sounds clear to auscultation- rhonchi ?(-) wheezing ? ? ? ? ? Cardiovascular ?Exercise Tolerance: Good ?hypertension, (-) CAD and (-) Past MI  ?Rhythm:Regular Rate:Normal ?- Systolic murmurs and - Diastolic murmurs ? ?  ?Neuro/Psych ?PSYCHIATRIC DISORDERS Anxiety Depression negative neurological ROS ?   ? GI/Hepatic ?Neg liver ROS, GERD  Medicated and Controlled,S/p gastric sleeve ?  ?Endo/Other  ?neg diabetesHypothyroidism Morbid obesity ? Renal/GU ?negative Renal ROS  ? ?  ?Musculoskeletal ?negative musculoskeletal ROS ?(+)  ? Abdominal ?(+) + obese,   ?Peds ? Hematology ?negative hematology ROS ?(+)   ?Anesthesia Other Findings ?S/P ACL reconstruction with post-op pain control issues requiring admission. She received post-op femoral (failed) and adductor canal block. ? Reproductive/Obstetrics ? ?  ? ? ? ? ? ? ? ? ? ? ? ? ? ?  ?  ? ? ? ? ? ? ?Anesthesia Physical ? ?Anesthesia Plan ? ?ASA: 3 ? ?Anesthesia Plan: General  ? ?Post-op Pain Management: Regional block*, Ketamine IV*, Tylenol PO (pre-op)*, Celebrex PO (pre-op)* and Gabapentin PO (pre-op)*  ? ?Induction: Intravenous ? ?PONV Risk Score and Plan: 3 and Ondansetron, Dexamethasone and Midazolam ? ?Airway Management Planned: Oral ETT ? ?Additional Equipment:  ? ?Intra-op Plan:  ? ?Post-operative Plan: Extubation in OR ? ?Informed Consent: I have reviewed the patients History and Physical, chart, labs and discussed  the procedure including the risks, benefits and alternatives for the proposed anesthesia with the patient or authorized representative who has indicated his/her understanding and acceptance.  ? ? ? ?Dental advisory given ? ?Plan Discussed with: CRNA ? ?Anesthesia Plan Comments: (Will perform pre-op adductor canal and I-Pack regional block and use ketamine gtt)  ? ? ? ?Anesthesia Quick Evaluation ? ?

## 2022-01-14 ENCOUNTER — Ambulatory Visit: Payer: 59

## 2022-01-14 ENCOUNTER — Encounter
Admission: RE | Disposition: A | Discharge status: Home / Self Care | Payer: Self-pay | Source: Home / Self Care | Attending: Orthopedic Surgery

## 2022-01-14 ENCOUNTER — Ambulatory Visit: Payer: 59 | Admitting: Anesthesiology

## 2022-01-14 ENCOUNTER — Encounter: Payer: Self-pay | Admitting: Orthopedic Surgery

## 2022-01-14 ENCOUNTER — Ambulatory Visit
Admission: RE | Admit: 2022-01-14 | Discharge: 2022-01-14 | Disposition: A | Discharge status: Home / Self Care | Payer: 59 | Attending: Orthopedic Surgery | Admitting: Orthopedic Surgery

## 2022-01-14 ENCOUNTER — Other Ambulatory Visit: Payer: Self-pay

## 2022-01-14 DIAGNOSIS — Z6841 Body Mass Index (BMI) 40.0 and over, adult: Secondary | ICD-10-CM | POA: Diagnosis not present

## 2022-01-14 DIAGNOSIS — M1712 Unilateral primary osteoarthritis, left knee: Secondary | ICD-10-CM | POA: Diagnosis not present

## 2022-01-14 DIAGNOSIS — S72422A Displaced fracture of lateral condyle of left femur, initial encounter for closed fracture: Secondary | ICD-10-CM | POA: Diagnosis not present

## 2022-01-14 DIAGNOSIS — W010XXA Fall on same level from slipping, tripping and stumbling without subsequent striking against object, initial encounter: Secondary | ICD-10-CM | POA: Insufficient documentation

## 2022-01-14 DIAGNOSIS — Z9884 Bariatric surgery status: Secondary | ICD-10-CM | POA: Insufficient documentation

## 2022-01-14 DIAGNOSIS — G8918 Other acute postprocedural pain: Secondary | ICD-10-CM | POA: Diagnosis not present

## 2022-01-14 DIAGNOSIS — Y92009 Unspecified place in unspecified non-institutional (private) residence as the place of occurrence of the external cause: Secondary | ICD-10-CM | POA: Diagnosis not present

## 2022-01-14 DIAGNOSIS — E669 Obesity, unspecified: Secondary | ICD-10-CM | POA: Diagnosis not present

## 2022-01-14 DIAGNOSIS — S83512A Sprain of anterior cruciate ligament of left knee, initial encounter: Secondary | ICD-10-CM | POA: Diagnosis not present

## 2022-01-14 DIAGNOSIS — M25562 Pain in left knee: Secondary | ICD-10-CM | POA: Diagnosis not present

## 2022-01-14 DIAGNOSIS — S82142A Displaced bicondylar fracture of left tibia, initial encounter for closed fracture: Secondary | ICD-10-CM | POA: Diagnosis not present

## 2022-01-14 DIAGNOSIS — S83511A Sprain of anterior cruciate ligament of right knee, initial encounter: Secondary | ICD-10-CM | POA: Diagnosis not present

## 2022-01-14 DIAGNOSIS — M94262 Chondromalacia, left knee: Secondary | ICD-10-CM | POA: Diagnosis not present

## 2022-01-14 HISTORY — PX: KNEE ARTHROSCOPY WITH ANTERIOR CRUCIATE LIGAMENT (ACL) REPAIR WITH HAMSTRING GRAFT: SHX5645

## 2022-01-14 LAB — POCT PREGNANCY, URINE: Preg Test, Ur: NEGATIVE

## 2022-01-14 SURGERY — KNEE ARTHROSCOPY WITH ANTERIOR CRUCIATE LIGAMENT (ACL) REPAIR WITH HAMSTRING GRAFT
Anesthesia: General | Site: Knee | Laterality: Left

## 2022-01-14 MED ORDER — MIDAZOLAM HCL 2 MG/2ML IJ SOLN
INTRAMUSCULAR | Status: AC
  Administered 2022-01-14: 2 mg via INTRAVENOUS
  Filled 2022-01-14: qty 2

## 2022-01-14 MED ORDER — DEXMEDETOMIDINE HCL IN NACL 200 MCG/50ML IV SOLN
INTRAVENOUS | Status: DC | PRN
  Administered 2022-01-14: 20 ug via INTRAVENOUS

## 2022-01-14 MED ORDER — ACETAMINOPHEN 500 MG PO TABS: 1000.0000 mg | ORAL_TABLET | Freq: Three times a day (TID) | ORAL | 2 refills | Status: AC

## 2022-01-14 MED ORDER — BUPIVACAINE HCL (PF) 0.5 % IJ SOLN
INTRAMUSCULAR | Status: AC
  Filled 2022-01-14: qty 20

## 2022-01-14 MED ORDER — HYDROMORPHONE HCL 1 MG/ML IJ SOLN
0.2500 mg | INTRAMUSCULAR | Status: DC | PRN
  Administered 2022-01-14 (×2): 0.5 mg via INTRAVENOUS

## 2022-01-14 MED ORDER — ASPIRIN EC 325 MG PO TBEC: 325.0000 mg | DELAYED_RELEASE_TABLET | Freq: Every day | ORAL | 0 refills | Status: AC

## 2022-01-14 MED ORDER — ACETAMINOPHEN 10 MG/ML IV SOLN: 1000.0000 mg | Freq: Once | INTRAVENOUS | Status: DC | PRN

## 2022-01-14 MED ORDER — OXYCODONE HCL 5 MG PO TABS: 5.0000 mg | ORAL_TABLET | Freq: Once | ORAL | Status: AC

## 2022-01-14 MED ORDER — MIDAZOLAM HCL 2 MG/2ML IJ SOLN: 1.0000 mg | Freq: Once | INTRAMUSCULAR | Status: DC | PRN

## 2022-01-14 MED ORDER — LIDOCAINE HCL (CARDIAC) PF 100 MG/5ML IV SOSY
PREFILLED_SYRINGE | INTRAVENOUS | Status: DC | PRN
  Administered 2022-01-14: 100 mg via INTRAVENOUS

## 2022-01-14 MED ORDER — PROPOFOL 10 MG/ML IV BOLUS
INTRAVENOUS | Status: DC | PRN
  Administered 2022-01-14: 200 mg via INTRAVENOUS
  Administered 2022-01-14: 50 mg via INTRAVENOUS

## 2022-01-14 MED ORDER — DEXMEDETOMIDINE HCL IN NACL 80 MCG/20ML IV SOLN
INTRAVENOUS | Status: AC
  Filled 2022-01-14: qty 20

## 2022-01-14 MED ORDER — SEVOFLURANE IN SOLN
RESPIRATORY_TRACT | Status: AC
  Filled 2022-01-14: qty 250

## 2022-01-14 MED ORDER — LIDOCAINE HCL (PF) 1 % IJ SOLN
INTRAMUSCULAR | Status: AC
Start: 2022-01-14 — End: 2022-01-14
  Filled 2022-01-14: qty 2

## 2022-01-14 MED ORDER — FAMOTIDINE 20 MG PO TABS
ORAL_TABLET | ORAL | Status: AC
  Filled 2022-01-14: qty 1

## 2022-01-14 MED ORDER — VANCOMYCIN HCL 1000 MG IV SOLR
INTRAVENOUS | Status: DC | PRN
  Administered 2022-01-14: 1000 mg

## 2022-01-14 MED ORDER — DIAZEPAM 5 MG PO TABS: 5.0000 mg | ORAL_TABLET | Freq: Three times a day (TID) | ORAL | 0 refills | Status: AC | PRN

## 2022-01-14 MED ORDER — PROPOFOL 1000 MG/100ML IV EMUL
INTRAVENOUS | Status: AC
  Filled 2022-01-14: qty 100

## 2022-01-14 MED ORDER — LIDOCAINE-EPINEPHRINE 1 %-1:100000 IJ SOLN
INTRAMUSCULAR | Status: AC
  Filled 2022-01-14: qty 1

## 2022-01-14 MED ORDER — MIDAZOLAM HCL 2 MG/2ML IJ SOLN
INTRAMUSCULAR | Status: AC
  Filled 2022-01-14: qty 2

## 2022-01-14 MED ORDER — HYDROMORPHONE HCL 1 MG/ML IJ SOLN
INTRAMUSCULAR | Status: AC
  Administered 2022-01-14: 0.5 mg via INTRAVENOUS
  Filled 2022-01-14: qty 1

## 2022-01-14 MED ORDER — BUPIVACAINE HCL (PF) 0.5 % IJ SOLN
INTRAMUSCULAR | Status: DC | PRN
  Administered 2022-01-14 (×2): 10 mL via PERINEURAL

## 2022-01-14 MED ORDER — CLINDAMYCIN PHOSPHATE 900 MG/50ML IV SOLN
INTRAVENOUS | Status: AC
  Filled 2022-01-14: qty 50

## 2022-01-14 MED ORDER — FENTANYL CITRATE PF 50 MCG/ML IJ SOSY
PREFILLED_SYRINGE | INTRAMUSCULAR | Status: AC
  Filled 2022-01-14: qty 1

## 2022-01-14 MED ORDER — GABAPENTIN 300 MG PO CAPS: 300.0000 mg | ORAL_CAPSULE | Freq: Three times a day (TID) | ORAL | 0 refills | Status: AC

## 2022-01-14 MED ORDER — CELECOXIB 200 MG PO CAPS
ORAL_CAPSULE | ORAL | Status: AC
  Administered 2022-01-14: 200 mg via ORAL
  Filled 2022-01-14: qty 1

## 2022-01-14 MED ORDER — CHLORHEXIDINE GLUCONATE 0.12 % MT SOLN
OROMUCOSAL | Status: AC
  Administered 2022-01-14: 15 mL via OROMUCOSAL
  Filled 2022-01-14: qty 15

## 2022-01-14 MED ORDER — HYDROMORPHONE HCL 1 MG/ML IJ SOLN
INTRAMUSCULAR | Status: AC
  Filled 2022-01-14: qty 1

## 2022-01-14 MED ORDER — HYDROMORPHONE HCL 1 MG/ML IJ SOLN
INTRAMUSCULAR | Status: DC | PRN
Start: 2022-01-14 — End: 2022-01-14
  Administered 2022-01-14: 1 mg via INTRAVENOUS

## 2022-01-14 MED ORDER — ONDANSETRON HCL 4 MG/2ML IJ SOLN
INTRAMUSCULAR | Status: DC | PRN
  Administered 2022-01-14: 4 mg via INTRAVENOUS

## 2022-01-14 MED ORDER — LACTATED RINGERS IV SOLN
INTRAVENOUS | Status: DC | PRN
  Administered 2022-01-14: 3000 mL
  Administered 2022-01-14: 6002 mL

## 2022-01-14 MED ORDER — BUPIVACAINE HCL (PF) 0.5 % IJ SOLN
INTRAMUSCULAR | Status: AC
  Filled 2022-01-14: qty 30

## 2022-01-14 MED ORDER — DEXAMETHASONE SODIUM PHOSPHATE 10 MG/ML IJ SOLN
INTRAMUSCULAR | Status: DC | PRN
Start: 2022-01-14 — End: 2022-01-14
  Administered 2022-01-14: 10 mg via INTRAVENOUS

## 2022-01-14 MED ORDER — LIDOCAINE HCL (PF) 1 % IJ SOLN
INTRAMUSCULAR | Status: DC | PRN
  Administered 2022-01-14 (×2): 1 mL via SUBCUTANEOUS

## 2022-01-14 MED ORDER — PHENYLEPHRINE HCL-NACL 20-0.9 MG/250ML-% IV SOLN
INTRAVENOUS | Status: AC
  Filled 2022-01-14: qty 250

## 2022-01-14 MED ORDER — OXYCODONE HCL 5 MG PO TABS
ORAL_TABLET | ORAL | Status: AC
  Administered 2022-01-14: 5 mg via ORAL
  Filled 2022-01-14: qty 1

## 2022-01-14 MED ORDER — HYDROMORPHONE HCL 1 MG/ML IJ SOLN
INTRAMUSCULAR | Status: AC
Start: 2022-01-14 — End: 2022-01-14
  Administered 2022-01-14: 0.5 mg via INTRAVENOUS
  Filled 2022-01-14: qty 1

## 2022-01-14 MED ORDER — GABAPENTIN 300 MG PO CAPS
ORAL_CAPSULE | ORAL | Status: AC
  Administered 2022-01-14: 300 mg via ORAL
  Filled 2022-01-14: qty 1

## 2022-01-14 MED ORDER — PROMETHAZINE HCL 25 MG/ML IJ SOLN: 6.2500 mg | INTRAMUSCULAR | Status: DC | PRN

## 2022-01-14 MED ORDER — SUCCINYLCHOLINE CHLORIDE 200 MG/10ML IV SOSY
PREFILLED_SYRINGE | INTRAVENOUS | Status: DC | PRN
  Administered 2022-01-14: 100 mg via INTRAVENOUS

## 2022-01-14 MED ORDER — VANCOMYCIN HCL 1000 MG IV SOLR
INTRAVENOUS | Status: AC
  Filled 2022-01-14: qty 40

## 2022-01-14 MED ORDER — FENTANYL CITRATE (PF) 100 MCG/2ML IJ SOLN
INTRAMUSCULAR | Status: AC
Start: 2022-01-14 — End: ?
  Filled 2022-01-14: qty 2

## 2022-01-14 MED ORDER — EPINEPHRINE PF 1 MG/ML IJ SOLN
INTRAMUSCULAR | Status: AC
  Filled 2022-01-14: qty 4

## 2022-01-14 MED ORDER — FENTANYL CITRATE (PF) 100 MCG/2ML IJ SOLN
INTRAMUSCULAR | Status: DC | PRN
  Administered 2022-01-14 (×2): 50 ug via INTRAVENOUS

## 2022-01-14 MED ORDER — ACETAMINOPHEN 500 MG PO TABS
ORAL_TABLET | ORAL | Status: AC
  Administered 2022-01-14: 1000 mg via ORAL
  Filled 2022-01-14: qty 2

## 2022-01-14 MED ORDER — FAMOTIDINE 20 MG PO TABS: 20.0000 mg | ORAL_TABLET | Freq: Once | ORAL | Status: DC

## 2022-01-14 MED ORDER — OXYCODONE HCL 5 MG PO TABS: 5.0000 mg | ORAL_TABLET | ORAL | 0 refills | Status: AC | PRN

## 2022-01-14 MED ORDER — RINGERS IRRIGATION IR SOLN
Status: DC | PRN
Start: 2022-01-14 — End: 2022-01-14
  Administered 2022-01-14: 3000 mL

## 2022-01-14 MED ORDER — LACTATED RINGERS IV SOLN: INTRAVENOUS | Status: DC | PRN

## 2022-01-14 MED ORDER — MIDAZOLAM HCL 2 MG/2ML IJ SOLN
INTRAMUSCULAR | Status: DC | PRN
  Administered 2022-01-14 (×2): 2 mg via INTRAVENOUS

## 2022-01-14 MED ORDER — ONDANSETRON 4 MG PO TBDP: 4.0000 mg | ORAL_TABLET | Freq: Three times a day (TID) | ORAL | 0 refills | Status: AC | PRN

## 2022-01-14 MED ORDER — IBUPROFEN 800 MG PO TABS: 800.0000 mg | ORAL_TABLET | Freq: Three times a day (TID) | ORAL | 1 refills | Status: AC

## 2022-01-14 MED ORDER — MIDAZOLAM HCL 2 MG/2ML IJ SOLN: 2.0000 mg | Freq: Once | INTRAMUSCULAR | Status: AC

## 2022-01-14 SURGICAL SUPPLY — 96 items
ADAPTER IRRIG TUBE 2 SPIKE SOL (ADAPTER) ×6 IMPLANT
ADH SKN CLS APL DERMABOND .7 (GAUZE/BANDAGES/DRESSINGS) ×1
ADPR TBG 2 SPK PMP STRL ASCP (ADAPTER) ×2
ANCH SUT SWLK 19.1X4.75 (Anchor) ×1 IMPLANT
ANCHOR BUTTON TIGHTROPE RN 14 (Anchor) ×1 IMPLANT
ANCHOR SUT BIO SW 4.75X19.1 (Anchor) ×1 IMPLANT
APL PRP STRL LF DISP 70% ISPRP (MISCELLANEOUS) ×2
BASIN GRAD PLASTIC 32OZ STRL (MISCELLANEOUS) ×3 IMPLANT
BLADE SHAVER 4.5X7 STR FR (MISCELLANEOUS) ×3 IMPLANT
BLADE SURG 15 STRL LF DISP TIS (BLADE) ×4 IMPLANT
BLADE SURG 15 STRL SS (BLADE) ×6
BLADE SURG SZ10 CARB STEEL (BLADE) ×3 IMPLANT
BLADE SURG SZ11 CARB STEEL (BLADE) ×3 IMPLANT
BNDG COHESIVE 4X5 TAN ST LF (GAUZE/BANDAGES/DRESSINGS) ×3 IMPLANT
BNDG COHESIVE 6X5 TAN ST LF (GAUZE/BANDAGES/DRESSINGS) ×3 IMPLANT
BNDG ESMARK 6X12 TAN STRL LF (GAUZE/BANDAGES/DRESSINGS) ×3 IMPLANT
BRUSH SCRUB EZ  4% CHG (MISCELLANEOUS)
BRUSH SCRUB EZ 4% CHG (MISCELLANEOUS) ×2 IMPLANT
BUR BR 5.5 WIDE MOUTH (BURR) IMPLANT
CHLORAPREP W/TINT 26 (MISCELLANEOUS) ×6 IMPLANT
CLEANER CAUTERY TIP 5X5 PAD (MISCELLANEOUS) ×2 IMPLANT
COOLER POLAR GLACIER W/PUMP (MISCELLANEOUS) ×3 IMPLANT
COVER BACK TABLE REUSABLE LG (DRAPES) ×3 IMPLANT
CUFF TOURN SGL QUICK 24 (TOURNIQUET CUFF)
CUFF TOURN SGL QUICK 34 (TOURNIQUET CUFF)
CUFF TRNQT CYL 24X4X16.5-23 (TOURNIQUET CUFF) IMPLANT
CUFF TRNQT CYL 34X4.125X (TOURNIQUET CUFF) IMPLANT
DERMABOND ADVANCED (GAUZE/BANDAGES/DRESSINGS) ×1
DERMABOND ADVANCED .7 DNX12 (GAUZE/BANDAGES/DRESSINGS) IMPLANT
DRAPE 3/4 80X56 (DRAPES) ×6 IMPLANT
DRAPE ARTHRO LIMB 89X125 STRL (DRAPES) ×6 IMPLANT
DRAPE FLUOR MINI C-ARM 54X84 (DRAPES) ×3 IMPLANT
DRAPE IMP U-DRAPE 54X76 (DRAPES) ×3 IMPLANT
DRAPE ORTHO SPLIT 77X108 STRL (DRAPES) ×2
DRAPE POUCH INSTRU U-SHP 10X18 (DRAPES) ×3 IMPLANT
DRAPE SURG ORHT 6 SPLT 77X108 (DRAPES) ×2 IMPLANT
DRILL FLIPCUTTER III 6-12 (ORTHOPEDIC DISPOSABLE SUPPLIES) IMPLANT
ELECT REM PT RETURN 9FT ADLT (ELECTROSURGICAL) ×2
ELECTRODE REM PT RTRN 9FT ADLT (ELECTROSURGICAL) ×2 IMPLANT
FLIPCUTTER III 6-12 AR-1204FF (ORTHOPEDIC DISPOSABLE SUPPLIES) ×2
GAUZE SPONGE 4X4 12PLY STRL (GAUZE/BANDAGES/DRESSINGS) ×3 IMPLANT
GAUZE XEROFORM 1X8 LF (GAUZE/BANDAGES/DRESSINGS) ×3 IMPLANT
GLOVE SRG 8 PF TXTR STRL LF DI (GLOVE) ×2 IMPLANT
GLOVE SURG SYN 8.0 (GLOVE) ×2 IMPLANT
GLOVE SURG SYN 8.0 PF PI (GLOVE) ×2 IMPLANT
GLOVE SURG UNDER POLY LF SZ8 (GLOVE) ×2
GOWN STRL REUS W/ TWL LRG LVL3 (GOWN DISPOSABLE) ×2 IMPLANT
GOWN STRL REUS W/ TWL XL LVL3 (GOWN DISPOSABLE) ×2 IMPLANT
GOWN STRL REUS W/TWL LRG LVL3 (GOWN DISPOSABLE) ×2
GOWN STRL REUS W/TWL XL LVL3 (GOWN DISPOSABLE) ×2
GRADUATE 1200CC STRL 31836 (MISCELLANEOUS) ×3 IMPLANT
GUIDEWIRE 1.2MMX18 (WIRE) ×3 IMPLANT
HANDLE YANKAUER SUCT BULB TIP (MISCELLANEOUS) ×3 IMPLANT
IMP SYS 2ND FIX PEEK 4.75X19.1 (Miscellaneous) IMPLANT
IMPL SYS 2ND FX PEEK 4.75X19.1 (Miscellaneous) IMPLANT
IMPL TIGHTROP ABS ACL FIBERTG (Orthopedic Implant) IMPLANT
IMPL TIGHTROP FIBERTAG ACL (Orthopedic Implant) IMPLANT
IMPL TIGHTROPE ABS ACL FIBERTG (Orthopedic Implant) ×2 IMPLANT
IMPLANT TIGHTROPE FIBERTAG ACL (Orthopedic Implant) ×2 IMPLANT
IV LACTATED RINGER IRRG 3000ML (IV SOLUTION) ×12
IV LR IRRIG 3000ML ARTHROMATIC (IV SOLUTION) ×12 IMPLANT
KIT TRANSTIBIAL (DISPOSABLE) ×1 IMPLANT
KIT TURNOVER KIT A (KITS) ×3 IMPLANT
MANIFOLD NEPTUNE II (INSTRUMENTS) ×6 IMPLANT
MAT ABSORB  FLUID 56X50 GRAY (MISCELLANEOUS) ×4
MAT ABSORB FLUID 56X50 GRAY (MISCELLANEOUS) ×4 IMPLANT
NEEDLE HYPO 22GX1.5 SAFETY (NEEDLE) ×3 IMPLANT
PACK ARTHROSCOPY KNEE (MISCELLANEOUS) ×3 IMPLANT
PAD ABD DERMACEA PRESS 5X9 (GAUZE/BANDAGES/DRESSINGS) ×6 IMPLANT
PAD CLEANER CAUTERY TIP 5X5 (MISCELLANEOUS) ×1
PAD WRAPON POLAR KNEE (MISCELLANEOUS) ×2 IMPLANT
PENCIL ELECTRO HAND CTR (MISCELLANEOUS) ×3 IMPLANT
PENCIL SMOKE EVACUATOR (MISCELLANEOUS) ×3 IMPLANT
SHAVER BLADE BONE CUTTER  5.5 (BLADE)
SHAVER BLADE BONE CUTTER 5.5 (BLADE) IMPLANT
SPONGE T-LAP 18X18 ~~LOC~~+RFID (SPONGE) ×9 IMPLANT
STRIP CLOSURE SKIN 1/2X4 (GAUZE/BANDAGES/DRESSINGS) ×3 IMPLANT
SUCTION FRAZIER HANDLE 10FR (MISCELLANEOUS)
SUCTION TUBE FRAZIER 10FR DISP (MISCELLANEOUS) IMPLANT
SUT ETHILON 3-0 FS-10 30 BLK (SUTURE) ×2
SUT FIBERSNARE 2 CLSD LOOP (SUTURE) ×1 IMPLANT
SUT FIBERWIRE #2 38 T-5 BLUE (SUTURE) ×4
SUT MNCRL AB 4-0 PS2 18 (SUTURE) ×4 IMPLANT
SUT VIC AB 0 CT1 36 (SUTURE) ×3 IMPLANT
SUT VIC AB 2-0 CT1 27 (SUTURE) ×2
SUT VIC AB 2-0 CT1 TAPERPNT 27 (SUTURE) ×2 IMPLANT
SUT VIC AB 2-0 CT2 27 (SUTURE) ×2 IMPLANT
SUTURE EHLN 3-0 FS-10 30 BLK (SUTURE) ×2 IMPLANT
SUTURE FIBERWR #2 38 T-5 BLUE (SUTURE) ×4 IMPLANT
SYR BULB IRRIG 60ML STRL (SYRINGE) ×3 IMPLANT
TRAY FOLEY SLVR 16FR LF STAT (SET/KITS/TRAYS/PACK) ×2 IMPLANT
TUBING INFLOW SET DBFLO PUMP (TUBING) ×3 IMPLANT
TUBING OUTFLOW SET DBLFO PUMP (TUBING) ×3 IMPLANT
WAND WEREWOLF FLOW 90D (MISCELLANEOUS) ×1 IMPLANT
WATER STERILE IRR 500ML POUR (IV SOLUTION) ×3 IMPLANT
WRAPON POLAR PAD KNEE (MISCELLANEOUS) ×2

## 2022-01-14 NOTE — Anesthesia Procedure Notes (Signed)
Procedure Name: Intubation ?Date/Time: 01/14/2022 7:37 AM ?Performed by: Philbert Riser, CRNA ?Pre-anesthesia Checklist: Patient identified, Patient being monitored, Timeout performed, Emergency Drugs available and Suction available ?Patient Re-evaluated:Patient Re-evaluated prior to induction ?Oxygen Delivery Method: Circle system utilized ?Preoxygenation: Pre-oxygenation with 100% oxygen ?Induction Type: IV induction ?Ventilation: Mask ventilation without difficulty ?Laryngoscope Size: 3 and McGraph ?Grade View: Grade I ?Tube type: Oral ?Tube size: 7.0 mm ?Number of attempts: 1 ?Airway Equipment and Method: Stylet ?Placement Confirmation: ETT inserted through vocal cords under direct vision, positive ETCO2 and breath sounds checked- equal and bilateral ?Secured at: 21 cm ?Tube secured with: Tape ?Dental Injury: Teeth and Oropharynx as per pre-operative assessment  ? ? ? ? ?

## 2022-01-14 NOTE — H&P (Signed)
Paper H&P to be scanned into permanent record. H&P reviewed. No significant changes noted.  

## 2022-01-14 NOTE — Anesthesia Procedure Notes (Signed)
Anesthesia Regional Block: Other (iPACK)  ? ?Pre-Anesthetic Checklist: , timeout performed,  Correct Patient, Correct Site, Correct Laterality,  Correct Procedure, Correct Position, site marked,  Risks and benefits discussed,  Surgical consent,  Pre-op evaluation,  At surgeon's request and post-op pain management ? ?Laterality: Left ? ?Prep: chloraprep     ?  ?Needles:  ?Injection technique: Single-shot ? ?Needle Type: Stimiplex   ? ? ?Needle Length: 9cm  ?Needle Gauge: 22  ? ? ? ?Additional Needles: ? ? ?Procedures:,,,, ultrasound used (permanent image in chart),,    ?Narrative:  ?Start time: 01/14/2022 7:17 AM ?End time: 01/14/2022 7:20 AM ?Injection made incrementally with aspirations every 20 mL. ? ?Performed by: Personally  ?Anesthesiologist: Foye Deer, MD ? ?Additional Notes: ?Patient consented for risk and benefits of nerve block including but not limited to nerve damage, failed block, bleeding and infection.  Patient voiced understanding. ? ?Functioning IV was confirmed and monitors were applied.  Timeout done prior to procedure and prior to any sedation being given to the patient.  Patient confirmed procedure site prior to any sedation given to the patient.  A 54mm 22ga Stimuplex needle was used. Sterile prep,hand hygiene and sterile gloves were used.  Minimal sedation used for procedure.  No paresthesia endorsed by patient during the procedure.  Negative aspiration and negative test dose prior to incremental administration of local anesthetic. The patient tolerated the procedure well with no immediate complications. ? ? ? ?

## 2022-01-14 NOTE — Transfer of Care (Signed)
Immediate Anesthesia Transfer of Care Note ? ?Patient: Taylor [name] ? ?Procedure(s) Performed: Left arthroscopic ACL reconstruction usig quadriceps tendon autograft, possible meniscus repair, possible chondroplasty (Left: Knee) ? ?Patient Location: PACU ? ?Anesthesia Type:General ? ?Level of Consciousness: drowsy ? ?Airway & Oxygen Therapy: Patient Spontanous Breathing and Patient connected to face mask oxygen ? ?Post-op Assessment: Report given to RN and Post -op Vital signs reviewed and stable ? ?Post vital signs: Reviewed and stable ? ?Last Vitals:  ?Vitals Value Taken Time  ?BP    ?Temp    ?Pulse    ?Resp    ?SpO2    ? ? ?Last Pain:  ?Vitals:  ? 01/14/22 0619  ?TempSrc: Temporal  ?PainSc: 4   ?   ? ?  ? ?Complications: No notable events documented. ?

## 2022-01-14 NOTE — Discharge Instructions (Addendum)
Arthroscopic ACL Surgery ?  ?Post-Op Instructions ?  ?1. Bracing or crutches: Crutches will be provided at the time of discharge from the surgery center.  ?  ?2. Ice: You may be provided with a device [facility]) that allows you to ice the affected area effectively. Otherwise you can ice manually.  ? ?3. Driving:  Driving: Off all narcotic pain meds when operating vehicle  ? 1 week for automatic cars, left leg surgery ? 2-4 weeks for standard/manual cars or right leg surgery ?  ?4. Activity: Ankle pumps several times an hour while awake to prevent blood clots. Weight bearing: full weight is permitted with brace locked in extension for at least 1-2 weeks. Brace can be unlocked when able to do straight leg raise without difficulty. Use crutches if there is pain and limping. Bending and straightening the knee is unlimited. Elevate knee above heart level as much as possible for one week. Avoid standing more than 5 minutes (consecutively) for the first week. No exercise involving the knee until cleared by the surgeon or physical therapist. Ideally, you should avoid long distance travel for 4 weeks. ?  ?5. Medications:  ?- You have been provided a prescription for narcotic pain medicine (oxycodone). After surgery, take 1-2 narcotic tablets every 4 hours if needed for severe pain.  ?- A prescription for anti-nausea medication (Zofran) will be provided in case the narcotic medicine causes nausea - take 1 tablet every 6 hours only if nauseated.  ?- Take ibuprofen 800 mg every 8 hours with food to reduce post-operative knee swelling. DO NOT STOP IBUPROFEN POST-OP UNTIL INSTRUCTED TO DO SO at first post-op office visit (10-14 days after surgery).  ?- Take enteric coated aspirin 325 mg once daily for 2 weeks to prevent blood clots.  ?-Take tylenol 1000 mg every 8 hours for pain.  May stop tylenol ~1-2 weeks after surgery if you are having minimal pain. ?- Take gabapentin 300 mg three times daily for 5 days ?- Take Valium 5mg   three times daily x 2 weeks. May stop if not having any significant muscle spasms. ? ?  ?If you are taking prescription medication for anxiety, depression, insomnia, muscle spasm, chronic pain, or for attention deficit disorder you are advised that you are at a higher risk of adverse effects with use of narcotics post-op, including narcotic addiction/dependence, depressed breathing, death. ?If you use non-prescribed substances: alcohol, marijuana, cocaine, heroin, methamphetamines, etc., you are at a higher risk of adverse effects with use of narcotics post-op, including narcotic addiction/dependence, depressed breathing, death. ?You are advised that taking > 50 morphine milligram equivalents (MME) of narcotic pain medication per day results in twice the risk of overdose or death. For your prescription provided: oxycodone 5 mg - taking more than 6 tablets per day. Be advised that we will prescribe narcotics short-term, for acute post-operative pain only - 1 week for minor operations such as knee arthroscopy for meniscus tear resection, and 3 weeks for major operations such as knee repair/reconstruction surgeries.  ? ?6. Bandages: The physical therapist should change the bandages at the first post-op appointment. If needed, the dressing supplies have been provided to you. ?  ?7. Physical Therapy: 2 times per week for the first 4 weeks, then 1-2 times per week from weeks 4-8 post-op. Therapy typically starts within 1 week. You have been provided an order for physical therapy. The therapist will provide home exercises. Attending physical therapy and performing the appropriate exercises on your own at home daily  will determine how well you do after this surgery. If you do not know when your first physical therapy appointment is, please call the office at [phone]. ?  ?8. Work/School: May return when able to tolerate standing for greater than 2 hours and off of narcotic pain medicaitons ?  ?9. Post-Op  Appointments: ?Your first post-op appointment will be with Dr. Allena Katz in approximately 2 weeks time.  ?  ?If you find that they have not been scheduled please call the Orthopaedic Appointment front desk at [phone]. ? ? ?AMBULATORY SURGERY  ?DISCHARGE INSTRUCTIONS ? ? ?The drugs that you were given will stay in your system until tomorrow so for the next 24 hours you should not: ? ?Drive an automobile ?Make any legal decisions ?Drink any alcoholic beverage ? ? ?You may resume regular meals tomorrow.  Today it is better to start with liquids and gradually work up to solid foods. ? ?You may eat anything you prefer, but it is better to start with liquids, then soup and crackers, and gradually work up to solid foods. ? ? ?Please notify your doctor immediately if you have any unusual bleeding, trouble breathing, redness and pain at the surgery site, drainage, fever, or pain not relieved by medication. ? ? ? ?Additional Instructions: ? ? ? ? ? ? ? ?Please contact your physician with any problems or Same Day Surgery at [phone], Monday through Friday 6 am to 4 pm, or [place] at [facility] number at [phone].  ?

## 2022-01-14 NOTE — Progress Notes (Signed)
Orthopedic Tech Progress Note ?Patient Details:  ?Taylor [name] ?1986-03-09 ?[id number] ? ?[facility] called needing more information about patient so he can bring over the correct brace ? ? Patient ID: [name], female   DOB: 03/20/1986, 36 y.o.   MRN: [id number] ? ?Janit Pagan ?01/14/2022, 8:17 AM ? ?

## 2022-01-14 NOTE — Op Note (Signed)
?Operative Note  ?  ?SURGERY DATE: 01/14/2022 ?  ?PRE-OP DIAGNOSIS:  ?1. Left knee anterior cruciate ligament tear ?  ?POST-OP DIAGNOSIS:  ?1. Left knee anterior cruciate ligament tear ?2. Left knee mild degenerative changes to lateral compartment ? ?PROCEDURES:  ?1. Left knee anterior cruciate ligament reconstruction with quadriceps tendon autograft ?2. Left knee chondroplasty of the lateral compartment ?  ?SURGEON: Rosealee AlbeeSunny H. Kellan Boehlke, MD ? ?ASSISTANT: Dedra Skeensodd Mundy, PA; Jon GillsWesley Yeung, PA-S  ?  ?ANESTHESIA: Gen + regional ?  ?ESTIMATED BLOOD LOSS: 5cc ?  ?TOTAL IV FLUIDS: per anesthesia ? ?INDICATION(S):  Taylor [name] is a 36 y.o. female who initially suffered a left knee injury while tripping over her dog at home in August 2022.  There was also concern for possible extra-articular lateral tibial plateau fracture.  She was treated nonoperatively on this side, but also sustained a right ACL reconstruction after she was attempting to leave the hospital for evaluation of her left knee.  The right side was more symptomatic and she underwent right ACL reconstruction with medial meniscus repair by me on 09/24/2021.  She did well postoperatively from the right knee surgery and the left knee became more symptomatic.  MRI and clinical exam were consistent with left ACL tear. We discussed risks of surgery including but not limited to possible ACL re-tear, infection, bleeding, muscle/nerve damage, DVT, complications of anesthesia, and postoperative knee pain and arthrofibrosis. After discussion of risks, benefits, and alternatives to surgery, the patient elected to proceed.  After discussion of risks, benefits, and alternatives to surgery, the patient elected to proceed.  ? ?  ?OPERATIVE FINDINGS:  ?  ?Examination under anesthesia: A careful examination under anesthesia was performed.  Passive range of motion was: Hyperextension: 2.  Extension: 0.  Flexion: 120.  Lachman: 2B. Pivot Shift: grade 2.  Posterior drawer: normal.   Varus stability in full extension: normal.  Varus stability in 30 degrees of flexion: normal.  Valgus stability in full extension: normal.  Valgus stability in 30 degrees of flexion: normal. ?  ?Intra-operative findings: A thorough arthroscopic examination of the knee was performed.  The findings are: ?1. Suprapatellar pouch: Normal ?2. Undersurface of median ridge: Grade 1 degenerative changes ?3. Medial patellar facet: Grade 1 degenerative changes ?4. Lateral patellar facet: Grade 1 degenerative changes ?5. Trochlea: Grade 1 degenerative changes ?6. Lateral gutter/popliteus tendon: Normal ?7. Hoffa's fat pad: Normal ?8. Medial gutter/plica: Normal ?9. ACL: Abnormal: complete femoral avulsion ?10. PCL: Normal ?11. Medial meniscus: Normal ?12. Medial compartment cartilage: Normal ?13. Lateral meniscus: Normal ?14. Lateral compartment cartilage: Grade 2 degenerative changes to the tibial plateau anterior to the posterior horn meniscus root and just posterior to the anterior horn; normal lateral femoral condyle ?  ?OPERATIVE REPORT:   ? I identified Taylor [name] in the pre-operative holding area.  I marked the operative knee with my initials. I reviewed the risks and benefits of the proposed surgical intervention and the patient (and/or patient's guardian) wished to proceed.  Anesthesia was then performed with regional nerve block.  The patient was transferred to the operative suite and placed in the supine position with all bony prominences padded.  Care was taken to ensure that the contralateral leg was placed in neutral position and that the operative leg was well-padded in the leg holder.   ?  ?Appropriate IV antibiotics were administered within 30 minutes of incision. The extremity was then prepped and draped in standard fashion. A time out was performed confirming the correct  extremity, correct patient and correct procedure. ?  ?Given the clear presence of a pivot shift on examination under anesthesia, I  first directed my attention to the harvest of a quadriceps autograft.  The right lower extremity was exsanguinated with an Esmarch, and a thigh tourniquet was elevated.  The tourniquet was eventually let down after skin closure.  The total tourniquet time for this case was 120 minutes.  ?  ?A 5cm incision was planned just proximal to the proximal pole of the patella.  The incision was made with a 15 blade, and subcutaneous fat was sharply excised to expose the quadriceps tendon.  The paratenon was sharply incised, and the space in between the paratenon and the quadriceps tendon was bluntly developed with a sponge and a key elevator.  A speculum retractor was placed anteriorly, and the quadriceps was easily visualized with the arthroscope.  The vastus lateralis and VMO were clearly identified, as was the junction of the rectus femoris muscle with the proximal aspect of the quadriceps tendon.  Under direct visualization with the arthroscope, an Arthrex 10 mm parallel blade was used to incise the quadriceps tendon from its most proximal extent, to the junction with the patella.  Care was taken not to violate the rectus femoris muscle.  Then, using a 15 blade, the graft was transected and elevated distally off the patella, creating a 7 mm thick partial thickness graft.  Dissection was carried proximally to create a uniformly thick graft, 65 mm in length.  The distal end of the graft was controlled with a #2 Fiberwire stitch, and the Arthrex quadriceps harvester/cutter was loaded over the graft.  At a length of 65 mm, the harvest/cutter was used to transect the graft proximally, and the graft was removed from the wound.  There were a few areas of elevation of the anterior knee capsule, and these were sutured with 0 Vicryl. ?  ?On the back table, the graft was prepared in standard fashion.  The length of the graft was 65 mm.  Each end was prepared using an Warden/ranger.  The femoral end was secured around a  TightRope RT, and the tibial end was secured around an ABS loop.  A FiberTape was also loaded through the Endobutton to serve as an internal brace.  The femoral end of the graft was 9.70mm in diameter, the tibial end was 9.8mm in diameter.  The graft was tensioned to 20 lbs and reserved for later use.  The graft was placed in a graft tube and tensioned to 20 lbs and reserved for later use.  Of note, it was soaked in 5 mg/mL vancomycin solution to reduce risk of infection for at least 20 minutes prior to implantation. ?  ?Standard anterolateral portals was created with an 11-blade.  The arthroscope was introduced through the anterolateral portal, and a full diagnostic arthroscopy was performed as described above.   An anteromedial portal was made under needle localization. A shaver was introduced through the anteromedial portal and used to gently debride the fat pad to improve visualization. Then the ACL remnant was debrided using the shaver, leaving 1-2 mm stumps on the tibia for anatomic referencing. ?  ?Next, I created the femoral socket. This was performed with an outside-in technique using an Teacher, English as a foreign language. The retrograde femoral guide was utilized. We made the lateral stab incision with a 15 blade and followed the angle of the drill sleeve to make the incision through the IT band down to bone. The drill  sleeve was pushed down to bone on the lateral femoral condyle and the guide was placed on the anatomic footprint of the ACL. A 9.71mm tunnel 64mm in length was drilled. We then used a FiberStick to pass a suture through the femoral tunnel and out of the anteromedial portal.  ?  ?I then directed my attention to preparation of the tibial tunnel. A tibial guide set at 55 degrees was inserted through the anteromedial portal and centered over the tibial footprint.  The drill sleeve was then advanced to the proximal medial tibia just at the junction of the tibial tubercle and the pes tendon, through a ~5cm  incision.  The anticipated tunnel length was 76mm.  A guide pin was then drilled through the proximal tibia under direct arthroscopic visualization into the center of the ACL footprint.  This was then over-reamed

## 2022-01-14 NOTE — Anesthesia Procedure Notes (Signed)
Anesthesia Regional Block: Adductor canal block  ? ?Pre-Anesthetic Checklist: , timeout performed,  Correct Patient, Correct Site, Correct Laterality,  Correct Procedure, Correct Position, site marked,  Risks and benefits discussed,  Surgical consent,  Pre-op evaluation,  At surgeon's request and post-op pain management ? ?Laterality: Left ? ?Prep: chloraprep     ?  ?Needles:  ?Injection technique: Single-shot ? ?Needle Type: Stimiplex   ? ? ?Needle Length: 9cm  ?Needle Gauge: 22  ? ? ? ?Additional Needles: ? ? ?Procedures:,,,, ultrasound used (permanent image in chart),,    ?Narrative:  ?Start time: 01/14/2022 8:10 AM ?End time: 01/14/2022 8:10 AM ?Injection made incrementally with aspirations every 20 mL. ? ?Performed by: Personally  ?Anesthesiologist: Iran Ouch, MD ? ?Additional Notes: ?Patient consented for risk and benefits of nerve block including but not limited to nerve damage, failed block, bleeding and infection.  Patient voiced understanding. ? ?Functioning IV was confirmed and monitors were applied.  Timeout done prior to procedure and prior to any sedation being given to the patient.  Patient confirmed procedure site prior to any sedation given to the patient.  A 40mm 22ga Stimuplex needle was used. Sterile prep,hand hygiene and sterile gloves were used.  Minimal sedation used for procedure.  No paresthesia endorsed by patient during the procedure.  Negative aspiration and negative test dose prior to incremental administration of local anesthetic. The patient tolerated the procedure well with no immediate complications. ? ? ? ?

## 2022-01-14 NOTE — Evaluation (Signed)
Physical Therapy Evaluation ?Patient Details ?Name: Taylor [name] ?MRN: [id number] ?DOB: [birth year] ?Today's Date: 01/14/2022 ? ?History of Present Illness ? Pt is 36 y.o. female s/p L LE ACL reconstruction and chondroplasty of the lateral compartment. PmHx: Anxiety, HTN, Depression, GERD, Hypothyroid. ?  ?Clinical Impression ? Pt sitting in recliner w/ husband at bedside upon PT entrance into room today. Pt is A&Ox4 and denies any c/o pain at rest; willing to work w/ PT today. Prior to surgery, Pt was becoming more independent w/ ADLs following previous R ACL reconstruction; states prior to last surgery she was independent w/ all ADLs. Pt and husband live in 1-story home w/ ramp entrance. ? ?Pt is able to perform sit to stand w/ CGA using RW; education was provided about maintaining precautions with transfers and pt expressed understanding. Once standing, she is able to progress to ambulating ~57ft w/ CGA using RW and is able to alternate between bilateral UE and single UE support while conversing w/ husband and myself in hallway. Pt returned to recliner w/ all needs w/in reach prior to PT exiting room today. Pt will benefit from continued skilled PT in order to increase LE strength/endurance, improve mobility/gait, and restore PLOF. Current discharge recommendation to Outpatient PT is appropriate due to the level of assistance required by the patient to ensure safety and improve overall function. ?  ?   ? ?Recommendations for follow up therapy are one component of a multi-disciplinary discharge planning process, led by the attending physician.  Recommendations may be updated based on patient status, additional functional criteria and insurance authorization. ? ?Follow Up Recommendations Outpatient PT ? ?  ?Assistance Recommended at Discharge Intermittent Supervision/Assistance  ?Patient can return home with the following ? A little help with walking and/or transfers;A little help with  bathing/dressing/bathroom;Assistance with cooking/housework;Assist for transportation;Help with stairs or ramp for entrance ? ?  ?Equipment Recommendations None recommended by PT  ?Recommendations for Other Services ?    ?  ?Functional Status Assessment Patient has had a recent decline in their functional status and demonstrates the ability to make significant improvements in function in a reasonable and predictable amount of time.  ? ?  ?Precautions / Restrictions Precautions ?Precautions: Fall ?Required Braces or Orthoses: Knee Immobilizer - Left ?Knee Immobilizer - Left: On at all times ?Restrictions ?Weight Bearing Restrictions: Yes ?LLE Weight Bearing: Weight bearing as tolerated  ? ?  ? ?Mobility ? Bed Mobility ?  ?  ?  ?  ?  ?  ?  ?  ?  ? ?Transfers ?  ?  ?  ?  ?  ?  ?  ?  ?  ?General transfer comment: received in recliner ?  ? ?Ambulation/Gait ?Ambulation/Gait assistance: Min guard ?Gait Distance (Feet): 60 Feet ?Assistive device: Rolling walker (2 wheels) ?Gait Pattern/deviations: Decreased step length - right, Decreased step length - left, Decreased stride length, Step-to pattern ?Gait velocity: decreased ?  ?  ?  ? ?Stairs ?  ?  ?  ?  ?  ? ?Wheelchair Mobility ?  ? ?Modified Rankin (Stroke Patients Only) ?  ? ?  ? ?Balance Overall balance assessment: Needs assistance ?Sitting-balance support: Feet supported ?Sitting balance-Leahy Scale: Good ?  ?  ?Standing balance support: Bilateral upper extremity supported, Single extremity supported, During functional activity ?Standing balance-Leahy Scale: Good ?  ?  ?  ?  ?  ?  ?  ?  ?  ?  ?  ?  ?   ? ? ? ?Pertinent  Vitals/Pain Pain Assessment ?Pain Assessment: Faces ?Faces Pain Scale: Hurts little more ?Pain Location: L LE ?Pain Descriptors / Indicators: Aching, Sore, Dull, Grimacing ?Pain Intervention(s): Limited activity within patient's tolerance, Monitored during session  ? ? ?Home Living Family/patient expects to be discharged to:: Private residence ?Living  Arrangements: Spouse/significant other ?Available Help at Discharge: Family;Available 24 hours/day ?Type of Home: House ?Home Access: Ramped entrance ?  ?  ?  ?Home Layout: One level ?Home Equipment: Rolling Walker (2 wheels);BSC/3in1;Shower seat;Wheelchair - manual;Toilet riser ?   ?  ?Prior Function Prior Level of Function : Independent/Modified Independent ?  ?  ?  ?  ?  ?  ?  ?  ?  ? ? ?Hand Dominance  ?   ? ?  ?Extremity/Trunk Assessment  ? Upper Extremity Assessment ?Upper Extremity Assessment: Overall WFL for tasks assessed ?  ? ?Lower Extremity Assessment ?Lower Extremity Assessment: Overall WFL for tasks assessed;Generalized weakness ?  ? ?   ?Communication  ?    ?Cognition Arousal/Alertness: Awake/alert ?Behavior During Therapy: [facility] for tasks assessed/performed ?Overall Cognitive Status: Within Functional Limits for tasks assessed ?  ?  ?  ?  ?  ?  ?  ?  ?  ?  ?  ?  ?  ?  ?  ?  ?  ?  ?  ? ?  ?General Comments   ? ?  ?Exercises    ? ?Assessment/Plan  ?  ?PT Assessment Patient needs continued PT services  ?PT Problem List Decreased strength;Decreased mobility;Decreased range of motion;Decreased coordination;Decreased activity tolerance;Decreased balance;Pain ? ?   ?  ?PT Treatment Interventions DME instruction;Therapeutic exercise;Gait training;Balance training;Stair training;Neuromuscular re-education;Functional mobility training;Therapeutic activities;Patient/family education   ? ?PT Goals (Current goals can be found in the Care Plan section)  ?Acute Rehab PT Goals ?Patient Stated Goal: to go home ?PT Goal Formulation: With patient/family ?Time For Goal Achievement: 01/28/22 ?Potential to Achieve Goals: Good ? ?  ?Frequency Min 2X/week ?  ? ? ?Co-evaluation   ?  ?  ?  ?  ? ? ?  ?AM-PAC PT "6 Clicks" Mobility  ?Outcome Measure Help needed turning from your back to your side while in a flat bed without using bedrails?: A Little ?Help needed moving from lying on your back to sitting on the side of a flat bed  without using bedrails?: None ?Help needed moving to and from a bed to a chair (including a wheelchair)?: A Little ?Help needed standing up from a chair using your arms (e.g., wheelchair or bedside chair)?: A Little ?Help needed to walk in hospital room?: A Little ?Help needed climbing 3-5 steps with a railing? : A Lot ?6 Click Score: 18 ? ?  ?End of Session Equipment Utilized During Treatment: Gait belt ?Activity Tolerance: Patient tolerated treatment well;Patient limited by pain ?Patient left: in chair;with family/visitor present ?Nurse Communication: Mobility status ?PT Visit Diagnosis: Other abnormalities of gait and mobility (R26.89);Muscle weakness (generalized) (M62.81);Pain ?Pain - Right/Left: Left ?Pain - part of body: Knee ?  ? ?Time: 7619-5093 ?PT Time Calculation (min) (ACUTE ONLY): 18 min ? ? ?Charges:     ?  ?  ?   ? ?Jeralyn Bennett, SPT ?01/14/2022, 3:13 PM ? ?

## 2022-01-14 NOTE — Anesthesia Postprocedure Evaluation (Signed)
Anesthesia Post Note ? ?Patient: Taylor [name] ? ?Procedure(s) Performed: Left arthroscopic ACL reconstruction usig quadriceps tendon autograft, meniscus repair,chondroplasty (Left: Knee) ? ?Patient location during evaluation: PACU ?Anesthesia Type: General ?Level of consciousness: awake and alert ?Pain management: pain level controlled ?Vital Signs Assessment: post-procedure vital signs reviewed and stable ?Respiratory status: spontaneous breathing, nonlabored ventilation and respiratory function stable ?Cardiovascular status: blood pressure returned to baseline and stable ?Postop Assessment: no apparent nausea or vomiting ?Anesthetic complications: no ? ? ?No notable events documented. ? ? ?Last Vitals:  ?Vitals:  ? 01/14/22 1226 01/14/22 1248  ?BP: 129/86 (!) 168/91  ?Pulse: 60 77  ?Resp: 13 16  ?Temp: (!) 36.2 ?C (!) 36.1 ?C  ?SpO2: 95% 98%  ?  ?Last Pain:  ?Vitals:  ? 01/14/22 1248  ?TempSrc: Temporal  ?PainSc: 6   ? ? ?  ?  ?  ?  ?  ?  ? ?[place] Ouch ? ? ? ? ?

## 2022-01-15 ENCOUNTER — Encounter: Payer: Self-pay | Admitting: Orthopedic Surgery

## 2022-01-21 ENCOUNTER — Ambulatory Visit
Admission: RE | Admit: 2022-01-21 | Discharge: 2022-01-21 | Disposition: A | Discharge status: Home / Self Care | Payer: 59 | Source: Ambulatory Visit | Attending: Orthopedic Surgery | Admitting: Orthopedic Surgery

## 2022-01-21 ENCOUNTER — Other Ambulatory Visit: Payer: Self-pay | Admitting: Orthopedic Surgery

## 2022-01-21 DIAGNOSIS — M7989 Other specified soft tissue disorders: Secondary | ICD-10-CM | POA: Insufficient documentation

## 2022-01-21 DIAGNOSIS — M79662 Pain in left lower leg: Secondary | ICD-10-CM | POA: Diagnosis not present

## 2022-01-21 DIAGNOSIS — R2689 Other abnormalities of gait and mobility: Secondary | ICD-10-CM | POA: Diagnosis not present

## 2022-01-21 DIAGNOSIS — M79605 Pain in left leg: Secondary | ICD-10-CM | POA: Diagnosis not present

## 2022-01-21 DIAGNOSIS — M25662 Stiffness of left knee, not elsewhere classified: Secondary | ICD-10-CM | POA: Diagnosis not present

## 2022-01-21 DIAGNOSIS — M25562 Pain in left knee: Secondary | ICD-10-CM | POA: Diagnosis not present

## 2022-01-21 IMAGING — US US EXTREM LOW VENOUS*L*
1 series · 14 of 24 positions shown · non-contrast
Comparison: None.

CLINICAL DATA: Left lower extremity pain and swelling.

EXAM:
Left LOWER EXTREMITY VENOUS DOPPLER ULTRASOUND
TECHNIQUE: Gray-scale sonography with compression, as well as color and duplex
ultrasound, were performed to evaluate the deep venous system(s)
from the level of the common femoral vein through the popliteal and
proximal calf veins.

[Series 1: us extrem low venous*left* · 0.07mm/px · 14 of 31 slices shown]
[im 1/31]
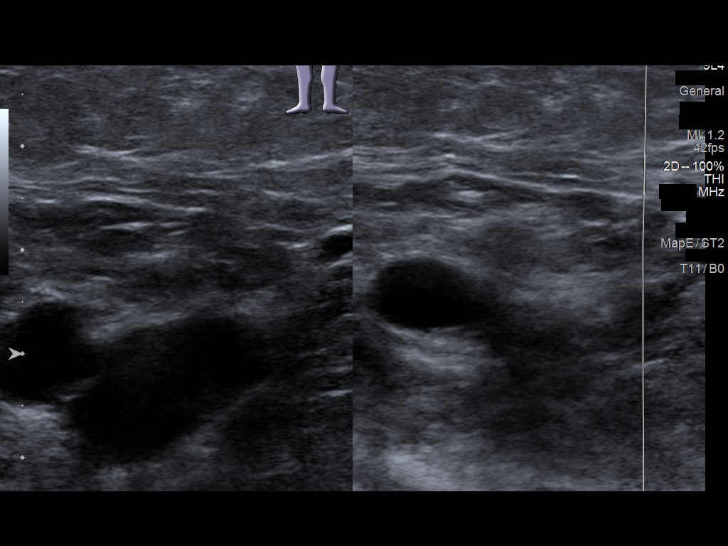
[im 3/31]
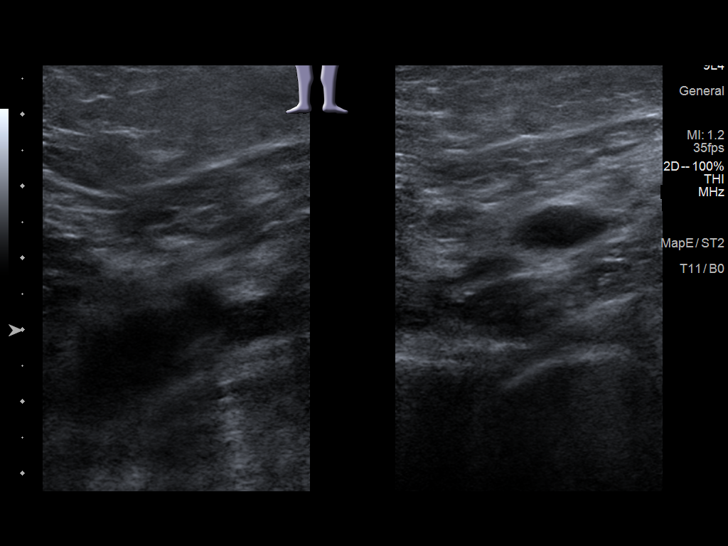
[im 6/31]
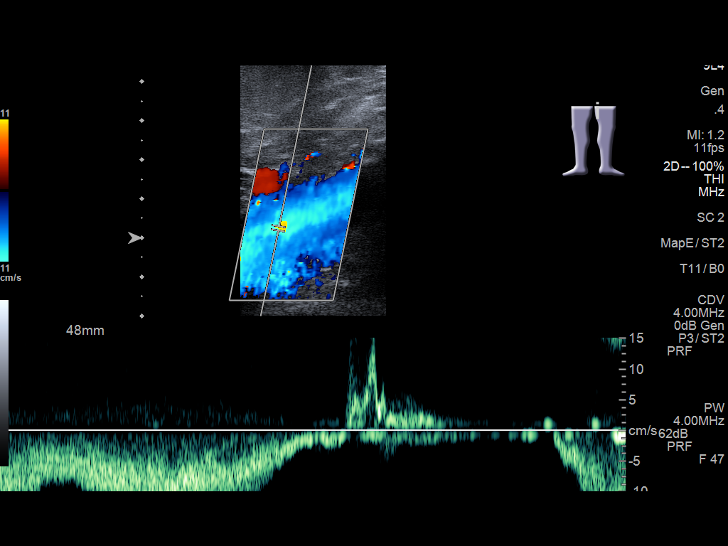
[im 8/31]
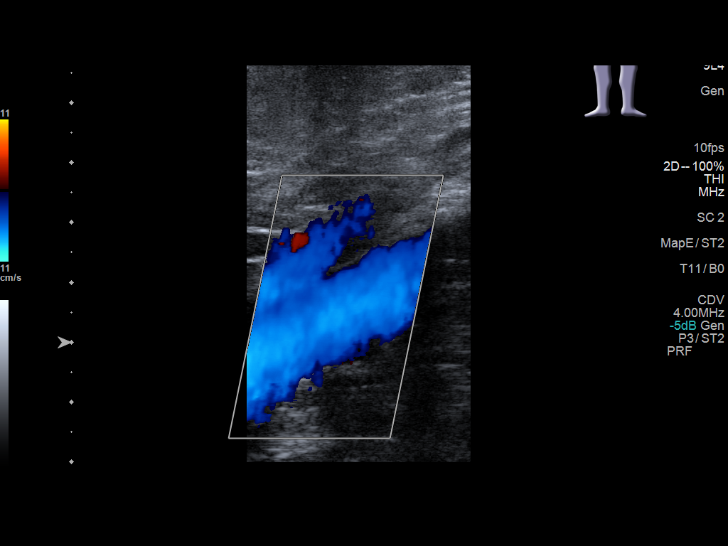
[im 10/31]
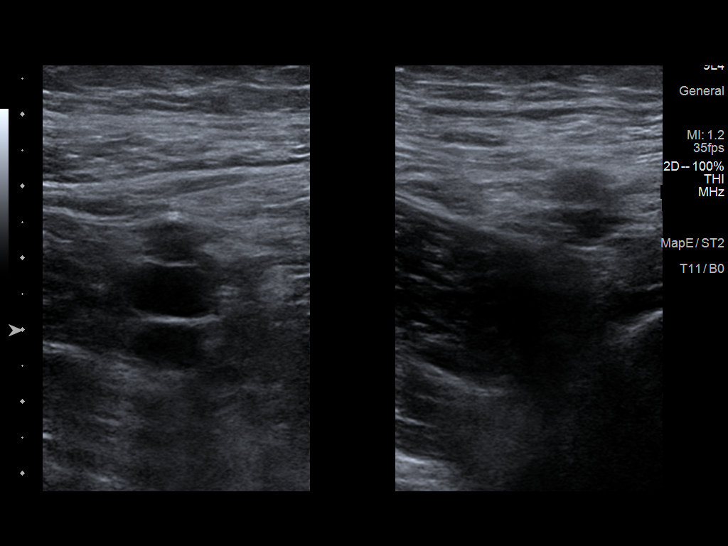
[im 12/31]
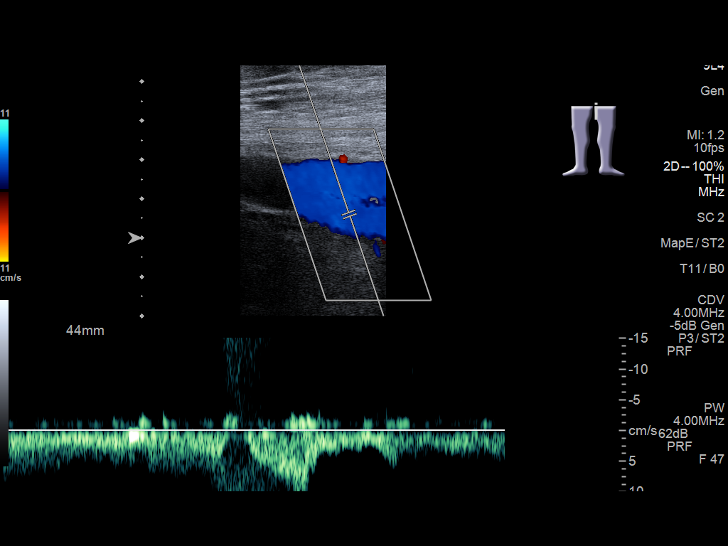
[im 15/31]
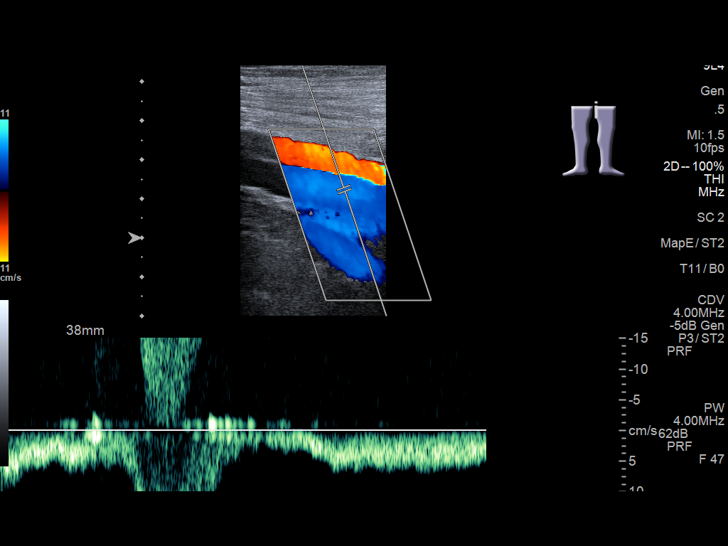
[im 16/31]
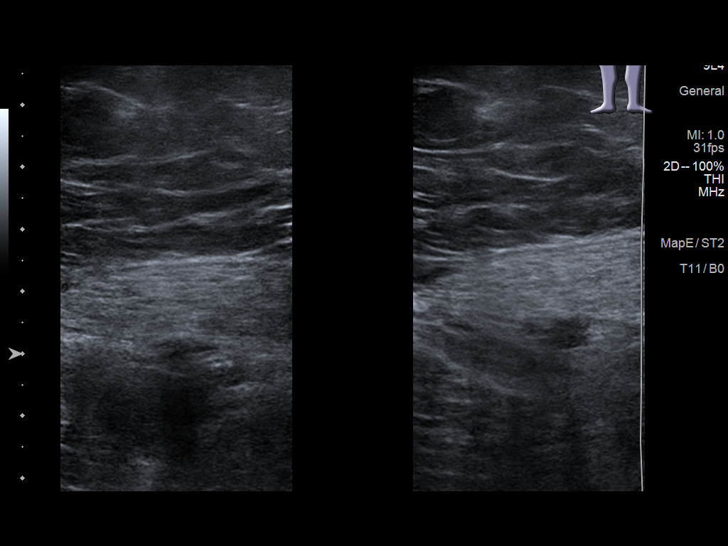
[im 19/31]
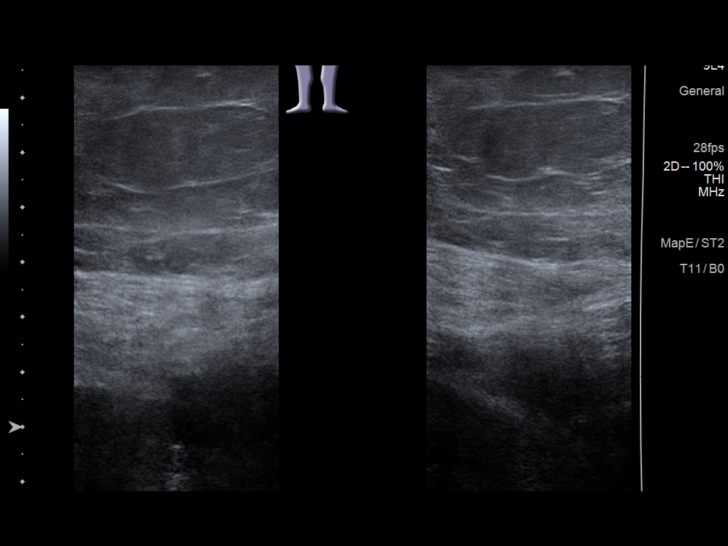
[im 21/31]
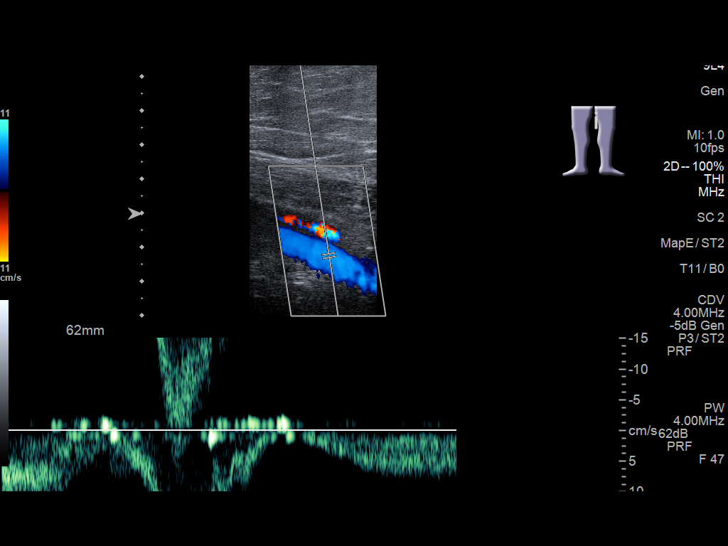
[im 24/31]
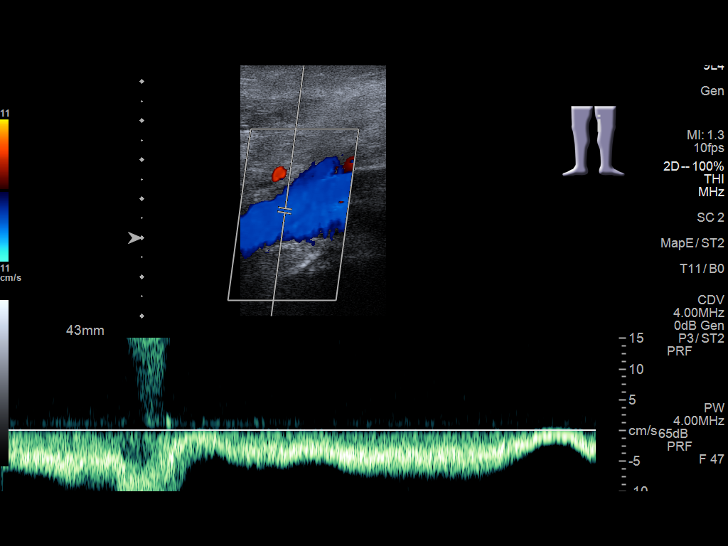
[im 25/31]
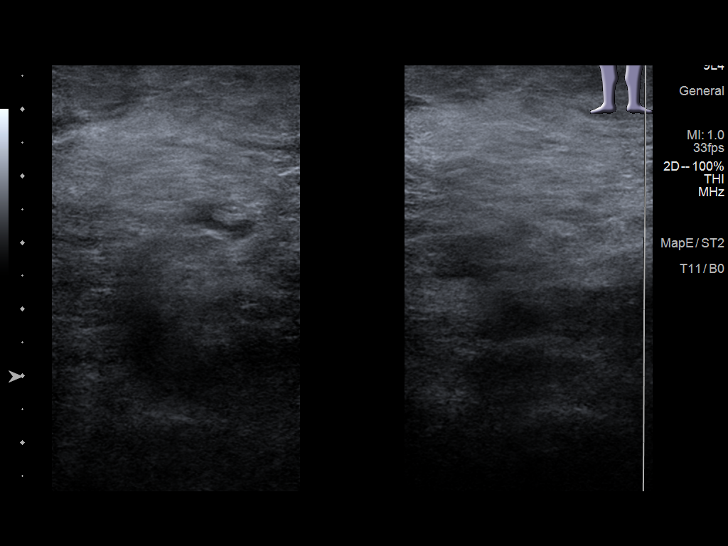
[im 28/31]
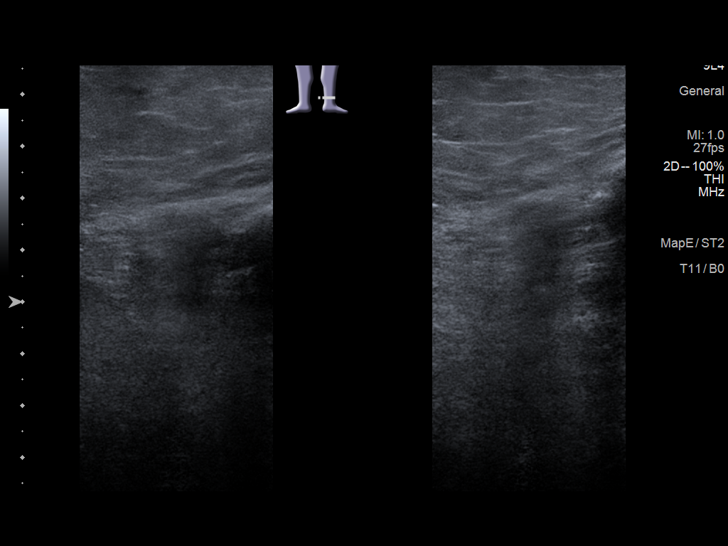
[im 31/31]
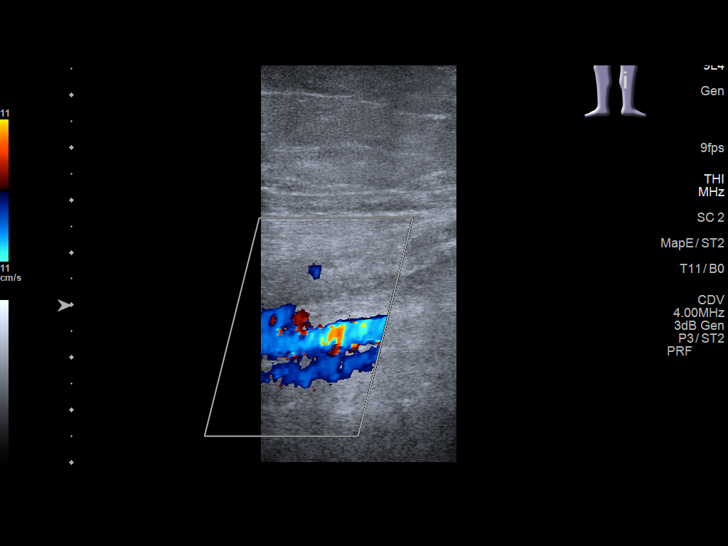

[14 of 24 positions shown; findings below may reference images not displayed]

FINDINGS: VENOUS

Normal compressibility of the common femoral, superficial femoral,
and popliteal veins, as well as the visualized calf veins.
Visualized portions of profunda femoral vein and great saphenous
vein unremarkable. No filling defects to suggest DVT on grayscale or
color Doppler imaging. Doppler waveforms show normal direction of
venous flow, normal respiratory plasticity and response to
augmentation.

Limited views of the contralateral common femoral vein are
unremarkable.

OTHER

None.

Limitations: none
IMPRESSION: Negative.

## 2022-01-28 DIAGNOSIS — M25562 Pain in left knee: Secondary | ICD-10-CM | POA: Diagnosis not present

## 2022-01-28 DIAGNOSIS — M25662 Stiffness of left knee, not elsewhere classified: Secondary | ICD-10-CM | POA: Diagnosis not present

## 2022-01-28 DIAGNOSIS — R2689 Other abnormalities of gait and mobility: Secondary | ICD-10-CM | POA: Diagnosis not present

## 2022-01-30 DIAGNOSIS — S83511D Sprain of anterior cruciate ligament of right knee, subsequent encounter: Secondary | ICD-10-CM | POA: Diagnosis not present

## 2022-01-30 DIAGNOSIS — S83512D Sprain of anterior cruciate ligament of left knee, subsequent encounter: Secondary | ICD-10-CM | POA: Diagnosis not present

## 2022-02-11 DIAGNOSIS — M25662 Stiffness of left knee, not elsewhere classified: Secondary | ICD-10-CM | POA: Diagnosis not present

## 2022-02-11 DIAGNOSIS — M25562 Pain in left knee: Secondary | ICD-10-CM | POA: Diagnosis not present

## 2022-02-11 DIAGNOSIS — R2689 Other abnormalities of gait and mobility: Secondary | ICD-10-CM | POA: Diagnosis not present

## 2022-02-18 DIAGNOSIS — M25662 Stiffness of left knee, not elsewhere classified: Secondary | ICD-10-CM | POA: Diagnosis not present

## 2022-02-18 DIAGNOSIS — M25562 Pain in left knee: Secondary | ICD-10-CM | POA: Diagnosis not present

## 2022-02-18 DIAGNOSIS — R2689 Other abnormalities of gait and mobility: Secondary | ICD-10-CM | POA: Diagnosis not present

## 2022-02-22 DIAGNOSIS — R2689 Other abnormalities of gait and mobility: Secondary | ICD-10-CM | POA: Diagnosis not present

## 2022-02-22 DIAGNOSIS — M25562 Pain in left knee: Secondary | ICD-10-CM | POA: Diagnosis not present

## 2022-02-22 DIAGNOSIS — M25662 Stiffness of left knee, not elsewhere classified: Secondary | ICD-10-CM | POA: Diagnosis not present

## 2022-03-01 DIAGNOSIS — M25562 Pain in left knee: Secondary | ICD-10-CM | POA: Diagnosis not present

## 2022-03-01 DIAGNOSIS — R2689 Other abnormalities of gait and mobility: Secondary | ICD-10-CM | POA: Diagnosis not present

## 2022-03-01 DIAGNOSIS — M25662 Stiffness of left knee, not elsewhere classified: Secondary | ICD-10-CM | POA: Diagnosis not present

## 2022-03-04 DIAGNOSIS — M25662 Stiffness of left knee, not elsewhere classified: Secondary | ICD-10-CM | POA: Diagnosis not present

## 2022-03-04 DIAGNOSIS — M25562 Pain in left knee: Secondary | ICD-10-CM | POA: Diagnosis not present

## 2022-03-04 DIAGNOSIS — R2689 Other abnormalities of gait and mobility: Secondary | ICD-10-CM | POA: Diagnosis not present

## 2022-03-12 ENCOUNTER — Other Ambulatory Visit: Payer: Self-pay

## 2022-03-12 DIAGNOSIS — F331 Major depressive disorder, recurrent, moderate: Secondary | ICD-10-CM | POA: Diagnosis not present

## 2022-03-12 DIAGNOSIS — K219 Gastro-esophageal reflux disease without esophagitis: Secondary | ICD-10-CM | POA: Diagnosis not present

## 2022-03-12 DIAGNOSIS — Z6841 Body Mass Index (BMI) 40.0 and over, adult: Secondary | ICD-10-CM | POA: Diagnosis not present

## 2022-03-12 DIAGNOSIS — E039 Hypothyroidism, unspecified: Secondary | ICD-10-CM | POA: Diagnosis not present

## 2022-03-12 DIAGNOSIS — I10 Essential (primary) hypertension: Secondary | ICD-10-CM | POA: Diagnosis not present

## 2022-03-12 MED ORDER — PHENTERMINE HCL 37.5 MG PO TABS
ORAL_TABLET | ORAL | 2 refills | Status: AC
  Filled 2022-03-12: qty 30, 30d supply, fill #0

## 2022-03-12 MED ORDER — BUPROPION HCL ER (XL) 300 MG PO TB24
ORAL_TABLET | ORAL | 3 refills | Status: AC
  Filled 2022-03-12: qty 30, 30d supply, fill #0

## 2022-03-13 ENCOUNTER — Other Ambulatory Visit: Payer: Self-pay

## 2022-03-14 ENCOUNTER — Other Ambulatory Visit: Payer: Self-pay

## 2022-03-15 ENCOUNTER — Other Ambulatory Visit: Payer: Self-pay

## 2022-03-19 ENCOUNTER — Other Ambulatory Visit: Payer: Self-pay

## 2022-03-21 ENCOUNTER — Other Ambulatory Visit: Payer: Self-pay

## 2022-03-22 ENCOUNTER — Other Ambulatory Visit: Payer: Self-pay

## 2022-03-26 ENCOUNTER — Other Ambulatory Visit: Payer: Self-pay

## 2022-03-29 ENCOUNTER — Other Ambulatory Visit: Payer: Self-pay

## 2022-04-02 ENCOUNTER — Other Ambulatory Visit: Payer: Self-pay

## 2022-04-03 ENCOUNTER — Other Ambulatory Visit: Payer: Self-pay

## 2022-04-04 ENCOUNTER — Other Ambulatory Visit: Payer: Self-pay

## 2022-04-08 DIAGNOSIS — R2689 Other abnormalities of gait and mobility: Secondary | ICD-10-CM | POA: Diagnosis not present

## 2022-04-08 DIAGNOSIS — M25662 Stiffness of left knee, not elsewhere classified: Secondary | ICD-10-CM | POA: Diagnosis not present

## 2022-04-08 DIAGNOSIS — M25562 Pain in left knee: Secondary | ICD-10-CM | POA: Diagnosis not present

## 2022-04-19 DIAGNOSIS — R2689 Other abnormalities of gait and mobility: Secondary | ICD-10-CM | POA: Diagnosis not present

## 2022-04-19 DIAGNOSIS — M25562 Pain in left knee: Secondary | ICD-10-CM | POA: Diagnosis not present

## 2022-04-19 DIAGNOSIS — M25662 Stiffness of left knee, not elsewhere classified: Secondary | ICD-10-CM | POA: Diagnosis not present

## 2022-04-22 DIAGNOSIS — M25662 Stiffness of left knee, not elsewhere classified: Secondary | ICD-10-CM | POA: Diagnosis not present

## 2022-04-22 DIAGNOSIS — R2689 Other abnormalities of gait and mobility: Secondary | ICD-10-CM | POA: Diagnosis not present

## 2022-04-22 DIAGNOSIS — M25562 Pain in left knee: Secondary | ICD-10-CM | POA: Diagnosis not present

## 2022-04-24 ENCOUNTER — Other Ambulatory Visit: Payer: Self-pay

## 2022-04-24 DIAGNOSIS — S83512D Sprain of anterior cruciate ligament of left knee, subsequent encounter: Secondary | ICD-10-CM | POA: Diagnosis not present

## 2022-04-24 DIAGNOSIS — S83511D Sprain of anterior cruciate ligament of right knee, subsequent encounter: Secondary | ICD-10-CM | POA: Diagnosis not present

## 2022-04-24 MED ORDER — METHOCARBAMOL 500 MG PO TABS
ORAL_TABLET | ORAL | 1 refills | Status: AC
  Filled 2022-04-24: qty 90, 22d supply, fill #0

## 2022-04-25 DIAGNOSIS — R2689 Other abnormalities of gait and mobility: Secondary | ICD-10-CM | POA: Diagnosis not present

## 2022-04-25 DIAGNOSIS — M25662 Stiffness of left knee, not elsewhere classified: Secondary | ICD-10-CM | POA: Diagnosis not present

## 2022-04-25 DIAGNOSIS — M25562 Pain in left knee: Secondary | ICD-10-CM | POA: Diagnosis not present

## 2022-04-29 DIAGNOSIS — M25662 Stiffness of left knee, not elsewhere classified: Secondary | ICD-10-CM | POA: Diagnosis not present

## 2022-04-29 DIAGNOSIS — R2689 Other abnormalities of gait and mobility: Secondary | ICD-10-CM | POA: Diagnosis not present

## 2022-04-29 DIAGNOSIS — M25562 Pain in left knee: Secondary | ICD-10-CM | POA: Diagnosis not present

## 2024-10-01 NOTE — Telephone Encounter (Signed)
 Patient is requesting the following refill Requested Prescriptions   Pending Prescriptions Disp Refills   ergocalciferol  (DRISDOL ) 1,250 mcg (50,000 unit) capsule 12 capsule 0    Sig: Take 1 capsule (1,250 mcg total) by mouth once a week.    Recent Visits Date Type Provider Dept  07/08/24 Office Visit Silver, Powell Chol, FNP [facility]  Showing recent visits within past 365 days and meeting all other requirements Future Appointments Date Type Provider Dept  10/18/24 Appointment Silver, Powell Chol, FNP [facility]  Showing future appointments within next 365 days and meeting all other requirements    Labs: Vitamin D :  Vitamin D  Total (25OH) (ng/mL)  Date Value  08/02/2024 18.6 (L)

## 2024-11-10 NOTE — Progress Notes (Signed)
 [facility] [place] Name: Taylor [name] Date of Birth: Nov 11, 1985 MRN: [id number] Visit Date: 11/10/2024  Subjective:   Chief Complaint  Patient presents with   Biometric screening    Waist- 59    Taylor [name] 39 y.o. female here for Biometric Exam/Complete Physical Exam at their employer based clinic.  Patient's last CPE 10/18/2024  Patient's PCP: [facility]  PMHX/SX/FX reviewed with patient today. History of Present Illness Hypertension: Currently taking Lisinopril 5mg  daily. BP remains above goal today. Denies any history of MI or CVA.    Preventive Care Checklist Cervical Cancer Screening: 10/18/2024, negative Last eye exam: annually Last dental exam: q66m  Wellness Exercise: none Diet: none Mental Health/Social: stable  Today's PHQ-2 results: Patient Health Questionnaire-2 Score: 0 (11/10/24 1304) Today's PHQ-9 result:   PHQ-9 Question # 9   Interpretation: PHQ-2 Interpretation: Negative (None-minimal Depression Severity) (11/10/24 1304)   Depression Plan: Continue current psychiatric treatment plan   Immunization History  Administered Date(s) Administered   Hep B, Unspecified 09/17/2012   Influenza, Unspecified 07/24/2019, 08/26/2020   MMR 05/09/2007   Pfizer SARS-CoV-2 Primary Series 12+ yrs 05/22/2020, 06/19/2020   TDAP VACCINE (BOOSTRIX,ADACEL) 7Y+ 09/17/2012, 10/18/2024   Varicella SQ (VARIVAX) 1Y+ 09/17/2012     Social History[1] Family History[2] Surgical History[3]  Medical History[4]  Current Medications[5] Allergies[6]  Review of Systems  Constitutional:  Negative for activity change, appetite change, chills and fever.  HENT:  Negative for ear pain, hearing loss and sore throat.   Eyes:  Negative for visual disturbance.  Respiratory:  Negative for cough and shortness of breath.   Cardiovascular:  Negative for chest pain and palpitations.  Gastrointestinal:   Negative for abdominal pain, constipation and diarrhea.  Endocrine: Negative for cold intolerance, heat intolerance, polydipsia and polyphagia.  Genitourinary:  Negative for dysuria, frequency, menstrual problem and urgency.  Musculoskeletal:  Negative for arthralgias and myalgias.  Skin:  Negative for color change and rash.  Neurological:  Positive for headaches. Negative for dizziness, weakness and numbness.  Hematological:  Negative for adenopathy. Does not bruise/bleed easily.  Psychiatric/Behavioral:  Negative for dysphoric mood and sleep disturbance. The patient is not nervous/anxious.     Objective:  Vital Signs Vitals:   11/10/24 1306 11/10/24 1324 11/10/24 1349  BP: (!) 156/95 (!) 132/94 132/86  Pulse: 100    Resp: 18    Temp: 97 F (36.1 C)    TempSrc: Temporal    SpO2: 97%    Weight: (!) 163 kg (358 lb 6.4 oz)     Body mass index is 57.85 kg/m.  Physical Exam Vitals and nursing note reviewed.  Constitutional:      General: She is not in acute distress.    Appearance: Normal appearance. She is obese.  HENT:     Head: Normocephalic.     Right Ear: Tympanic membrane, ear canal and external ear normal.     Left Ear: Tympanic membrane, ear canal and external ear normal.     Nose: Nose normal.     Mouth/Throat:     Mouth: Mucous membranes are moist.     Pharynx: Oropharynx is clear. No posterior oropharyngeal erythema.  Eyes:     Extraocular Movements: Extraocular movements intact.     Conjunctiva/sclera: Conjunctivae normal.     Pupils: Pupils are equal, round, and reactive to light.  Neck:     Vascular: No carotid bruit.  Cardiovascular:     Rate and  Rhythm: Normal rate and regular rhythm.     Pulses: Normal pulses.     Heart sounds: Normal heart sounds.  Pulmonary:     Effort: Pulmonary effort is normal.     Breath sounds: Normal breath sounds. No wheezing, rhonchi or rales.  Abdominal:     General: Abdomen is flat.     Palpations: Abdomen is soft.      Tenderness: There is no abdominal tenderness.  Musculoskeletal:        General: No swelling, tenderness or deformity. Normal range of motion.     Cervical back: Normal range of motion and neck supple. No tenderness.  Skin:    General: Skin is warm and dry.     Findings: No lesion or rash.  Neurological:     General: No focal deficit present.     Mental Status: She is alert and oriented to person, place, and time. Mental status is at baseline.     Motor: No weakness.  Psychiatric:        Mood and Affect: Mood normal.        Behavior: Behavior normal.    Assessment/Plan:    Assessment & Plan Encounter for biometric screening Diabetes mellitus screening Screening for lipid disorders RTC for fasting labs- CBC, CMP, Lipid, A1c Orders:   CBC with Differential; Future   Comprehensive Metabolic Panel; Future   Hemoglobin A1C With Estimated Average Glucose; Future   Lipid Panel; Future  Essential hypertension Chronic, needs improvement. Increase Lisinopril to 10mg  daily. - Medication instructions and expectations reviewed, including potential side effects and black box warning.  DASH diet, weight loss advised. Follow up in 4 weeks or sooner PRN. Orders:   lisinopril (PRINIVIL) 10 mg tablet; Take 1 tablet (10 mg total) by mouth daily.  Class 3 severe obesity due to excess calories without serious comorbidity with body mass index (BMI) of 50.0 to 59.9 in adult (CMD) Patient is currently pending insurance approval for the duodenal switch surgery. Weight loss efforts advised.    Discussed physical exam findings with patient  Health Maintenance reviewed - vaccines declined by patient today Health Maintenance Due  Topic Date Due   HIV Screening  Never done   Hepatitis C Screening  Never done   Hepatitis B Vaccines (2 of 3 - 19+ 3-dose series) 10/15/2012   Varicella Vaccines (2 of 2 - 13+ 2-dose series) 10/15/2012    Counseling on preventative health and age appropriate  topics discussed/provided.  Immunizations are reviewed as noted. Healthy lifestyle principles reviewed and handout/AVS provided BMI < 30, unless other physical metrics or muscle mass accounted  -Sleep hygiene: Make sure your bedroom is quiet, dark and relaxing.  Avoid screen time when preparing for sleep.  Avoid caffeine after lunch.  Avoid large meals and alcohol before bedtime.  Manage time to allow for 7 to 9 hours of sleep nightly. -Stay up-to-date with recommended immunizations . -Avoid tobacco, vaping and excessive alcohol. -Exercise for at least 150 minutes of moderate-intensity aerobic activity or 75 minutes of vigorous-intensity aerobic activity per week -Prioritize a balanced diet rich in fruits, vegetables, whole grains, lean protein, and healthy fats, while limiting added sugar, saturated fat, and sodium, stay hydrated  -Practice safe sex -See your dentist every 6 months, your eye doctor yearly and your GYN yearly as scheduled. -Wear your seatbelt while in the car. -Sunscreen use. Self exam for skin cancer screening . -Vit D 1000-2000 iu daily -Care for your mental health. Stay connected with supportive relationships  Speech recognition software and DAX used for parts of this note.  Electronically signed: Mitzie Fellows, FNP 11/10/2024  2:19 PM      [1] Social History Socioeconomic History   Marital status: Married  Tobacco Use   Smoking status: Former    Types: Cigarettes   Smokeless tobacco: Never  Substance and Sexual Activity   Alcohol use: Not Currently    Comment: social   Drug use: Not Currently   Sexual activity: Yes    Partners: Male    Birth control/protection: I.U.D.   Social Drivers of Health   Living Situation: Low Risk (10/18/2024)   Received from [facility]   Housing    Within the past 12 months, have you ever stayed: outside, in a car, in a tent, in an overnight shelter, or temporarily in someone else's home(i.e.couch-surfing)?: No     Are you worried about losing your housing?: No  Food Insecurity: No Food Insecurity (10/18/2024)   Received from [facility]   Food vital sign    Within the past 12 months, you worried that your food would run out before you got money to buy more: Never true    Within the past 12 months, the food you bought just didn't last and you didn't have money to get more: Never true  Transportation Needs: No Transportation Needs (10/18/2024)   Received from [facility] - Transportation    Lack of Transportation (Medical): No    Lack of Transportation (Non-Medical): No  Utilities: Low Risk (10/18/2024)   Received from [facility]   Utilities    Within the past 12 months, have you been unable to get utilities(heat, electricity) when it was really needed?: No  Safety: Low Risk (11/10/2024)   Safety    How often does anyone, including family and friends, physically hurt you?: Never    How often does anyone, including family and friends, insult or talk down to you?: Never    How often does anyone, including family and friends, threaten you with harm?: Never    How often does anyone, including family and friends, scream or curse at you?: Never  Tobacco Use: Medium Risk (11/10/2024)   Patient History    Smoking Tobacco Use: Former    Smokeless Tobacco Use: Never  Depression: Not At Risk (11/10/2024)   PHQ-2    PHQ-2 Score: 0  Financial Resource Strain: Low Risk (10/18/2024)   [facility]   Overall Financial Resource Strain (CARDIA)    How hard is it for you to pay for the very basics like food, housing, medical care, and heating?: Not hard at all  [2] Family History Problem Relation Name Age of Onset   Skin cancer Mother Olam    Hypertension Mother Olam    Hypertension Father Alm    Arthritis Father Alm    Depression Father Alm    Heart attack Father Alm    Cancer Maternal Higinio Hurl    Cancer Maternal Grandmother Kelly    Early  death Maternal Grandfather Bertrum    Kidney disease Paternal Grandmother Marge    Cancer Paternal Grandmother Marge    Hypertension Paternal Grandmother Marge    Arthritis Paternal Grandmother Marge    Heart disease Paternal Futures Trader   [3] Past Surgical History: Procedure Laterality Date   ANTERIOR CRUCIATE LIGAMENT REPAIR Bilateral    CESAREAN SECTION, CLASSICAL     CHOLECYSTECTOMY     FRACTURE SURGERY  ankle   STOMACH SURGERY     Gastric sleeve  [4] Past Medical History: Diagnosis Date   Anemia    Anxiety    Depression    GERD (gastroesophageal reflux disease)    Hypertension    Hypothyroidism    Obesity   [5]  Current Outpatient Medications:    blunt needle, disposable 22 x 1 1/2  ndle, 1 Box by miscellaneous route once a week., Disp: 1 each, Rfl: 0   cholecalciferol 25 mcg (1,000 unit) cap, Take 1 each (25 mcg total) by mouth daily., Disp: 90 each, Rfl: 1   ferrous sulfate 325 mg (65 mg iron) tablet, Take 1 tablet (325 mg total) by mouth daily before breakfast., Disp: 90 tablet, Rfl: 1   FLUoxetine  (PROzac ) 20 mg capsule, Take 2 capsules (40 mg total) by mouth daily., Disp: 180 capsule, Rfl: 3   levonorgestreL  (MIRENA ) 21 mcg/24 hr (8 yrs) 52 mg IUD, 1 each by intrauterine route., Disp: , Rfl:    levothyroxine  (SYNTHROID ) 25 mcg tablet, Take 12.5 mcg by mouth., Disp: , Rfl:    methocarbamoL  (ROBAXIN ) 500 mg tablet, 500 mg., Disp: , Rfl:    pantoprazole  (PROTONIX ) 40 mg EC tablet, Take 1 tablet (40 mg total) by mouth in the morning and 1 tablet (40 mg total) in the evening., Disp: 60 tablet, Rfl: 2   lisinopril (PRINIVIL) 10 mg tablet, Take 1 tablet (10 mg total) by mouth daily., Disp: 90 tablet, Rfl: 1 [6] Allergies Allergen Reactions   Cephalexin Anaphylaxis, Other (See Comments) and Swelling    Throat swelling, makes me quit breathing

## 2024-11-11 NOTE — Progress Notes (Signed)
 [facility] [place] Name: Taylor [name] Date of Birth: 1985-12-12 MRN: [id number] Visit Date: 11/11/2024  Chief Complaint  Patient presents with   Sick Visit    Runny nose, cough, congestion, body aches, Headaches x 1 day. Taking Tylenol      HPI Taylor [name] is a 39 y.o.female presents at their employer based clinic for a sick visit. History of Present Illness The patient is a 38 year old female who presents with complaints of runny nose, cough, congestion, body aches, and headache.  Symptoms began last night and have progressively worsened today. She reports rhinorrhea, cough, nasal congestion, sore throat, headache, and body aches. There has been no fever noted. Tylenol  has been used for symptom relief. She has been exposed to numerous colleagues whom have influenza. There is no known exposure to COVID-19 or streptococcal infection.   She admits to not taking any of her prescription medications this morning, including her hypertension medication which was just increased at yesterdays office visit.     ROS Review of systems negative except as noted in HPI  Current Medications[1] Allergies[2] Medical History[3] Surgical History[4] Family History[5] Social Connections: Not on file     Behavioral Health Screening  Patient Health Questionnaire-2 Score: 0 (11/10/2024  1:04 PM)      Patient's Depression screening/score = Negative    Depression Plan: Normal/Negative Screening  OBJECTIVE Vitals:   11/11/24 1304  BP: (!) 146/92  Pulse: 81  Resp: 17  Temp: 97 F (36.1 C)  TempSrc: Temporal  SpO2: 97%  Weight: (!) 163 kg (358 lb 9.6 oz)   No LMP recorded. Physical Exam Vitals and nursing note reviewed.  Constitutional:      General: She is not in acute distress.    Appearance: Normal appearance. She is ill-appearing. She is not toxic-appearing.  HENT:     Head: Normocephalic.     Nose: Congestion and rhinorrhea  present.  Eyes:     General:        Right eye: No discharge.        Left eye: No discharge.     Conjunctiva/sclera: Conjunctivae normal.     Pupils: Pupils are equal, round, and reactive to light.  Cardiovascular:     Rate and Rhythm: Normal rate and regular rhythm.     Pulses: Normal pulses.     Heart sounds: Normal heart sounds.  Pulmonary:     Effort: Pulmonary effort is normal.     Breath sounds: Normal breath sounds.  Skin:    General: Skin is warm and dry.  Neurological:     Mental Status: She is alert.     Assessment & Plan Flu-like symptoms Exposure to influenza Patient has been advised that POC Flu A/B are negative. However,given her symptom profile and sick contact exposure will treat for suspect influenza with Tamiflu 75mg  BID for 5 days. Medication(s) dispensed from onsite pharmacy during visit today. Reviewed medications instructions and medication warnings with patient. Questions answered. Advised patient to call clinic with any follow up questions regarding medication(s) dispensed.   I have recommended OTC medications as needed for symptoms. Tylenol  and/or Ibuprofen  for fever and body aches.  OTC Delsym is good for cough and may be used without causing sedation.  Throat lozenges (such as Benzocaine lozenges) and hot tea with honey for cough and sore throat.   OTC pseudoephedrine (Sudafed) may be used for congestion, but monitor blood pressure.  Nasal saline is non-medicated and may be used several  times a day to help clear mucous from the sinuses.  Hot, steamy showers and a humidifier in the bedroom may also be helpful.  Recommend increased hydration.    If symptoms are not improving within a few days, or if they worsen, pt advised to follow up here or with PCP.  Advised that if develops chest pain or shortness of breath, should go to the ED.   Pt should remain out of work/school until fever free without fever reducing meds x 24 hours.    Orders:   POC Rapid  Influenza A & B Antigen   oseltamivir (TAMIFLU) 75 mg capsule; Take 1 capsule (75 mg total) by mouth 2 (two) times a day for 5 days.  Encounter for biometric screening Diabetes mellitus screening Screening for lipid disorders Draw fasting labs today.   Orders:   CBC with Differential   Comprehensive Metabolic Panel   Hemoglobin A1C With Estimated Average Glucose   Lipid Panel    Recent Results (from the past 24 hours)  POC Rapid Influenza A & B Antigen   Collection Time: 11/11/24  1:13 PM  Result Value Ref Range   Flu A Negative Negative   Flu B Negative Negative   Internal Control Acceptable    Kit/Device Lot # [id number]    Kit/Device Expiration Date [id number]      Speech recognition software and DAX used for parts of this note   Electronically signed: Mitzie Fellows, FNP 11/11/2024  1:33 PM      [1] Current Outpatient Medications  Medication Sig Dispense Refill   blunt needle, disposable 22 x 1 1/2  ndle 1 Box by miscellaneous route once a week. 1 each 0   cholecalciferol 25 mcg (1,000 unit) cap Take 1 each (25 mcg total) by mouth daily. 90 each 1   ferrous sulfate 325 mg (65 mg iron) tablet Take 1 tablet (325 mg total) by mouth daily before breakfast. 90 tablet 1   FLUoxetine  (PROzac ) 20 mg capsule Take 2 capsules (40 mg total) by mouth daily. 180 capsule 3   levonorgestreL  (MIRENA ) 21 mcg/24 hr (8 yrs) 52 mg IUD 1 each by intrauterine route.     levothyroxine  (SYNTHROID ) 25 mcg tablet Take 12.5 mcg by mouth.     lisinopril (PRINIVIL) 10 mg tablet Take 1 tablet (10 mg total) by mouth daily. 90 tablet 1   methocarbamoL  (ROBAXIN ) 500 mg tablet 500 mg.     pantoprazole  (PROTONIX ) 40 mg EC tablet Take 1 tablet (40 mg total) by mouth in the morning and 1 tablet (40 mg total) in the evening. 60 tablet 2   oseltamivir (TAMIFLU) 75 mg capsule Take 1 capsule (75 mg total) by mouth 2 (two) times a day for 5 days. 10 capsule 0   No current facility-administered  medications for this visit.  [2] Allergies Allergen Reactions   Cephalexin Anaphylaxis, Other (See Comments) and Swelling    Throat swelling, makes me quit breathing  [3] Past Medical History: Diagnosis Date   Anemia    Anxiety    Depression    GERD (gastroesophageal reflux disease)    Hypertension    Hypothyroidism    Obesity   [4] Past Surgical History: Procedure Laterality Date   ANTERIOR CRUCIATE LIGAMENT REPAIR Bilateral    CESAREAN SECTION, CLASSICAL     CHOLECYSTECTOMY     FRACTURE SURGERY     ankle   STOMACH SURGERY     Gastric sleeve  [5] Family History Problem Relation Name Age  of Onset   Skin cancer Mother Olam    Hypertension Mother Olam    Hypertension Father Alm    Arthritis Father Alm    Depression Father Alm    Heart attack Father Alm    Cancer Maternal Higinio Hurl    Cancer Maternal Grandmother Kelly    Early death Maternal Apolinar Forte    Kidney disease Paternal Grandmother Marge    Cancer Paternal Grandmother Marge    Hypertension Paternal Grandmother Marge    Arthritis Paternal Grandmother Marge    Heart disease Paternal Grandmother Linzy
